# Patient Record
Sex: Female | Born: 1945 | Race: Black or African American | Hispanic: No | Smoking: Former smoker
Health system: Southern US, Community
[De-identification: ages and names within clinical notes are randomized; demographics above are authoritative.]

## PROBLEM LIST (undated history)

## (undated) DIAGNOSIS — IMO0001 Reserved for inherently not codable concepts without codable children: Secondary | ICD-10-CM

## (undated) DIAGNOSIS — D649 Anemia, unspecified: Secondary | ICD-10-CM

## (undated) DIAGNOSIS — E119 Type 2 diabetes mellitus without complications: Secondary | ICD-10-CM

## (undated) DIAGNOSIS — E559 Vitamin D deficiency, unspecified: Secondary | ICD-10-CM

## (undated) DIAGNOSIS — I10 Essential (primary) hypertension: Secondary | ICD-10-CM

## (undated) HISTORY — DX: Reserved for inherently not codable concepts without codable children: IMO0001

## (undated) HISTORY — PX: OTHER SURGICAL HISTORY: SHX169

## (undated) HISTORY — PX: BACK SURGERY: SHX140

## (undated) HISTORY — DX: Vitamin D deficiency, unspecified: E55.9

---

## 2010-10-23 ENCOUNTER — Encounter: Payer: Self-pay | Admitting: Family Medicine

## 2010-10-23 ENCOUNTER — Ambulatory Visit (INDEPENDENT_AMBULATORY_CARE_PROVIDER_SITE_OTHER): Payer: Medicare Other | Admitting: Family Medicine

## 2010-10-23 VITALS — BP 162/72 | HR 73 | Ht 65.0 in | Wt 165.0 lb

## 2010-10-23 DIAGNOSIS — M25552 Pain in left hip: Secondary | ICD-10-CM

## 2010-10-23 DIAGNOSIS — M25551 Pain in right hip: Secondary | ICD-10-CM

## 2010-10-23 DIAGNOSIS — M48062 Spinal stenosis, lumbar region with neurogenic claudication: Secondary | ICD-10-CM

## 2010-10-23 DIAGNOSIS — M25559 Pain in unspecified hip: Secondary | ICD-10-CM

## 2010-10-23 DIAGNOSIS — M549 Dorsalgia, unspecified: Secondary | ICD-10-CM

## 2010-10-23 MED ORDER — OXYCODONE HCL 15 MG PO TABS: ORAL_TABLET | ORAL | Status: DC

## 2010-10-23 NOTE — Patient Instructions (Signed)
Your appt is going to be: Monday, August 13th at [id number] Dr Ancil Linsey [id number] [place] [id number] [phone] MAKE SURE YOU BRINGS YOUR MRI DISC OR THEY WILL NOT SEE YOU Also bring insurance info along with your medicines

## 2010-10-24 ENCOUNTER — Encounter: Payer: Self-pay | Admitting: Family Medicine

## 2010-10-24 NOTE — Progress Notes (Signed)
  Subjective:    Patient ID: Barbara Yu, female    DOB: 1945-06-18, 65 y.o.   MRN: [id number]  HPI Here for evaluation of hip and  low back pain, worse on right--radiates into right hip. Has had hip and back mri and brings her radiology discs.  Pain in back longstanding and had back surgery  In 2005. (interbody fusion). That relieved her pain for a long time but in last 2 year increase in low back pain. In last 6 months is having significant pain if she walks more than 5-10 minutes--feels she has to sit down as legs do not "work" as well and pain is intense. Pain starts in low back and and is congruent with bilateral( but usually worse RIGHt) hip pain,   Aching in quality with sharp shooting pain into right thighNo incontinence. Is unable to do her ADLs secondary to inability to stand or walk > 10 minutes at a time. When she sits down, it takes 25 minutes or so for pain to improve enough for her to resume activity. She is also having a lot of difficulty sleeping.  Bilateral but Right> left  hip is also painful and she is not sure if this is coming from back or a separate issue. No hip injury or surgegry. Hip pain worse if she is lying down. No giving way of her legs.   PERTINENT  PMH / PSH: Lumbar fusion with interbody L4-5.  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 Presence of hardware L45 Review of Systems Pertinent review of systems: negative for fever or unusual weight change. Denies urinary or fecal incontinence.  See HPI for additional ROS.    Objective:   Physical Exam GENERAL : WDWN NAD Limited abillity to sit without pain  BACK:  Nontender to palpation or percussion. No obvious defect. Limited flexion at hips to about 25 degrees. SLR  positive on right HIP: limited IR on right c/w left. Painful IR right. TTP greater trochanteric bursa. Full strength B lower extremity at hip flexors / knee flexors / knee extensors  DTR s0- 1+ B= knee GAIT: antalgic. Turns with several steps needed rather than with single step.No trendelenburg  INJECTION: Patient was given informed consent, signed copy in the chart. Appropriate time out was taken. Area prepped and draped in usual sterile fashion. 1 cc of kenalog plus  4 cc of lidocaine was injected into the right greater trochanteric bursa using a(n) lateral approach. The patient tolerated the procedure well. There were no complications. Post procedure instructions were given.    Assessment & Plan:  1: low back pain, hx prior fusion. Concerning for worsening spinal stenosis. We discussed and she  Agrees to see [facility] for further eval and management 2. Bilateral but R>L hip pain. From positive result of lidocaine injection today I suspect thre  Is some component of bursitis. She also has some limits to IR so arthritic changes (possibly a labral issue) are a component as well. I wonder if most of this is coming from her back. For pain I will give her some oxycodone. Refer her to [facility] of her choice.

## 2010-11-06 ENCOUNTER — Other Ambulatory Visit: Payer: Self-pay | Admitting: *Deleted

## 2010-11-06 MED ORDER — MORPHINE SULFATE 30 MG PO TB12: 30.0000 mg | ORAL_TABLET | Freq: Four times a day (QID) | ORAL | Status: DC

## 2010-11-06 MED ORDER — MORPHINE SULFATE 30 MG PO TB12: ORAL_TABLET | ORAL | Status: DC

## 2011-02-21 LAB — HM COLONOSCOPY

## 2011-02-27 LAB — HM COLONOSCOPY

## 2012-01-02 ENCOUNTER — Ambulatory Visit (INDEPENDENT_AMBULATORY_CARE_PROVIDER_SITE_OTHER): Payer: Medicare Other | Admitting: Family Medicine

## 2012-01-02 ENCOUNTER — Encounter: Payer: Self-pay | Admitting: Family Medicine

## 2012-01-02 VITALS — BP 140/69 | HR 85 | Temp 98.0°F | Ht 65.0 in | Wt 163.0 lb

## 2012-01-02 DIAGNOSIS — M549 Dorsalgia, unspecified: Secondary | ICD-10-CM

## 2012-01-02 DIAGNOSIS — G8929 Other chronic pain: Secondary | ICD-10-CM

## 2012-01-02 DIAGNOSIS — M25552 Pain in left hip: Secondary | ICD-10-CM

## 2012-01-02 DIAGNOSIS — M25559 Pain in unspecified hip: Secondary | ICD-10-CM

## 2012-01-02 DIAGNOSIS — M25551 Pain in right hip: Secondary | ICD-10-CM

## 2012-01-03 DIAGNOSIS — M25551 Pain in right hip: Secondary | ICD-10-CM | POA: Insufficient documentation

## 2012-01-03 DIAGNOSIS — M160 Bilateral primary osteoarthritis of hip: Secondary | ICD-10-CM

## 2012-01-03 DIAGNOSIS — M961 Postlaminectomy syndrome, not elsewhere classified: Secondary | ICD-10-CM

## 2012-01-03 HISTORY — DX: Pain in right hip: M25.551

## 2012-01-03 HISTORY — DX: Postlaminectomy syndrome, not elsewhere classified: M96.1

## 2012-01-03 HISTORY — DX: Bilateral primary osteoarthritis of hip: M16.0

## 2012-01-03 NOTE — Progress Notes (Signed)
  Subjective:    Patient ID: Barbara Yu, female    DOB: 1945-09-08, 66 y.o.   MRN: [id number]  HPI Plan if worsening bilateral but right greater than left hip pain. Has been seen by orthopedics and they did MRI of both hips. Report shows that she has a partial posterior labral tear on the right. The injection I gave her over a year ago really helped for many months. She would like to consider bilateral injections today.  Chronic low back and hip pain. It has been difficult for her to determine what part of this is coming from her back in one part is coming from her hips. She is being followed by pain clinic right now. She would like to consider some names of people for second opinion on both her back and her hip.   Review of Systems Denies leg numbness, leg weakness, incontinence. Denies unusual weight change, fever.    Objective:   Physical Exam  GENERAL: Well-developed female no acute distress vital signs are reviewed HIPS: Internal rotation normal bilaterally external rotation mildly limited on the right. Axial loading of the posterior labrum increases her pain on the right. She is tender to palpation bilaterally right greater than left over the greater trochanteric bursa.  INJECTION: Patient was given informed consent, signed copy in the chart. Appropriate time out was taken. Area prepped and draped in usual sterile fashion. One cc of methylprednisolone 40 mg/ml plus  3 cc of 1% lidocaine without epinephrine was injected into the bilateral greater trochanteric bursa using a(n) perpendicular approach. The patient tolerated the procedure well. There were no complications. Post procedure instructions were given.       Assessment & Plan:  #1. Hip pain. I gave her bilateral corticosteroid injections in the greater trochanteric bursa today. I do think this is the source of part of her pain. Am unclear whether not the labral issue identified on MRI is contributing to the right hip pain or  not. I did give her the names of several people to see. #2. Low back pain that is chronic. I reviewed her MRI with her. She has seen one surgeon would like a second opinion so I gave her some names. She will followup when necessary

## 2012-02-25 ENCOUNTER — Other Ambulatory Visit: Payer: Self-pay | Admitting: Family Medicine

## 2012-02-25 DIAGNOSIS — M25552 Pain in left hip: Secondary | ICD-10-CM

## 2012-02-25 DIAGNOSIS — M25551 Pain in right hip: Secondary | ICD-10-CM

## 2012-02-25 DIAGNOSIS — M549 Dorsalgia, unspecified: Secondary | ICD-10-CM

## 2012-02-25 DIAGNOSIS — G8929 Other chronic pain: Secondary | ICD-10-CM

## 2012-02-26 ENCOUNTER — Other Ambulatory Visit: Payer: Self-pay | Admitting: Family Medicine

## 2012-02-28 ENCOUNTER — Other Ambulatory Visit: Payer: Self-pay | Admitting: Family Medicine

## 2012-02-28 DIAGNOSIS — M25552 Pain in left hip: Secondary | ICD-10-CM

## 2012-02-28 DIAGNOSIS — M25551 Pain in right hip: Secondary | ICD-10-CM

## 2012-02-28 DIAGNOSIS — M4800 Spinal stenosis, site unspecified: Secondary | ICD-10-CM

## 2012-06-03 DIAGNOSIS — E039 Hypothyroidism, unspecified: Secondary | ICD-10-CM

## 2012-06-03 DIAGNOSIS — K219 Gastro-esophageal reflux disease without esophagitis: Secondary | ICD-10-CM | POA: Insufficient documentation

## 2012-06-03 DIAGNOSIS — E118 Type 2 diabetes mellitus with unspecified complications: Secondary | ICD-10-CM | POA: Insufficient documentation

## 2012-06-03 DIAGNOSIS — E119 Type 2 diabetes mellitus without complications: Secondary | ICD-10-CM | POA: Insufficient documentation

## 2012-06-03 DIAGNOSIS — I10 Essential (primary) hypertension: Secondary | ICD-10-CM | POA: Insufficient documentation

## 2012-06-03 DIAGNOSIS — E1122 Type 2 diabetes mellitus with diabetic chronic kidney disease: Secondary | ICD-10-CM

## 2012-06-03 DIAGNOSIS — N184 Chronic kidney disease, stage 4 (severe): Secondary | ICD-10-CM

## 2012-06-03 HISTORY — DX: Type 2 diabetes mellitus with diabetic chronic kidney disease: E11.22

## 2012-06-03 HISTORY — DX: Hypothyroidism, unspecified: E03.9

## 2012-06-03 HISTORY — DX: Type 2 diabetes mellitus with diabetic chronic kidney disease: N18.4

## 2012-06-03 HISTORY — DX: Gastro-esophageal reflux disease without esophagitis: K21.9

## 2012-06-03 HISTORY — DX: Essential (primary) hypertension: I10

## 2012-06-09 DIAGNOSIS — IMO0001 Reserved for inherently not codable concepts without codable children: Secondary | ICD-10-CM

## 2012-06-09 HISTORY — DX: Reserved for inherently not codable concepts without codable children: IMO0001

## 2012-06-12 DIAGNOSIS — R202 Paresthesia of skin: Secondary | ICD-10-CM

## 2012-06-12 DIAGNOSIS — D62 Acute posthemorrhagic anemia: Secondary | ICD-10-CM | POA: Insufficient documentation

## 2012-06-12 DIAGNOSIS — M47816 Spondylosis without myelopathy or radiculopathy, lumbar region: Secondary | ICD-10-CM | POA: Insufficient documentation

## 2012-06-12 DIAGNOSIS — R531 Weakness: Secondary | ICD-10-CM | POA: Insufficient documentation

## 2012-06-12 HISTORY — DX: Weakness: R53.1

## 2012-06-12 HISTORY — DX: Paresthesia of skin: R20.2

## 2012-06-13 ENCOUNTER — Encounter: Payer: Self-pay | Admitting: Family Medicine

## 2012-06-13 NOTE — Progress Notes (Signed)
Patient ID: Barbara Yu, female   DOB: 1945/05/01, 67 y.o.   MRN: [id number] Recd records from [facility]--normal cardiac stress test, other labs normal with notables : Platelts 126 Creatinine1.6 and glucose 150

## 2012-06-24 ENCOUNTER — Encounter: Payer: Self-pay | Admitting: Family Medicine

## 2012-06-24 DIAGNOSIS — G8929 Other chronic pain: Secondary | ICD-10-CM

## 2012-06-24 NOTE — Progress Notes (Signed)
Patient ID: Barbara Yu, female   DOB: 01-30-1946, 67 y.o.   MRN: [id number] Back surgery by Dr Prince Rome at [facility] 06/11/2012.  L3-S1 PSF, L3-4 and L5-S1 bilateral decompression wit stryker, s/p PLIF stryker

## 2012-10-16 DIAGNOSIS — M5137 Other intervertebral disc degeneration, lumbosacral region: Secondary | ICD-10-CM | POA: Insufficient documentation

## 2012-10-16 DIAGNOSIS — M51379 Other intervertebral disc degeneration, lumbosacral region without mention of lumbar back pain or lower extremity pain: Secondary | ICD-10-CM | POA: Insufficient documentation

## 2012-10-16 DIAGNOSIS — Z981 Arthrodesis status: Secondary | ICD-10-CM | POA: Insufficient documentation

## 2012-10-16 HISTORY — DX: Arthrodesis status: Z98.1

## 2013-02-12 DIAGNOSIS — D696 Thrombocytopenia, unspecified: Secondary | ICD-10-CM

## 2013-02-12 HISTORY — DX: Thrombocytopenia, unspecified: D69.6

## 2014-06-24 DIAGNOSIS — E559 Vitamin D deficiency, unspecified: Secondary | ICD-10-CM | POA: Insufficient documentation

## 2015-09-30 LAB — HM DEXA SCAN

## 2016-04-13 DIAGNOSIS — M5417 Radiculopathy, lumbosacral region: Secondary | ICD-10-CM | POA: Insufficient documentation

## 2016-05-21 DIAGNOSIS — J209 Acute bronchitis, unspecified: Secondary | ICD-10-CM | POA: Insufficient documentation

## 2016-06-26 LAB — BASIC METABOLIC PANEL
BUN: 18 (ref 4–21)
CREATININE: 1.5 — AB (ref 0.5–1.1)
GLUCOSE: 179
SODIUM: 138 (ref 137–147)

## 2016-06-26 LAB — HEPATIC FUNCTION PANEL
ALK PHOS: 50 (ref 25–125)
ALT: 67 — AB (ref 7–35)
AST: 113 — AB (ref 13–35)
Bilirubin, Total: 0.3

## 2016-06-28 DIAGNOSIS — S32009K Unspecified fracture of unspecified lumbar vertebra, subsequent encounter for fracture with nonunion: Secondary | ICD-10-CM

## 2016-06-28 HISTORY — DX: Unspecified fracture of unspecified lumbar vertebra, subsequent encounter for fracture with nonunion: S32.009K

## 2016-09-27 ENCOUNTER — Ambulatory Visit: Payer: Medicare Other | Admitting: Family Medicine

## 2016-10-02 DIAGNOSIS — J309 Allergic rhinitis, unspecified: Secondary | ICD-10-CM | POA: Insufficient documentation

## 2016-10-02 DIAGNOSIS — G629 Polyneuropathy, unspecified: Secondary | ICD-10-CM | POA: Insufficient documentation

## 2016-10-02 DIAGNOSIS — D509 Iron deficiency anemia, unspecified: Secondary | ICD-10-CM | POA: Insufficient documentation

## 2016-10-02 DIAGNOSIS — M1990 Unspecified osteoarthritis, unspecified site: Secondary | ICD-10-CM | POA: Insufficient documentation

## 2016-10-02 HISTORY — DX: Allergic rhinitis, unspecified: J30.9

## 2016-10-02 HISTORY — DX: Polyneuropathy, unspecified: G62.9

## 2016-10-02 HISTORY — DX: Unspecified osteoarthritis, unspecified site: M19.90

## 2016-10-02 HISTORY — DX: Iron deficiency anemia, unspecified: D50.9

## 2016-10-03 ENCOUNTER — Encounter: Payer: Self-pay | Admitting: Family Medicine

## 2016-10-03 ENCOUNTER — Ambulatory Visit (INDEPENDENT_AMBULATORY_CARE_PROVIDER_SITE_OTHER): Payer: Medicare Other | Admitting: Family Medicine

## 2016-10-03 VITALS — BP 136/76 | HR 87 | Ht 64.17 in | Wt 162.0 lb

## 2016-10-03 DIAGNOSIS — M1A0790 Idiopathic chronic gout, unspecified ankle and foot, without tophus (tophi): Secondary | ICD-10-CM

## 2016-10-03 DIAGNOSIS — E119 Type 2 diabetes mellitus without complications: Secondary | ICD-10-CM

## 2016-10-03 DIAGNOSIS — Z8639 Personal history of other endocrine, nutritional and metabolic disease: Secondary | ICD-10-CM

## 2016-10-03 DIAGNOSIS — I10 Essential (primary) hypertension: Secondary | ICD-10-CM

## 2016-10-03 DIAGNOSIS — Z794 Long term (current) use of insulin: Secondary | ICD-10-CM

## 2016-10-03 DIAGNOSIS — M109 Gout, unspecified: Secondary | ICD-10-CM

## 2016-10-03 DIAGNOSIS — E038 Other specified hypothyroidism: Secondary | ICD-10-CM

## 2016-10-03 HISTORY — DX: Gout, unspecified: M10.9

## 2016-10-03 LAB — CBC WITH DIFFERENTIAL/PLATELET
BASOS PCT: 1 %
Basophils Absolute: 80 cells/uL (ref 0–200)
EOS PCT: 4 %
Eosinophils Absolute: 320 cells/uL (ref 15–500)
HEMATOCRIT: 35.5 % (ref 35.0–45.0)
HEMOGLOBIN: 11.2 g/dL — AB (ref 11.7–15.5)
LYMPHS ABS: 3200 {cells}/uL (ref 850–3900)
Lymphocytes Relative: 40 %
MCH: 23.5 pg — ABNORMAL LOW (ref 27.0–33.0)
MCHC: 31.5 g/dL — ABNORMAL LOW (ref 32.0–36.0)
MCV: 74.4 fL — ABNORMAL LOW (ref 80.0–100.0)
Monocytes Absolute: 720 cells/uL (ref 200–950)
Monocytes Relative: 9 %
NEUTROS ABS: 3680 {cells}/uL (ref 1500–7800)
Neutrophils Relative %: 46 %
Platelets: 175 10*3/uL (ref 140–400)
RBC: 4.77 MIL/uL (ref 3.80–5.10)
RDW: 14.5 % (ref 11.0–15.0)
WBC: 8 10*3/uL (ref 3.8–10.8)

## 2016-10-03 LAB — COMPLETE METABOLIC PANEL WITH GFR
ALBUMIN: 4.5 g/dL (ref 3.6–5.1)
ALK PHOS: 76 U/L (ref 33–130)
ALT: 42 U/L — AB (ref 6–29)
AST: 71 U/L — AB (ref 10–35)
BILIRUBIN TOTAL: 0.4 mg/dL (ref 0.2–1.2)
BUN: 23 mg/dL (ref 7–25)
CO2: 23 mmol/L (ref 20–31)
CREATININE: 1.7 mg/dL — AB (ref 0.60–0.93)
Calcium: 9.5 mg/dL (ref 8.6–10.4)
Chloride: 104 mmol/L (ref 98–110)
GFR, Est African American: 34 mL/min — ABNORMAL LOW (ref >=60)
GFR, Est Non African American: 30 mL/min — ABNORMAL LOW (ref >=60)
GLUCOSE: 189 mg/dL — AB (ref 65–99)
Potassium: 5 mmol/L (ref 3.5–5.3)
SODIUM: 137 mmol/L (ref 135–146)
TOTAL PROTEIN: 7.3 g/dL (ref 6.1–8.1)

## 2016-10-03 LAB — POCT GLYCOSYLATED HEMOGLOBIN (HGB A1C): HEMOGLOBIN A1C: 7

## 2016-10-03 NOTE — Progress Notes (Signed)
Subjective:    Patient ID: Barbara Yu, female    DOB: 1946-04-05, 71 y.o.   MRN: [id number]  HPI   71 year old female comes in today to establish care. Unfortunately her physician recently left the practice and so she is in search of a new provider. She has been seeing Dr. Kate Sable for endocrinology but is hoping to transition her thyroid and diabetes to primary care as well. She's hoping to get as many things taking care under one roof as possible.  DM- she reports her Last A1C was 7.3 in March. She has recently adjusted her Humulin insulin. It sounds like she is taking about 20 units in the morning and then sometimes 10 at night. She was initially on a much higher dose but says she was having hypoglycemic events and dropping into the 40s at night. She did wake up feeling bad and sweaty. She has been doing the Tanzeum once a week and has actually been doing well with that. She's not had any known side effects. No wounds or sores that aren't healing. She was diagnosed with diabetes about 18 years ago.  Hypothyroid - Last TSH was 1.07 done on 06/26/2016. T4 was 10.1 and T3 uptake was 24%. She was diagnosed with thyroid disease about 18 years ago.  Gout - she currently takes colchicine once a day sometimes twice. She was on allopurinol at one point in time it was causing frequent flares so she discontinued it.  She thinks she could be iron deficient again. She has had problems with this in the past and reports that lately she's bee been having some unusual cravings which is similar to what she experienced in the past when she was anemic.  She currently sees Dr. Holley Dexter for her OB/GYN for her Pap smear and mammogram. Pap smear is up-to-date as well as mammogram we will call and get those records.  Review of Systems  Constitutional: Negative for diaphoresis, fever and unexpected weight change.  HENT: Positive for tinnitus. Negative for hearing loss and rhinorrhea.   Eyes: Negative for  visual disturbance.  Respiratory: Negative for cough and wheezing.   Cardiovascular: Negative for chest pain and palpitations.  Gastrointestinal: Negative for blood in stool, diarrhea, nausea and vomiting.  Genitourinary: Negative for difficulty urinating, vaginal bleeding and vaginal discharge.  Musculoskeletal: Negative for arthralgias and myalgias.  Skin: Negative for rash.  Neurological: Negative for headaches.  Hematological: Negative for adenopathy. Does not bruise/bleed easily.  Psychiatric/Behavioral: Positive for sleep disturbance. Negative for dysphoric mood. The patient is nervous/anxious.      BP 136/76   Pulse 87   Ht 5' 4.17" (1.63 m)   Wt 162 lb (73.5 kg)   SpO2 100%   BMI 27.66 kg/m     Allergies  Allergen Reactions  . Amoxicillin Diarrhea and Itching    Other reaction(s): Confusion (intolerance)  . Atorvastatin Other (See Comments)    Burning skin sensation  . Hydrochlorothiazide     Other reaction(s): Other Chronic renal failure  . Nsaids Other (See Comments)    Peptic ulcer disease  . Tramadol Nausea And Vomiting    Other reaction(s): GI Upset (intolerance)  . Morphine Other (See Comments)    Hallucinations  . Ciprofloxacin     Other reaction(s): GI Upset (intolerance) Other reaction(s): Abdominal Pain Other reaction(s): Abdominal Pain  . Gabapentin Nausea Only    Feels "stupid" and wt gain   . Pantoprazole Diarrhea    Past Medical History:  Diagnosis Date  .  Normal cardiac stress test 06/09/2012   at [facility] normal cardiac stress test (surgery preop); Dr Norman Clay    No past surgical history on file.  Social History   Social History  . Marital status: Widowed    Spouse name: N/A  . Number of children: N/A  . Years of education: N/A   Occupational History  . retired    Social History Main Topics  . Smoking status: Former Research scientist (life sciences)  . Smokeless tobacco: Never Used  . Alcohol use No  . Drug use: No  . Sexual activity: No   Other Topics  Concern  . Not on file   Social History Narrative  . No narrative on file    No family history on file.  Outpatient Encounter Prescriptions as of 10/03/2016  Medication Sig  . acetaminophen (TYLENOL) 325 MG tablet Take 650 mg by mouth as needed.  . Albiglutide 30 MG PEN Inject 30 mg into the skin once a week.  Marland Kitchen albuterol (PROVENTIL HFA;VENTOLIN HFA) 108 (90 Base) MCG/ACT inhaler Inhale 2 puffs into the lungs every 6 (six) hours as needed.  Marland Kitchen amLODipine (NORVASC) 5 MG tablet Take 5 mg by mouth daily.  Marland Kitchen ammonium lactate (LAC-HYDRIN) 12 % lotion Apply to affected area daily  . BIOTIN 5000 PO Take 1 tablet by mouth daily.  . Coenzyme Q10 (CO Q 10 PO) Take 1 capsule by mouth daily.  . colchicine 0.6 MG tablet Take 1 tablet by mouth 2 (two) times daily.  . cyanocobalamin ([employer] VITAMIN B12) 1000 MCG tablet Take 1 tablet by mouth once a week.  . diazepam (VALIUM) 5 MG tablet Take 5 mg by mouth as needed.  Marland Kitchen glimepiride (AMARYL) 4 MG tablet Take 1 tablet by mouth 2 (two) times daily.  . Glucose Blood (BLOOD GLUCOSE TEST STRIPS) STRP Use to check blood sugars three (3) times daily  DX E11.9 and Z79.4  . Insulin Isophane & Regular Human (HUMULIN 70/30 KWIKPEN) (70-30) 100 UNIT/ML PEN Inject 34 units qam and 24 units qpm  DX E11.9 and Z79.4  . Insulin Pen Needle (PEN NEEDLES 31GX5/16") 31G X 8 MM MISC Use with insulin three (3) times daily  DX E11.9 and Z79.4  . levocetirizine (XYZAL) 5 MG tablet Take 1 tablet by mouth as needed.  Marland Kitchen levothyroxine (SYNTHROID, LEVOTHROID) 50 MCG tablet Take 50 mcg by mouth 2 (two) times a week.  Marland Kitchen lisinopril (PRINIVIL,ZESTRIL) 40 MG tablet 1 tablet.  . montelukast (SINGULAIR) 10 MG tablet Take 10 mg by mouth at bedtime.  . Naproxen Sodium 220 MG CAPS Take 1-2 capsules by mouth as needed.  Marland Kitchen omeprazole (PRILOSEC) 20 MG capsule Take 1 capsule by mouth.  . oxyCODONE (OXYCONTIN) 30 MG 12 hr tablet 30 mg.  . oxyCODONE (ROXICODONE) 15 MG immediate release tablet 15 mg 4  (four) times daily.  . polyethylene glycol (MIRALAX / GLYCOLAX) packet Take 17 g by mouth as needed.  . sitaGLIPtin (JANUVIA) 25 MG tablet Take 25 mg by mouth daily.  . Turmeric Curcumin 500 MG CAPS Take 1 capsule by mouth daily.  . Vitamin D, Ergocalciferol, (DRISDOL) 50000 units CAPS capsule 1 capsule once a week.  . [DISCONTINUED] estrogen, conjugated,-medroxyprogesterone (PREMPRO) 0.625-2.5 MG tablet Take 1 tablet by mouth daily.  . [DISCONTINUED] Insulin Isophane & Regular Human (HUMULIN 70/30 MIX) (70-30) 100 UNIT/ML PEN Inject 34 units qam and 24 units qpm  DX E11.9 and Z79.4  . [DISCONTINUED] levothyroxine (SYNTHROID, LEVOTHROID) 75 MCG tablet TAKE 1 TABLET 5 DAYS A WEEK  ALTERNATING WITH 50 MCG 2 DAYS A WEEK  . [DISCONTINUED] sitaGLIPtin (JANUVIA) 50 MG tablet Take 25 mg by mouth daily.  Marland Kitchen estrogen, conjugated,-medroxyprogesterone (PREMPRO) 0.625-5 MG per tablet Take 1 tablet by mouth daily.    . Flaxseed, Linseed, (FLAXSEED OIL PO) Take by mouth daily.    Marland Kitchen levothyroxine (SYNTHROID, LEVOTHROID) 75 MCG tablet Take 75 mcg by mouth daily.    . Omega-3 Fatty Acids (FISH OIL) 1000 MG CAPS Take 5,000 mg by mouth daily.    . [DISCONTINUED] Carbonyl Iron (FEOSOL PO) Take 65 mg by mouth daily.    . [DISCONTINUED] celecoxib (CELEBREX) 200 MG capsule Take 200 mg by mouth 2 (two) times daily.    . [DISCONTINUED] Cholecalciferol (VITAMIN D3) 2000 UNITS TABS Take 2,000 Units by mouth daily.    . [DISCONTINUED] darifenacin (ENABLEX) 7.5 MG 24 hr tablet Take 7.5 mg by mouth daily.    . [DISCONTINUED] Desloratadine-Pseudoephedrine (CLARINEX-D 12 HOUR PO) Take by mouth daily.    . [DISCONTINUED] glimepiride (AMARYL) 4 MG tablet Take by mouth 2 (two) times daily. Take 6 mg in the morning and 4 mg in the evening.   . [DISCONTINUED] lisinopril (ZESTRIL) 40 MG tablet Take 40 mg by mouth daily.    . [DISCONTINUED] morphine (MS CONTIN) 30 MG 12 hr tablet Take 1-2 tablets by mouth every 12 hours.  .  [DISCONTINUED] omeprazole (PRILOSEC) 40 MG capsule Take 40 mg by mouth daily.    . [DISCONTINUED] oxyCODONE (ROXICODONE) 15 MG immediate release tablet Take on tablet by mouth every 6 hours prn pain   No facility-administered encounter medications on file as of 10/03/2016.          Objective:   Physical Exam  Constitutional: She is oriented to person, place, and time. She appears well-developed and well-nourished.  HENT:  Head: Normocephalic and atraumatic.  Neck: Neck supple. No thyromegaly present.  Cardiovascular: Normal rate, regular rhythm and normal heart sounds.   Pulmonary/Chest: Effort normal and breath sounds normal.  Lymphadenopathy:    She has no cervical adenopathy.  Neurological: She is alert and oriented to person, place, and time.  Skin: Skin is warm and dry.  Psychiatric: She has a normal mood and affect. Her behavior is normal.          Assessment & Plan:  HTN - Well controlled today. Continue current regimen. Check CMP.  DM- Improved. Hemoglobin A1c down to 7.0 today which is fantastic. We did have a discussion about trying to use her Humulin consistently and using it twice a day. Certainly she is having hypoglycemic events let us know and we may need to adjust her dose. Encouraged her not to skip the injection completely but just adjust the dose. She is on an ACE inhibitor. She cannot take atorvastatin but I did not have the opportunity to ask her she had tried any other statins.   Gout - we discussed that the best treatment is to have her on a medication such as allopurinol that actually helps remove the uric acid from the joints. It sounds like she was not given the colchicine and uric acid together for the first minute 6 months to reduce flares. Explained how typically we treat this and what the guidelines and recommendations today. She currently takes colchicine daily and is not interested in changing her regimen because she feels like it's working well for  her. Refilled her colchicine today.  Fatigue/cravings-with a prior history of iron deficiency anemia she feels like her iron is  likely low again. She said she's had this before where she started to have odd cravings. She's not currently on any iron supplementation.  Hypothyroidism-recent TSH looks fantastic and is up-to-date. She currently takes 50 g 2 days a week and 75 g the other 5 days.

## 2016-10-04 LAB — FERRITIN: Ferritin: 12 ng/mL — ABNORMAL LOW (ref 20–288)

## 2016-10-05 ENCOUNTER — Other Ambulatory Visit: Payer: Self-pay | Admitting: *Deleted

## 2016-10-05 ENCOUNTER — Encounter: Payer: Self-pay | Admitting: Family Medicine

## 2016-10-05 DIAGNOSIS — N183 Chronic kidney disease, stage 3 (moderate): Secondary | ICD-10-CM

## 2016-10-05 DIAGNOSIS — N184 Chronic kidney disease, stage 4 (severe): Secondary | ICD-10-CM

## 2016-10-05 HISTORY — DX: Chronic kidney disease, stage 4 (severe): N18.4

## 2016-10-05 MED ORDER — COLCHICINE 0.6 MG PO TABS: 0.6000 mg | ORAL_TABLET | Freq: Two times a day (BID) | ORAL | 3 refills | Status: DC

## 2016-10-17 ENCOUNTER — Encounter: Payer: Self-pay | Admitting: Family Medicine

## 2016-11-07 LAB — HM DIABETES EYE EXAM

## 2016-11-15 ENCOUNTER — Other Ambulatory Visit: Payer: Self-pay | Admitting: *Deleted

## 2016-11-15 MED ORDER — ALBIGLUTIDE 30 MG ~~LOC~~ PEN: 30.0000 mg | PEN_INJECTOR | SUBCUTANEOUS | 3 refills | Status: DC

## 2016-11-15 MED ORDER — MONTELUKAST SODIUM 10 MG PO TABS: 10.0000 mg | ORAL_TABLET | Freq: Every day | ORAL | 3 refills | Status: DC

## 2016-11-15 MED ORDER — LISINOPRIL 40 MG PO TABS: 40.0000 mg | ORAL_TABLET | Freq: Every day | ORAL | 3 refills | Status: DC

## 2016-11-15 MED ORDER — VITAMIN D (ERGOCALCIFEROL) 1.25 MG (50000 UNIT) PO CAPS: 50000.0000 [IU] | ORAL_CAPSULE | ORAL | 1 refills | Status: DC

## 2016-11-15 MED ORDER — AMLODIPINE BESYLATE 5 MG PO TABS: 5.0000 mg | ORAL_TABLET | Freq: Every day | ORAL | 3 refills | Status: DC

## 2016-11-15 MED ORDER — SITAGLIPTIN PHOSPHATE 25 MG PO TABS: 25.0000 mg | ORAL_TABLET | Freq: Every day | ORAL | 3 refills | Status: DC

## 2016-11-15 MED ORDER — LEVOTHYROXINE SODIUM 75 MCG PO TABS: 75.0000 ug | ORAL_TABLET | Freq: Every day | ORAL | 3 refills | Status: DC

## 2016-11-15 NOTE — Telephone Encounter (Signed)
Refills sent.Howat, Kista Robb Lynetta  

## 2016-11-19 ENCOUNTER — Telehealth: Payer: Self-pay

## 2016-11-19 NOTE — Telephone Encounter (Signed)
Tanzeum requires a PA. Sent through CoverMyMeds.

## 2016-11-21 NOTE — Telephone Encounter (Signed)
PA approved. Called pharmacy and patient.

## 2016-11-27 ENCOUNTER — Other Ambulatory Visit: Payer: Self-pay | Admitting: *Deleted

## 2016-11-28 ENCOUNTER — Other Ambulatory Visit: Payer: Self-pay | Admitting: *Deleted

## 2016-11-28 DIAGNOSIS — N183 Chronic kidney disease, stage 3 unspecified: Secondary | ICD-10-CM

## 2016-11-28 DIAGNOSIS — R790 Abnormal level of blood mineral: Secondary | ICD-10-CM

## 2016-11-28 DIAGNOSIS — R748 Abnormal levels of other serum enzymes: Secondary | ICD-10-CM

## 2016-11-28 LAB — HEMOGLOBIN: HEMOGLOBIN: 10.2 g/dL — AB (ref 11.7–15.5)

## 2016-11-29 ENCOUNTER — Encounter (INDEPENDENT_AMBULATORY_CARE_PROVIDER_SITE_OTHER): Payer: Self-pay

## 2016-11-29 ENCOUNTER — Telehealth: Payer: Self-pay | Admitting: Family Medicine

## 2016-11-29 ENCOUNTER — Ambulatory Visit (INDEPENDENT_AMBULATORY_CARE_PROVIDER_SITE_OTHER): Payer: Medicare Other | Admitting: Family Medicine

## 2016-11-29 VITALS — BP 139/88 | HR 76 | Ht 64.5 in | Wt 162.0 lb

## 2016-11-29 DIAGNOSIS — R790 Abnormal level of blood mineral: Secondary | ICD-10-CM | POA: Diagnosis not present

## 2016-11-29 DIAGNOSIS — E875 Hyperkalemia: Secondary | ICD-10-CM | POA: Diagnosis not present

## 2016-11-29 LAB — COMPLETE METABOLIC PANEL WITH GFR
ALT: 26 U/L (ref 6–29)
AST: 40 U/L — AB (ref 10–35)
Albumin: 4.3 g/dL (ref 3.6–5.1)
Alkaline Phosphatase: 57 U/L (ref 33–130)
BUN: 24 mg/dL (ref 7–25)
CHLORIDE: 108 mmol/L (ref 98–110)
CO2: 20 mmol/L (ref 20–32)
Calcium: 9.5 mg/dL (ref 8.6–10.4)
Creat: 1.78 mg/dL — ABNORMAL HIGH (ref 0.60–0.93)
GFR, Est African American: 32 mL/min — ABNORMAL LOW (ref >=60)
GFR, Est Non African American: 28 mL/min — ABNORMAL LOW (ref >=60)
GLUCOSE: 103 mg/dL — AB (ref 65–99)
POTASSIUM: 6.7 mmol/L — AB (ref 3.5–5.3)
SODIUM: 137 mmol/L (ref 135–146)
Total Bilirubin: 0.3 mg/dL (ref 0.2–1.2)
Total Protein: 6.7 g/dL (ref 6.1–8.1)

## 2016-11-29 LAB — VITAMIN B12: Vitamin B-12: 613 pg/mL (ref 200–1100)

## 2016-11-29 LAB — BASIC METABOLIC PANEL
BUN: 20 mg/dL (ref 7–25)
CHLORIDE: 103 mmol/L (ref 98–110)
CO2: 20 mmol/L (ref 20–32)
CREATININE: 1.79 mg/dL — AB (ref 0.60–0.93)
Calcium: 9.7 mg/dL (ref 8.6–10.4)
Glucose, Bld: 126 mg/dL — ABNORMAL HIGH (ref 65–99)
POTASSIUM: 4.6 mmol/L (ref 3.5–5.3)
Sodium: 136 mmol/L (ref 135–146)

## 2016-11-29 LAB — FERRITIN: FERRITIN: 21 ng/mL (ref 20–288)

## 2016-11-29 NOTE — Telephone Encounter (Signed)
Spoke with Barbara Yu.  She will call pt for scheduling -EH/RMA

## 2016-11-29 NOTE — Telephone Encounter (Signed)
The after hours call service called with a critical value potassium of 6.7. They had already called [facility] phone and spoke with her daughter who said that she would not take her mother to the hospital tonight. She will discuss the situation with Dr Madilyn Fireman tomorrow.   We will contact the patient in the morning.

## 2016-11-29 NOTE — Progress Notes (Addendum)
   Subjective:    Patient ID: Barbara Yu, female    DOB: 1945/09/12, 71 y.o.   MRN: [id number]  HPI 71 yo female comes in today for elevated potassium level. Critical value of 6.7 came back last night. She came in today to a check.  She denies any CP, SOB, or palpitations.  She feels well today . Though she has been more tired than usual for the last coupule of weeks but also started PT about 2 weeks ago.     Review of Systems     Objective:   Physical Exam  Constitutional: She is oriented to person, place, and time. She appears well-developed and well-nourished.  HENT:  Head: Normocephalic and atraumatic.  Cardiovascular: Normal rate, regular rhythm and normal heart sounds.   Pulmonary/Chest: Effort normal and breath sounds normal.  Neurological: She is alert and oriented to person, place, and time.  Skin: Skin is warm and dry.  Psychiatric: She has a normal mood and affect. Her behavior is normal.          Assessment & Plan:  Hyperkalemia - Asymptomatic.  EKG is normal. Recheck potassium with stat lab.  Will call with results once back.  EKG shows rate of 77 bpm, normal sinus rhythm with no acute ST-T wave changes.  Low ferritin. Coming up nicely form a month ago.

## 2016-11-29 NOTE — Progress Notes (Signed)
Patient was in the office for elevated Potassium level. EKG was done. Patient denied any chest pain, dizziness or shortness of breath. Rhonda Cunningham,CMA

## 2016-11-30 ENCOUNTER — Ambulatory Visit: Payer: Medicare Other | Admitting: Family Medicine

## 2016-11-30 ENCOUNTER — Other Ambulatory Visit: Payer: Self-pay | Admitting: *Deleted

## 2016-11-30 DIAGNOSIS — D649 Anemia, unspecified: Secondary | ICD-10-CM

## 2016-11-30 LAB — HEMOGLOBIN: HEMOGLOBIN: 11.3 g/dL — AB (ref 11.7–15.5)

## 2016-12-05 ENCOUNTER — Other Ambulatory Visit: Payer: Self-pay | Admitting: Family Medicine

## 2016-12-05 MED ORDER — OMEPRAZOLE 20 MG PO CPDR: 20.0000 mg | DELAYED_RELEASE_CAPSULE | Freq: Every day | ORAL | 3 refills | Status: DC

## 2016-12-05 NOTE — Progress Notes (Signed)
Refill send for omeprazole.

## 2016-12-12 ENCOUNTER — Other Ambulatory Visit: Payer: Self-pay

## 2016-12-12 MED ORDER — AMLODIPINE BESYLATE 5 MG PO TABS: 5.0000 mg | ORAL_TABLET | Freq: Every day | ORAL | 1 refills | Status: DC

## 2016-12-12 MED ORDER — GLIMEPIRIDE 4 MG PO TABS: 4.0000 mg | ORAL_TABLET | Freq: Two times a day (BID) | ORAL | 1 refills | Status: DC

## 2016-12-14 ENCOUNTER — Other Ambulatory Visit: Payer: Self-pay | Admitting: Family Medicine

## 2016-12-14 MED ORDER — INSULIN ISOPHANE & REGULAR (HUMAN 70-30)100 UNIT/ML KWIKPEN: PEN_INJECTOR | SUBCUTANEOUS | 3 refills | Status: DC

## 2016-12-21 ENCOUNTER — Other Ambulatory Visit: Payer: Self-pay | Admitting: *Deleted

## 2016-12-21 MED ORDER — ALBIGLUTIDE 30 MG ~~LOC~~ PEN: 30.0000 mg | PEN_INJECTOR | SUBCUTANEOUS | 3 refills | Status: DC

## 2017-01-09 ENCOUNTER — Ambulatory Visit: Payer: Medicare Other | Admitting: Family Medicine

## 2017-01-16 ENCOUNTER — Encounter: Payer: Self-pay | Admitting: Family Medicine

## 2017-01-16 ENCOUNTER — Ambulatory Visit (INDEPENDENT_AMBULATORY_CARE_PROVIDER_SITE_OTHER): Payer: Medicare Other | Admitting: Family Medicine

## 2017-01-16 VITALS — BP 126/60 | HR 80 | Ht 65.0 in | Wt 160.0 lb

## 2017-01-16 DIAGNOSIS — Z794 Long term (current) use of insulin: Secondary | ICD-10-CM

## 2017-01-16 DIAGNOSIS — E875 Hyperkalemia: Secondary | ICD-10-CM

## 2017-01-16 DIAGNOSIS — M7061 Trochanteric bursitis, right hip: Secondary | ICD-10-CM | POA: Diagnosis not present

## 2017-01-16 DIAGNOSIS — E1142 Type 2 diabetes mellitus with diabetic polyneuropathy: Secondary | ICD-10-CM

## 2017-01-16 DIAGNOSIS — E119 Type 2 diabetes mellitus without complications: Secondary | ICD-10-CM | POA: Diagnosis not present

## 2017-01-16 DIAGNOSIS — M7062 Trochanteric bursitis, left hip: Secondary | ICD-10-CM

## 2017-01-16 LAB — BASIC METABOLIC PANEL WITH GFR
BUN/Creatinine Ratio: 12 (calc) (ref 6–22)
BUN: 22 mg/dL (ref 7–25)
CALCIUM: 9.2 mg/dL (ref 8.6–10.4)
CO2: 21 mmol/L (ref 20–32)
CREATININE: 1.8 mg/dL — AB (ref 0.60–0.93)
Chloride: 106 mmol/L (ref 98–110)
GFR, EST AFRICAN AMERICAN: 32 mL/min/{1.73_m2} — AB (ref >=60)
GFR, EST NON AFRICAN AMERICAN: 28 mL/min/{1.73_m2} — AB (ref >=60)
Glucose, Bld: 115 mg/dL — ABNORMAL HIGH (ref 65–99)
POTASSIUM: 3.9 mmol/L (ref 3.5–5.3)
Sodium: 137 mmol/L (ref 135–146)

## 2017-01-16 LAB — POCT GLYCOSYLATED HEMOGLOBIN (HGB A1C): Hemoglobin A1C: 7.8

## 2017-01-16 NOTE — Progress Notes (Signed)
Subjective:    CC: DM, bilat hip pain  HPI:   Diabetes - no hypoglycemic events. No wounds or sores that are not healing well. No increased thirst or urination. Checking glucose at home. Taking medications as prescribed without any side effects.She admits she hasn't been eating the best. She hasn't had much appetite says sometimes will just grab something which isn't always healthy to eat. She reports that she has actually not been using her insulin for almost 2 months. Because of the decreased appetite she was worried that taking the insulin would be too much but she has been taking her oral diabetic pills.   bilat hip trochanteric bursitis - She had prior back surgery earlier this year and is been going to physical therapy for several months. She's actually been doing the pole therapy. After mentioning that she was having some pain on the outer parts of her head she was examined and they told her that she likely had trochanteric bursitis bilaterally. Her physical therapist recommended that she come to her PCP to discuss possible injections for pain relief. It's more painful the more active she is. It is tender to sleep on her sides. She says some days it's even hard to lift up her need to put a sock on that she was able to do it today.  Peripheral neuropathy-she is intolerant to gabapentin but had been on Lyrica previously and did well with it. Unfortunately he is going to be expensive with her current insurance plan she would like [place] to request a tear extension. Mostly the neuropathy affects her toes and causes significant pain in her toes. Some weeks it's more severe than others.  Past medical history, Surgical history, Family history not pertinant except as noted below, Social history, Allergies, and medications have been entered into the medical record, reviewed, and corrections made.   Review of Systems: No fevers, chills, night sweats, weight loss, chest pain, or shortness of breath.    Objective:    General: Well Developed, well nourished, and in no acute distress.  Neuro: Alert and oriented x3, extra-ocular muscles intact, sensation grossly intact.  HEENT: Normocephalic, atraumatic  Skin: Warm and dry, no rashes. Cardiac: Regular rate and rhythm, no murmurs rubs or gallops, no lower extremity edema.  Respiratory: Clear to auscultation bilaterally. Not using accessory muscles, speaking in full sentences.   Impression and Recommendations:    DM- uncontrolled. A1C of 7.8 today.  I did encourage her to get back on her insulin. Even if she's not eating as much we can always adjust her dose if needed. In fact she can start with 10 units less than what she is currently giving for the next couple days, monitor her sugar and then adjust slightly upwards. Since we did give her bilateral steroid injections in her trochanteric bursa today she will definitely need to use the insulin. I warned her that the injections will definitely cause a bump in her glucose levels over the next week but then it should get back down to normal. Otherwise follow-up in 3 months.  Trochanteric bursitis, bilateral-discussed treatment options. Recommendation for bilateral injection so that she can continue to do physical therapy for her back. I don't want the pain in her hips to limit her ability to exercise. Additional information and handout provided. Patient agreed to injections.  Neuropathy-we'll see if we can apply for tear extension for her Lyrica.  Hyperkalemia-recently had an abnormal bump and potassium. Repeat was normal. I like to recheck it again just  to make sure that it's stable.  Aspiration/Injection Procedure Note Barbara Yu [id number] 1945/05/01  Procedure: Injection Indications: Inflammation and pain and bilateral trochanteric bursa  Procedure Details Consent: Risks of procedure as well as the alternatives and risks of each were explained to the (patient/caregiver).  Consent  for procedure obtained. Time Out: Verified patient identification, verified procedure, site/side was marked, verified correct patient position, special equipment/implants available, medications/allergies/relevent history reviewed, required imaging and test results available.  Performed   Local Anesthesia Used: ethyl chloride spray.  Amount of Fluid Aspirated: Lidocaine 1% plain; 22mL Character of Fluid: clear Injection: 9 ml of lidocain 1% and 1 mL of 40 mg Kenalog. A sterile dressing was applied.  Patient did tolerate procedure well. Estimated blood loss: None  Barbara Yu 01/16/2017, 10:38 AM

## 2017-01-16 NOTE — Patient Instructions (Addendum)
Hip Bursitis Hip bursitis is inflammation of a fluid-filled sac (bursa) in the hip joint. The bursa protects the bones in the hip joint from rubbing against each other. Hip bursitis can cause mild to moderate pain, and symptoms often come and go over time. What are the causes? This condition may be caused by:  Injury to the hip.  Overuse of the muscles that surround the hip joint.  Arthritis or gout.  Diabetes.  Thyroid disease.  Cold weather.  Infection.  In some cases, the cause may not be known. What are the signs or symptoms? Symptoms of this condition may include:  Mild or moderate pain in the hip area. Pain may get worse with movement.  Tenderness and swelling of the hip, especially on the outer side of the hip.  Symptoms may come and go. If the bursa becomes infected, you may have the following symptoms:  Fever.  Red skin and a feeling of warmth in the hip area.  How is this diagnosed? This condition may be diagnosed based on:  A physical exam.  Your medical history.  X-rays.  Removal of fluid from your inflamed bursa for testing (biopsy).  You may be sent to a health care provider who specializes in bone diseases (orthopedist) or a provider who specializes in joint inflammation (rheumatologist). How is this treated? This condition is treated by resting, raising (elevating), and applying pressure(compression) to the injured area. In some cases, this may be enough to make your symptoms go away. Treatment may also include:  Crutches.  Antibiotic medicine.  Draining fluid out of the bursa to help relieve swelling.  Injecting medicine that helps to reduce inflammation (cortisone).  Follow these instructions at home: Medicines  Take over-the-counter and prescription medicines only as told by your health care provider.  Do not drive or operate heavy machinery while taking prescription pain medicine, or as told by your health care provider.  If you were  prescribed an antibiotic, take it as told by your health care provider. Do not stop taking the antibiotic even if you start to feel better. Activity  Return to your normal activities as told by your health care provider. Ask your health care provider what activities are safe for you.  Rest and protect your hip as much as possible until your pain and swelling get better. General instructions  Wear compression wraps only as told by your health care provider.  Elevate your hip above the level of your heart as much as you can without pain. To do this, try putting a pillow under your hips while you lie down.  Do not use your hip to support your body weight until your health care provider says that you can. Use crutches as told by your health care provider.  Gently massage and stretch your injured area as often as is comfortable.  Keep all follow-up visits as told by your health care provider. This is important. How is this prevented?  Exercise regularly, as told by your health care provider.  Warm up and stretch before being active.  Cool down and stretch after being active.  If an activity irritates your hip or causes pain, avoid the activity as much as possible.  Avoid sitting down for long periods at a time. Contact a health care provider if:  You have a fever.  You develop new symptoms.  You have difficulty walking or doing everyday activities.  You have pain that gets worse or does not get better with medicine.  You   develop red skin or a feeling of warmth in your hip area. Get help right away if:  You cannot move your hip.  You have severe pain. This information is not intended to replace advice given to you by your health care provider. Make sure you discuss any questions you have with your health care provider. Document Released: 09/29/2001 Document Revised: 09/15/2015 Document Reviewed: 11/09/2014 Elsevier Interactive Patient Education  2018 Elsevier Inc.  

## 2017-02-21 ENCOUNTER — Other Ambulatory Visit: Payer: Self-pay | Admitting: *Deleted

## 2017-02-21 MED ORDER — "PEN NEEDLES 5/16"" 31G X 8 MM MISC": 4 refills | Status: DC

## 2017-02-28 ENCOUNTER — Telehealth: Payer: Self-pay

## 2017-02-28 NOTE — Telephone Encounter (Signed)
Patient called and left a message on nurse line asking for a return call.   Returned Call: Left message asking patient to call back.  

## 2017-03-01 ENCOUNTER — Other Ambulatory Visit: Payer: Self-pay | Admitting: *Deleted

## 2017-03-01 MED ORDER — LEVOTHYROXINE SODIUM 50 MCG PO TABS: 50.0000 ug | ORAL_TABLET | ORAL | 1 refills | Status: DC

## 2017-03-01 MED ORDER — GLIMEPIRIDE 4 MG PO TABS: 4.0000 mg | ORAL_TABLET | Freq: Two times a day (BID) | ORAL | 2 refills | Status: DC

## 2017-04-12 ENCOUNTER — Other Ambulatory Visit: Payer: Self-pay | Admitting: *Deleted

## 2017-04-12 MED ORDER — BLOOD GLUCOSE TEST VI STRP: ORAL_STRIP | 4 refills | Status: DC

## 2017-04-15 LAB — HM MAMMOGRAPHY

## 2017-04-29 ENCOUNTER — Ambulatory Visit (INDEPENDENT_AMBULATORY_CARE_PROVIDER_SITE_OTHER): Payer: Medicare Other | Admitting: Family Medicine

## 2017-04-29 ENCOUNTER — Encounter: Payer: Self-pay | Admitting: Family Medicine

## 2017-04-29 ENCOUNTER — Ambulatory Visit (INDEPENDENT_AMBULATORY_CARE_PROVIDER_SITE_OTHER): Payer: Medicare Other

## 2017-04-29 VITALS — BP 133/61 | HR 92 | Ht 65.0 in | Wt 166.0 lb

## 2017-04-29 DIAGNOSIS — R238 Other skin changes: Secondary | ICD-10-CM | POA: Diagnosis not present

## 2017-04-29 DIAGNOSIS — I10 Essential (primary) hypertension: Secondary | ICD-10-CM

## 2017-04-29 DIAGNOSIS — M25551 Pain in right hip: Secondary | ICD-10-CM | POA: Diagnosis not present

## 2017-04-29 DIAGNOSIS — G8929 Other chronic pain: Secondary | ICD-10-CM

## 2017-04-29 DIAGNOSIS — E038 Other specified hypothyroidism: Secondary | ICD-10-CM | POA: Diagnosis not present

## 2017-04-29 DIAGNOSIS — Z23 Encounter for immunization: Secondary | ICD-10-CM | POA: Diagnosis not present

## 2017-04-29 DIAGNOSIS — Z794 Long term (current) use of insulin: Secondary | ICD-10-CM

## 2017-04-29 DIAGNOSIS — M25552 Pain in left hip: Secondary | ICD-10-CM

## 2017-04-29 DIAGNOSIS — E119 Type 2 diabetes mellitus without complications: Secondary | ICD-10-CM

## 2017-04-29 LAB — POCT GLYCOSYLATED HEMOGLOBIN (HGB A1C): HEMOGLOBIN A1C: 7.2

## 2017-04-29 NOTE — Patient Instructions (Addendum)

## 2017-04-29 NOTE — Progress Notes (Signed)
Subjective:    CC:   HPI: Diabetes -she does have an occasional hypoglycemic event but says they are rare and is only when she really does not eat much or when she skips eating.. No wounds or sores that are not healing well. No increased thirst or urination. Checking glucose at home. Taking medications as prescribed without any side effects. Insulin with 35 in AM, and 32 in PM.   Hypertension- Pt denies chest pain, SOB, dizziness, or heart palpitations.  Taking meds as directed w/o problems.  Denies medication side effects.    Hypothyroidism -aching medication regularly without any problems.  No significant changes in skin or hair or energy levels.  She still having bilateral hip pain.  She came and was seen for trochanteric bursitis and we did an injection in her right bursa.  She said she got significant relief for about 3 days but then it started to come back.  Her pain is bilateral and actually radiates towards the groin bilaterally.  She was told by her pain management doctor that it was likely arthritis.  She has not had any x-rays done.  She has also notice vertical ridges on her thumb nails for the last 6 months or so.   Has had her eye exam done.  Mammogram was done.   Past medical history, Surgical history, Family history not pertinant except as noted below, Social history, Allergies, and medications have been entered into the medical record, reviewed, and corrections made.   Review of Systems: No fevers, chills, night sweats, weight loss, chest pain, or shortness of breath.   Objective:    General: Well Developed, well nourished, and in no acute distress.  Neuro: Alert and oriented x3, extra-ocular muscles intact, sensation grossly intact.  HEENT: Normocephalic, atraumatic, radial pulse 2+ bilaterally, no thyromegaly Skin: Warm and dry, no rashes. Cardiac: Regular rate and rhythm, no murmurs rubs or gallops, no lower extremity edema.  Respiratory: Clear to auscultation  bilaterally. Not using accessory muscles, speaking in full sentences.   Impression and Recommendations:    DM-   much improved.  Hemoglobin A1c down to 7.2.  Just continue work on [employer] and regular exercise.  Follow-up in 3 months.  Due for CMP and lipid panel.  HTN -at goal.  Continue current management.  Hypothyroid  -due to recheck TSH.continue current regimen.   Nail ridges. Unclear etiology.   Bilateral hip pain-we will get x-rays done today. Refer to sports med for further eval of hips.

## 2017-04-30 LAB — COMPLETE METABOLIC PANEL WITH GFR
AG Ratio: 1.6 (calc) (ref 1.0–2.5)
ALKALINE PHOSPHATASE (APISO): 51 U/L (ref 33–130)
ALT: 21 U/L (ref 6–29)
AST: 33 U/L (ref 10–35)
Albumin: 4.2 g/dL (ref 3.6–5.1)
BUN / CREAT RATIO: 9 (calc) (ref 6–22)
BUN: 15 mg/dL (ref 7–25)
CO2: 23 mmol/L (ref 20–32)
CREATININE: 1.76 mg/dL — AB (ref 0.60–0.93)
Calcium: 9.4 mg/dL (ref 8.6–10.4)
Chloride: 105 mmol/L (ref 98–110)
GFR, EST NON AFRICAN AMERICAN: 28 mL/min/{1.73_m2} — AB (ref >=60)
GFR, Est African American: 33 mL/min/{1.73_m2} — ABNORMAL LOW (ref >=60)
GLOBULIN: 2.7 g/dL (ref 1.9–3.7)
Glucose, Bld: 165 mg/dL — ABNORMAL HIGH (ref 65–139)
Potassium: 4 mmol/L (ref 3.5–5.3)
SODIUM: 137 mmol/L (ref 135–146)
Total Bilirubin: 0.3 mg/dL (ref 0.2–1.2)
Total Protein: 6.9 g/dL (ref 6.1–8.1)

## 2017-04-30 LAB — LIPID PANEL
CHOLESTEROL: 190 mg/dL (ref <200)
HDL: 40 mg/dL — AB (ref >50)
Non-HDL Cholesterol (Calc): 150 mg/dL (calc) — ABNORMAL HIGH (ref <130)
Total CHOL/HDL Ratio: 4.8 (calc) (ref <5.0)
Triglycerides: 518 mg/dL — ABNORMAL HIGH (ref <150)

## 2017-04-30 LAB — TSH: TSH: 2.81 m[IU]/L (ref 0.40–4.50)

## 2017-05-03 ENCOUNTER — Encounter: Payer: Self-pay | Admitting: Family Medicine

## 2017-05-06 ENCOUNTER — Encounter: Payer: Self-pay | Admitting: Family Medicine

## 2017-05-06 ENCOUNTER — Ambulatory Visit (INDEPENDENT_AMBULATORY_CARE_PROVIDER_SITE_OTHER): Payer: Medicare Other | Admitting: Sports Medicine

## 2017-05-06 ENCOUNTER — Encounter: Payer: Self-pay | Admitting: Sports Medicine

## 2017-05-06 DIAGNOSIS — M961 Postlaminectomy syndrome, not elsewhere classified: Secondary | ICD-10-CM

## 2017-05-06 DIAGNOSIS — M160 Bilateral primary osteoarthritis of hip: Secondary | ICD-10-CM

## 2017-05-06 MED ORDER — DULOXETINE HCL 30 MG PO CPEP
30.0000 mg | ORAL_CAPSULE | Freq: Every day | ORAL | 3 refills | Status: DC
Start: 2017-05-06 — End: 2017-09-25

## 2017-05-06 NOTE — Assessment & Plan Note (Signed)
These are not the pain generators.

## 2017-05-06 NOTE — Assessment & Plan Note (Signed)
Multiple fusions with multiple revisions due to loose screws. Currently with [facility] pain management. I did explain to her that we would be unlikely to get her pain completely gone, but that we would continue to try different modalities to help. We set expectations for improvement in pain but not complete relief. She has had months and months of physical therapy, multiple surgeries, epidurals and facet joint injections that have really not been effective. She will continue her narcotic pain management with [facility]. Has not had good responses to gabapentin, holding off on Lyrica for now. Unable to use NSAIDs due to chronic kidney disease. Starting Cymbalta 30 mg. I'd also like her to consider acupuncture, chiropractic manipulation. Return in 1 month, I did ask her to also bring her imaging tests on disc for me to review.

## 2017-05-06 NOTE — Progress Notes (Signed)
Subjective:    I'm seeing this patient as a consultation for: Dr. Beatrice Lecher  CC: Low back pain  HPI: This is a pleasant 72 year old female, she has a very extensive history of lumbar surgery, fusions, revisions.  She has had multiple injections, epidurals, facets, months of physical therapy, she is on chronic narcotics, continues to have back pain.  She was recently diagnosed with hip osteoarthritis but her hips are really not a pain generator, pain is predominantly in the back with radiation to the buttocks, nothing overtly radicular.  No bowel or bladder dysfunction, saddle numbness, constitutional symptoms, she does have a spine provider at [facility] as well.  She is really unable to take NSAIDs due to chronic kidney disease, she is tried gabapentin which made her sick, has never been on an SNRI.  I reviewed the past medical history, family history, social history, surgical history, and allergies today and no changes were needed.  Please see the problem list section below in epic for further details.  Past Medical History: Past Medical History:  Diagnosis Date  . Normal cardiac stress test 06/09/2012   at [facility] normal cardiac stress test (surgery preop); Dr Norman Clay   Past Surgical History: No past surgical history on file. Social History: Social History   Socioeconomic History  . Marital status: Widowed    Spouse name: None  . Number of children: None  . Years of education: None  . Highest education level: None  Social Needs  . Financial resource strain: None  . Food insecurity - worry: None  . Food insecurity - inability: None  . Transportation needs - medical: None  . Transportation needs - non-medical: None  Occupational History  . Occupation: retired  Tobacco Use  . Smoking status: Former Research scientist (life sciences)  . Smokeless tobacco: Never Used  Substance and Sexual Activity  . Alcohol use: No  . Drug use: No  . Sexual activity: No  Other Topics Concern  .  None  Social History Narrative  . None   Family History: No family history on file. Allergies: Allergies  Allergen Reactions  . Amoxicillin Diarrhea and Itching    Other reaction(s): Confusion (intolerance)  . Atorvastatin Other (See Comments)    Burning skin sensation  . Hydrochlorothiazide     Other reaction(s): Other Chronic renal failure  . Nsaids Other (See Comments)    Peptic ulcer disease  . Tramadol Nausea And Vomiting    Other reaction(s): GI Upset (intolerance)  . Morphine Other (See Comments)    Hallucinations  . Ciprofloxacin     Other reaction(s): GI Upset (intolerance) Other reaction(s): Abdominal Pain Other reaction(s): Abdominal Pain  . Gabapentin Nausea Only    Feels "stupid" and wt gain   . Pantoprazole Diarrhea   Medications: See med rec.  Review of Systems: No headache, visual changes, nausea, vomiting, diarrhea, constipation, dizziness, abdominal pain, skin rash, fevers, chills, night sweats, weight loss, swollen lymph nodes, body aches, joint swelling, muscle aches, chest pain, shortness of breath, mood changes, visual or auditory hallucinations.   Objective:   General: Well Developed, well nourished, and in no acute distress.  Neuro:  Extra-ocular muscles intact, able to move all 4 extremities, sensation grossly intact.  Deep tendon reflexes tested were normal. Psych: Alert and oriented, mood congruent with affect. ENT:  Ears and nose appear unremarkable.  Hearing grossly normal. Neck: Unremarkable overall appearance, trachea midline.  No visible thyroid enlargement. Eyes: Conjunctivae and lids appear unremarkable.  Pupils equal and round.  Skin: Warm and dry, no rashes noted.  Cardiovascular: Pulses palpable, no extremity edema.  Impression and Recommendations:   This case required medical decision making of moderate complexity.  Primary osteoarthritis of both hips These are not the pain generators.  Failed back surgical syndrome Multiple  fusions with multiple revisions due to loose screws. Currently with [facility] pain management. I did explain to her that we would be unlikely to get her pain completely gone, but that we would continue to try different modalities to help. We set expectations for improvement in pain but not complete relief. She has had months and months of physical therapy, multiple surgeries, epidurals and facet joint injections that have really not been effective. She will continue her narcotic pain management with [facility]. Has not had good responses to gabapentin, holding off on Lyrica for now. Unable to use NSAIDs due to chronic kidney disease. Starting Cymbalta 30 mg. I'd also like her to consider acupuncture, chiropractic manipulation. Return in 1 month, I did ask her to also bring her imaging tests on disc for me to review.  ___________________________________________ Gwen Her. Dianah Field, M.D., ABFM., CAQSM. Primary Care and [place] Instructor of [place] [facility]

## 2017-05-15 ENCOUNTER — Other Ambulatory Visit: Payer: Self-pay | Admitting: *Deleted

## 2017-05-15 MED ORDER — ALBIGLUTIDE 30 MG ~~LOC~~ PEN: 30.0000 mg | PEN_INJECTOR | SUBCUTANEOUS | 3 refills | Status: DC

## 2017-05-15 MED ORDER — LEVOTHYROXINE SODIUM 50 MCG PO TABS: 50.0000 ug | ORAL_TABLET | ORAL | 1 refills | Status: DC

## 2017-05-21 ENCOUNTER — Telehealth: Payer: Self-pay | Admitting: Family Medicine

## 2017-05-21 MED ORDER — DULAGLUTIDE 0.75 MG/0.5ML ~~LOC~~ SOAJ: 0.7500 mg | SUBCUTANEOUS | 1 refills | Status: DC

## 2017-05-21 NOTE — Telephone Encounter (Signed)
Pt advised of Rx change. She wants [place] to call to see how expensive it will be.

## 2017-05-21 NOTE — Telephone Encounter (Signed)
Called [facility], the OOP is $20. They will ship it out to the Pt. Pt advised.

## 2017-05-21 NOTE — Telephone Encounter (Signed)
Please cal pt: the tanzuem she uses is no longer being manufactured. I sent a new script for Trulicity which is once a week. It comes in 2 strengths, so will start with the lower first. If she does well then we may try to increase the dose.

## 2017-06-03 ENCOUNTER — Ambulatory Visit: Payer: Medicare Other | Admitting: Sports Medicine

## 2017-06-25 ENCOUNTER — Other Ambulatory Visit: Payer: Self-pay | Admitting: Family Medicine

## 2017-06-25 ENCOUNTER — Ambulatory Visit (INDEPENDENT_AMBULATORY_CARE_PROVIDER_SITE_OTHER): Payer: Medicare Other

## 2017-06-25 ENCOUNTER — Encounter: Payer: Self-pay | Admitting: Family Medicine

## 2017-06-25 ENCOUNTER — Ambulatory Visit (INDEPENDENT_AMBULATORY_CARE_PROVIDER_SITE_OTHER): Payer: Medicare Other | Admitting: Family Medicine

## 2017-06-25 VITALS — BP 133/73 | HR 85 | Ht 65.0 in | Wt 160.0 lb

## 2017-06-25 DIAGNOSIS — M79671 Pain in right foot: Secondary | ICD-10-CM

## 2017-06-25 DIAGNOSIS — M25552 Pain in left hip: Principal | ICD-10-CM

## 2017-06-25 DIAGNOSIS — M25551 Pain in right hip: Secondary | ICD-10-CM

## 2017-06-25 DIAGNOSIS — M25561 Pain in right knee: Secondary | ICD-10-CM | POA: Diagnosis not present

## 2017-06-25 DIAGNOSIS — M7918 Myalgia, other site: Secondary | ICD-10-CM

## 2017-06-25 NOTE — Progress Notes (Signed)
Subjective:    Patient ID: Barbara Yu, female    DOB: 29-Oct-1945, 72 y.o.   MRN: [id number]  HPI 72 year old female comes in today after she accidentally slipped and fell in the bathtub about a week ago.  She thinks it was last Tuesday.  She does not remember feeling lightheaded or dizzy or weak.  She said she was just washing and then reached out to grab the grab bar in the shower and fell sideways.  She honestly cannot remember if she fell on her right side or her left side but ever since then her right hip in particular down into her right knee and even her right foot have been painful and bothering her.  She is on chronic pain medicine for her back.  She has not seen any bruising or significant swelling but did want me to look at the area on her buttock where it hurts.  She is having pain over both buttock cheek areas below the SI joints bilaterally.  She did take Aleve a couple of times and says it really did help her pain she is just not supposed to use NSAIDs frequently.  Her daughter to pick up some Tylenol arthritis for her to take.  She is coming in today because last night her pain was just as bad as it was the night she actually fell and that really concerned her.  She is been using a menthol type rub and tried getting up and moving around.  Heating pad does provide some mild relief.  Last night, she says even the oxycodone did not help last night.   In regards to her right foot pain.  She has not noticed any bruising or swelling and she is been able to walk on it without difficulty.  It bothered her more last night while her leg was actually hurting.  Review of Systems      BP 133/73   Pulse 85   Ht 5\' 5"  (1.651 m)   Wt 160 lb (72.6 kg)   SpO2 100%   BMI 26.63 kg/m     Allergies  Allergen Reactions  . Amoxicillin Diarrhea and Itching    Other reaction(s): Confusion (intolerance)  . Atorvastatin Other (See Comments)    Burning skin sensation  . Hydrochlorothiazide     Other reaction(s): Other Chronic renal failure  . Nsaids Other (See Comments)    Peptic ulcer disease  . Tramadol Nausea And Vomiting    Other reaction(s): GI Upset (intolerance)  . Morphine Other (See Comments)    Hallucinations  . Ciprofloxacin     Other reaction(s): GI Upset (intolerance) Other reaction(s): Abdominal Pain Other reaction(s): Abdominal Pain  . Gabapentin Nausea Only    Feels "stupid" and wt gain   . Pantoprazole Diarrhea    Past Medical History:  Diagnosis Date  . Normal cardiac stress test 06/09/2012   at [facility] normal cardiac stress test (surgery preop); Dr Norman Clay    No past surgical history on file.  Social History   Socioeconomic History  . Marital status: Widowed    Spouse name: Not on file  . Number of children: Not on file  . Years of education: Not on file  . Highest education level: Not on file  Social Needs  . Financial resource strain: Not on file  . Food insecurity - worry: Not on file  . Food insecurity - inability: Not on file  . Transportation needs - medical: Not on file  . Transportation needs -  non-medical: Not on file  Occupational History  . Occupation: retired  Tobacco Use  . Smoking status: Former Research scientist (life sciences)  . Smokeless tobacco: Never Used  Substance and Sexual Activity  . Alcohol use: No  . Drug use: No  . Sexual activity: No  Other Topics Concern  . Not on file  Social History Narrative  . Not on file    No family history on file.  Outpatient Encounter Medications as of 06/25/2017  Medication Sig  . acetaminophen (TYLENOL) 325 MG tablet Take 650 mg by mouth as needed.  . Albiglutide 30 MG PEN Inject 30 mg into the skin once a week.  Marland Kitchen albuterol (PROVENTIL HFA;VENTOLIN HFA) 108 (90 Base) MCG/ACT inhaler Inhale 2 puffs into the lungs every 6 (six) hours as needed.  Marland Kitchen amLODipine (NORVASC) 5 MG tablet Take 1 tablet (5 mg total) by mouth daily.  Marland Kitchen ammonium lactate (LAC-HYDRIN) 12 % lotion Apply to affected area daily  .  colchicine 0.6 MG tablet Take 1 tablet (0.6 mg total) by mouth 2 (two) times daily.  . diazepam (VALIUM) 5 MG tablet Take 5 mg by mouth as needed.  . DULoxetine (CYMBALTA) 30 MG capsule Take 1 capsule (30 mg total) by mouth daily.  Marland Kitchen estrogen, conjugated,-medroxyprogesterone (PREMPRO) 0.625-5 MG per tablet Take 1 tablet by mouth daily.    Marland Kitchen glimepiride (AMARYL) 4 MG tablet Take 1 tablet (4 mg total) 2 (two) times daily by mouth.  . Glucose Blood (BLOOD GLUCOSE TEST STRIPS) STRP Use to check blood sugars three (3) times daily  DX E11.9 and Z79.4  . Insulin Isophane & Regular Human (HUMULIN 70/30 KWIKPEN) (70-30) 100 UNIT/ML PEN Inject 34 units qam and 24 units qpm  DX E11.9 and Z79.4  . Insulin Pen Needle (PEN NEEDLES 31GX5/16") 31G X 8 MM MISC Use with insulin three (3) times daily  DX E11.9 and Z79.4  . levothyroxine (SYNTHROID, LEVOTHROID) 50 MCG tablet Take 1 tablet (50 mcg total) by mouth 2 (two) times a week. On Sunday and Wednesday. Alternating with 75 mcg the other 5 days  . levothyroxine (SYNTHROID, LEVOTHROID) 75 MCG tablet Take 1 tablet (75 mcg total) by mouth daily.  Marland Kitchen lisinopril (PRINIVIL,ZESTRIL) 40 MG tablet Take 1 tablet (40 mg total) by mouth daily.  . montelukast (SINGULAIR) 10 MG tablet Take 1 tablet (10 mg total) by mouth at bedtime.  Marland Kitchen omeprazole (PRILOSEC) 20 MG capsule Take 1 capsule (20 mg total) by mouth daily.  . Oxycodone HCl 10 MG TABS   . OXYCONTIN 20 MG 12 hr tablet   . polyethylene glycol (MIRALAX / GLYCOLAX) packet Take 17 g by mouth as needed.  . sitaGLIPtin (JANUVIA) 25 MG tablet Take 1 tablet (25 mg total) by mouth daily.  Marland Kitchen tiZANidine (ZANAFLEX) 4 MG capsule Take 4 mg by mouth.  . Turmeric Curcumin 500 MG CAPS Take 1 capsule by mouth daily.  . Vitamin D, Ergocalciferol, (DRISDOL) 50000 units CAPS capsule   . Dulaglutide (TRULICITY) 8.14 GY/1.8HU SOPN Inject 0.75 mg into the skin once a week. (Patient not taking: Reported on 06/25/2017)   No facility-administered  encounter medications on file as of 06/25/2017.       Objective:   Physical Exam  Constitutional: She is oriented to person, place, and time. She appears well-developed and well-nourished.  HENT:  Head: Normocephalic and atraumatic.  Eyes: Conjunctivae and EOM are normal.  Cardiovascular: Normal rate.  Pulmonary/Chest: Effort normal.  Musculoskeletal:  She was able to get up on the examining  table without assistance.  Nontender over the lumbar spine or over her old surgical incision over the lumbar spine.  She does have hardware in place.  Nontender over the SI joints.  But she is tender just over the mid buttock area more so on the right compared to the left.  She is also tender over the right greater trochanter.  She has some weakness with hip flexion compared to her left side.  She also has some stiffness of the right hip with passive extension.  It caused pain over the right lateral hip with extension.  Some discomfort with internal rotation of the hip but mostly over the lateral hip not over the groin crease.  Symmetric range of motion with internal and external rotation of both hips.  Knee and ankle strength is 5 out of 5 bilaterally.  She was tender over the mid proximal second and third metatarsals.  But no swelling or bruising.  Neurological: She is alert and oriented to person, place, and time.  Skin: Skin is dry. No pallor.  Psychiatric: She has a normal mood and affect. Her behavior is normal.  Vitals reviewed.         Assessment & Plan:  Bilateral low back pain over the buttock cheek as well as right hip pain-did do an x-ray today since there was a fall injury just to make sure there was no sign of fracture.  I did not see anything on the imaging.  Will await radiology results for definitive read on the imaging studies.  I would like to give her some exercises to really help the IT band in the hip flexors and have her work on those over the next few days.  She is very stiff with  extension of that right hip.  She does have a muscle relaxer at home but did not really like the way it made her feel.  Okay to use Aleve at bedtime for the next 3 nights only.  I do not increased risk for peptic ulcer disease or kidney problems.  I want her to use her Tylenol arthritis 3 times a day and continue to use her muscle rub and heating pad since it does provide some relief.  If she is not improving over the next couple weeks please let me know.  Recent fall-still unclear etiology.  She may have just slipped in the shower but she did not expense any lightheadedness or dizziness or other systemic symptoms that are concerning for syncope or near syncope.

## 2017-06-25 NOTE — Patient Instructions (Signed)
Ok to take 1 Aleve at bedtime for the next 3 night.  Use your Tylenol Arthritis every 8 hours.   Work on stretches.  See Hand out.   If not better in about 2 weeks then please let me know.

## 2017-06-25 NOTE — Progress Notes (Signed)
Order bilat hip film

## 2017-07-29 ENCOUNTER — Ambulatory Visit: Payer: Medicare Other | Admitting: Family Medicine

## 2017-08-19 ENCOUNTER — Ambulatory Visit (INDEPENDENT_AMBULATORY_CARE_PROVIDER_SITE_OTHER): Payer: Medicare Other | Admitting: Family Medicine

## 2017-08-19 ENCOUNTER — Encounter: Payer: Self-pay | Admitting: Family Medicine

## 2017-08-19 VITALS — BP 134/58 | HR 83 | Ht 65.0 in | Wt 159.0 lb

## 2017-08-19 DIAGNOSIS — R197 Diarrhea, unspecified: Secondary | ICD-10-CM | POA: Diagnosis not present

## 2017-08-19 DIAGNOSIS — Z794 Long term (current) use of insulin: Secondary | ICD-10-CM | POA: Diagnosis not present

## 2017-08-19 DIAGNOSIS — I10 Essential (primary) hypertension: Secondary | ICD-10-CM

## 2017-08-19 DIAGNOSIS — E119 Type 2 diabetes mellitus without complications: Secondary | ICD-10-CM | POA: Diagnosis not present

## 2017-08-19 LAB — CBC WITH DIFFERENTIAL/PLATELET
BASOS PCT: 0.4 %
Basophils Absolute: 32 cells/uL (ref 0–200)
EOS ABS: 864 {cells}/uL — AB (ref 15–500)
Eosinophils Relative: 10.8 %
HCT: 33.9 % — ABNORMAL LOW (ref 35.0–45.0)
HEMOGLOBIN: 10.5 g/dL — AB (ref 11.7–15.5)
Lymphs Abs: 3040 cells/uL (ref 850–3900)
MCH: 23 pg — ABNORMAL LOW (ref 27.0–33.0)
MCHC: 31 g/dL — ABNORMAL LOW (ref 32.0–36.0)
MCV: 74.2 fL — AB (ref 80.0–100.0)
Monocytes Relative: 8.8 %
NEUTROS ABS: 3360 {cells}/uL (ref 1500–7800)
Neutrophils Relative %: 42 %
Platelets: 137 10*3/uL — ABNORMAL LOW (ref 140–400)
RBC: 4.57 10*6/uL (ref 3.80–5.10)
RDW: 15.5 % — ABNORMAL HIGH (ref 11.0–15.0)
Total Lymphocyte: 38 %
WBC: 8 10*3/uL (ref 3.8–10.8)
WBCMIX: 704 {cells}/uL (ref 200–950)

## 2017-08-19 LAB — POCT GLYCOSYLATED HEMOGLOBIN (HGB A1C): Hemoglobin A1C: 6.7

## 2017-08-19 MED ORDER — DULAGLUTIDE 0.75 MG/0.5ML ~~LOC~~ SOAJ: 0.7500 mg | SUBCUTANEOUS | 1 refills | Status: DC

## 2017-08-19 MED ORDER — RANITIDINE HCL 300 MG PO TABS: 300.0000 mg | ORAL_TABLET | Freq: Two times a day (BID) | ORAL | 1 refills | Status: DC

## 2017-08-19 NOTE — Progress Notes (Signed)
Subjective:    CC: DM  HPI:  Diabetes - no hypoglycemic events. No wounds or sores that are not healing well. No increased thirst or urination. Checking glucose at home. Taking medications as prescribed without any side effects.  He has not been eating much overall but when she does eat she does not always make the best choices.  Hypertension- Pt denies chest pain, SOB, dizziness, or heart palpitations.  Taking meds as directed w/o problems.  Denies medication side effects.    Is also concerned because she is been having a change in stools over the last week.  Says initially her stools were very dark and then turned more brown and then yellow and became more snake like.  And then on Saturday started having diarrhea.  She had diarrhea on Saturday, Sunday and then today.  She is taking up to 6 tabs of Imodium and it has not really seem to help.  She has not noticed any blood in the stools or fevers or chills.  She did get a lot of stomach right stomach cramping initially but not so much today.  She is also been having very foul-smelling burps.  She is actually been off of her omeprazole for about 2 weeks.  Because it too was causing some loose stools.  She denies any nausea or vomiting but has had a decreased appetite.  She is been trying to drink an Ensure every day.  Past medical history, Surgical history, Family history not pertinant except as noted below, Social history, Allergies, and medications have been entered into the medical record, reviewed, and corrections made.   Review of Systems: No fevers, chills, night sweats, weight loss, chest pain, or shortness of breath.   Objective:    General: Well Developed, well nourished, and in no acute distress.  Neuro: Alert and oriented x3, extra-ocular muscles intact, sensation grossly intact.  HEENT: Normocephalic, atraumatic  Skin: Warm and dry, no rashes. Cardiac: Regular rate and rhythm, no murmurs rubs or gallops, no lower extremity edema.   Respiratory: Clear to auscultation bilaterally. Not using accessory muscles, speaking in full sentences. Abd: Soft, decreased bowel sounds.  Tender in the left lower quadrant and in the right upper quadrant.  No significant bloating.   Impression and Recommendations:    DM -much improved.  Hemoglobin A1c down to 6.7 which is great.  We will continue with current regimen for now.  Follow-up in 3 months.  HTN  - Well controlled. Continue current regimen. Follow up in  3 months.    Diarrhea-unclear etiology.  May be viral versus bacterial.  For the short-term we will put her on ranitidine as well as a probiotic.  If she is not improving by the end of the week we will get stool cultures as well as check for C. difficile.  If she develops a fever at any point and please let us know immediately.   GERD-we will also try an H2 blocker to control her reflux since she has been able to take to PPIs without causing increase in loose stools.

## 2017-08-19 NOTE — Patient Instructions (Addendum)
Start an over-the-counter probiotic.  You can talk to the pharmacist and see if they have a recommendation.  Couple of good ones include align, or Culturelle.  If you are not feeling some better by the end of the week then please call back and we will do a stool culture and check for C. difficile.

## 2017-08-20 LAB — BASIC METABOLIC PANEL WITH GFR
BUN/Creatinine Ratio: 17 (calc) (ref 6–22)
BUN: 34 mg/dL — AB (ref 7–25)
CALCIUM: 10 mg/dL (ref 8.6–10.4)
CHLORIDE: 111 mmol/L — AB (ref 98–110)
CO2: 20 mmol/L (ref 20–32)
Creat: 2.02 mg/dL — ABNORMAL HIGH (ref 0.60–0.93)
GFR, EST AFRICAN AMERICAN: 28 mL/min/{1.73_m2} — AB (ref >=60)
GFR, Est Non African American: 24 mL/min/{1.73_m2} — ABNORMAL LOW (ref >=60)
Glucose, Bld: 126 mg/dL — ABNORMAL HIGH (ref 65–99)
POTASSIUM: 5.1 mmol/L (ref 3.5–5.3)
Sodium: 139 mmol/L (ref 135–146)

## 2017-08-20 LAB — FERRITIN: Ferritin: 60 ng/mL (ref 20–288)

## 2017-08-21 ENCOUNTER — Other Ambulatory Visit: Payer: Self-pay | Admitting: *Deleted

## 2017-08-21 DIAGNOSIS — D649 Anemia, unspecified: Secondary | ICD-10-CM

## 2017-08-21 DIAGNOSIS — D500 Iron deficiency anemia secondary to blood loss (chronic): Secondary | ICD-10-CM

## 2017-09-23 ENCOUNTER — Telehealth: Payer: Self-pay | Admitting: *Deleted

## 2017-09-23 MED ORDER — LIRAGLUTIDE 18 MG/3ML ~~LOC~~ SOPN: 0.6000 mg | PEN_INJECTOR | Freq: Every day | SUBCUTANEOUS | 1 refills | Status: DC

## 2017-09-23 NOTE — Telephone Encounter (Signed)
Pt wanted to know if Dr. Ronney Asters is going to restart her on another once weekly medication like the Trulicity? She was taking 0.75 mg weekly.  Since she was unable to tolerate this medication and stopped this since her last OV back at the end of April.  Will fwd to pcp for advice.Barbara Yu, [place]

## 2017-09-23 NOTE — Telephone Encounter (Signed)
Will try Victoza which is daily but has a smaller needle.  Sent to mail order.

## 2017-09-25 ENCOUNTER — Encounter: Payer: Self-pay | Admitting: *Deleted

## 2017-09-25 ENCOUNTER — Other Ambulatory Visit: Payer: Self-pay | Admitting: *Deleted

## 2017-09-25 DIAGNOSIS — E119 Type 2 diabetes mellitus without complications: Secondary | ICD-10-CM

## 2017-09-25 DIAGNOSIS — M1A0790 Idiopathic chronic gout, unspecified ankle and foot, without tophus (tophi): Secondary | ICD-10-CM

## 2017-09-25 DIAGNOSIS — E559 Vitamin D deficiency, unspecified: Secondary | ICD-10-CM

## 2017-09-25 DIAGNOSIS — Z794 Long term (current) use of insulin: Principal | ICD-10-CM

## 2017-09-25 DIAGNOSIS — J209 Acute bronchitis, unspecified: Secondary | ICD-10-CM

## 2017-09-25 MED ORDER — GLIMEPIRIDE 4 MG PO TABS: 4.0000 mg | ORAL_TABLET | Freq: Two times a day (BID) | ORAL | 3 refills | Status: DC

## 2017-09-25 MED ORDER — COLCHICINE 0.6 MG PO TABS: 0.6000 mg | ORAL_TABLET | Freq: Two times a day (BID) | ORAL | 3 refills | Status: DC

## 2017-09-25 MED ORDER — AMMONIUM LACTATE 12 % EX LOTN: TOPICAL_LOTION | CUTANEOUS | 3 refills | Status: DC

## 2017-09-25 MED ORDER — VITAMIN D (ERGOCALCIFEROL) 1.25 MG (50000 UNIT) PO CAPS: 50000.0000 [IU] | ORAL_CAPSULE | ORAL | 1 refills | Status: DC

## 2017-09-25 MED ORDER — ALBUTEROL SULFATE HFA 108 (90 BASE) MCG/ACT IN AERS: 2.0000 | INHALATION_SPRAY | Freq: Four times a day (QID) | RESPIRATORY_TRACT | 4 refills | Status: DC | PRN

## 2017-10-08 ENCOUNTER — Other Ambulatory Visit: Payer: Self-pay | Admitting: *Deleted

## 2017-10-08 MED ORDER — RANITIDINE HCL 300 MG PO TABS: 300.0000 mg | ORAL_TABLET | Freq: Two times a day (BID) | ORAL | 3 refills | Status: DC

## 2017-10-28 ENCOUNTER — Other Ambulatory Visit: Payer: Self-pay

## 2017-10-28 ENCOUNTER — Emergency Department (INDEPENDENT_AMBULATORY_CARE_PROVIDER_SITE_OTHER): Payer: Medicare Other

## 2017-10-28 ENCOUNTER — Emergency Department (INDEPENDENT_AMBULATORY_CARE_PROVIDER_SITE_OTHER)
Admission: EM | Admit: 2017-10-28 | Discharge: 2017-10-28 | Disposition: A | Discharge status: Home / Self Care | Payer: Medicare Other | Source: Home / Self Care | Attending: Family Medicine | Admitting: Family Medicine

## 2017-10-28 DIAGNOSIS — R05 Cough: Secondary | ICD-10-CM

## 2017-10-28 DIAGNOSIS — I700 Atherosclerosis of aorta: Secondary | ICD-10-CM | POA: Diagnosis not present

## 2017-10-28 DIAGNOSIS — R918 Other nonspecific abnormal finding of lung field: Secondary | ICD-10-CM

## 2017-10-28 DIAGNOSIS — J209 Acute bronchitis, unspecified: Secondary | ICD-10-CM | POA: Diagnosis not present

## 2017-10-28 HISTORY — DX: Type 2 diabetes mellitus without complications: E11.9

## 2017-10-28 HISTORY — DX: Anemia, unspecified: D64.9

## 2017-10-28 HISTORY — DX: Essential (primary) hypertension: I10

## 2017-10-28 LAB — POCT CBC W AUTO DIFF (K'VILLE URGENT CARE)

## 2017-10-28 MED ORDER — FLUCONAZOLE 200 MG PO TABS: 200.0000 mg | ORAL_TABLET | Freq: Once | ORAL | 1 refills | Status: AC

## 2017-10-28 MED ORDER — AZITHROMYCIN 250 MG PO TABS: 250.0000 mg | ORAL_TABLET | Freq: Every day | ORAL | 0 refills | Status: DC

## 2017-10-28 MED ORDER — PREDNISONE 20 MG PO TABS: ORAL_TABLET | ORAL | 0 refills | Status: DC

## 2017-10-28 NOTE — Discharge Instructions (Signed)
°  Your chest x-ray did not show any pneumonia but your lung exam did reveal some wheezing.  You may take the prednisone and continue your breathing treatments to help with cough and wheeze.  If symptoms worsening- increased shortness of breath, chest tightness, fever >100.4*F, vomiting or other new symptoms develop, please start taking the antibiotic- azithromycin.  Please follow up with Dr. Madilyn Fireman next week if not improving.

## 2017-10-28 NOTE — ED Provider Notes (Signed)
[facility]    CSN: [id number] Arrival date & time: 10/28/17  7782     History   Chief Complaint Chief Complaint  Patient presents with  . Cough  . Fever    HPI Barbara Yu is a 72 y.o. female.   HPI Barbara Yu is a 72 y.o. female presenting to UC with c/o feeling back for 8 days with associated productive cough that has progressively worsened with yellowish green sputum.  Associated chest pain with cough.  Mild wheeze. She has used her home inhaler and duoneb but no relief.  Subjective fever. Hx of bronchitis earlier this year but no pneumonia. Denies n/v/d.   Past Medical History:  Diagnosis Date  . Anemia   . Diabetes mellitus without complication ([place])   . Hypertension   . Normal cardiac stress test 06/09/2012   at [facility] normal cardiac stress test (surgery preop); Dr Norman Clay  . Vitamin D deficiency     Patient Active Problem List   Diagnosis Date Noted  . CKD (chronic kidney disease) stage 3, GFR 30-59 ml/min (HCC) 10/05/2016  . Gout 10/03/2016  . Osteoarthritis 10/02/2016  . Peripheral neuropathy 10/02/2016  . Iron deficiency anemia 10/02/2016  . AR (allergic rhinitis) 10/02/2016  . Bronchitis, acute 05/21/2016  . Vitamin D deficiency 06/24/2014  . Gastro-esophageal reflux disease without esophagitis 06/03/2012  . HTN (hypertension) 06/03/2012  . Hypothyroidism 06/03/2012  . Type 2 diabetes mellitus without complications ([place]) 42/35/3614  . 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 Primary osteoarthritis of both hips 01/03/2012    Past Surgical History:  Procedure Laterality Date  . BACK SURGERY      OB History   None      Home Medications    Prior to Admission medications   Medication Sig Start Date End Date Taking? Authorizing Provider  acetaminophen (TYLENOL) 325 MG tablet Take 650 mg by mouth as needed. 08/02/16   [provider]  albuterol (PROVENTIL HFA;VENTOLIN HFA) 108 (90 Base) MCG/ACT inhaler Inhale 2  puffs into the lungs every 6 (six) hours as needed. 09/25/17   [name], MD  amLODipine (NORVASC) 5 MG tablet Take 1 tablet (5 mg total) by mouth daily. 12/12/16   [name], MD  ammonium lactate (LAC-HYDRIN) 12 % lotion Apply to affected area daily 09/25/17   [name], MD  azithromycin (ZITHROMAX) 250 MG tablet Take 1 tablet (250 mg total) by mouth daily. Take first 2 tablets together, then 1 every day until finished. 10/28/17   Noe Gens, PA-C  colchicine 0.6 MG tablet Take 1 tablet (0.6 mg total) by mouth 2 (two) times daily. 09/25/17   [name], MD  diazepam (VALIUM) 5 MG tablet Take 5 mg by mouth as needed. 07/02/14   [provider]  estrogen, conjugated,-medroxyprogesterone (PREMPRO) 0.625-5 MG per tablet Take 1 tablet by mouth daily.      [provider]  fluconazole (DIFLUCAN) 200 MG tablet Take 1 tablet (200 mg total) by mouth once for 1 dose. 10/28/17 10/28/17  Noe Gens, PA-C  glimepiride (AMARYL) 4 MG tablet Take 1 tablet (4 mg total) by mouth 2 (two) times daily. 09/25/17   [name], MD  Glucose Blood (BLOOD GLUCOSE TEST STRIPS) STRP Use to check blood sugars three (3) times daily  DX E11.9 and Z79.4 04/12/17   [name], MD  Insulin Isophane & Regular Human (HUMULIN 70/30 KWIKPEN) (70-30) 100 UNIT/ML PEN Inject 34 units qam and 24  units qpm  DX E11.9 and Z79.4 12/14/16   [name], MD  Insulin Pen Needle (PEN NEEDLES 31GX5/16") 31G X 8 MM MISC Use with insulin three (3) times daily  DX E11.9 and Z79.4 02/21/17   [name], MD  levothyroxine (SYNTHROID, LEVOTHROID) 50 MCG tablet Take 1 tablet (50 mcg total) by mouth 2 (two) times a week. On Sunday and Wednesday. Alternating with 75 mcg the other 5 days 05/16/17   [name], MD  levothyroxine (SYNTHROID, LEVOTHROID) 75 MCG tablet Take 1 tablet (75 mcg total) by mouth daily. 11/15/16   [name], MD    liraglutide (VICTOZA) 18 MG/3ML SOPN Inject 0.1 mLs (0.6 mg total) into the skin daily. 09/23/17   [name], MD  lisinopril (PRINIVIL,ZESTRIL) 40 MG tablet Take 1 tablet (40 mg total) by mouth daily. 11/15/16   [name], MD  montelukast (SINGULAIR) 10 MG tablet Take 1 tablet (10 mg total) by mouth at bedtime. 11/15/16 11/15/17  [name], MD  Oxycodone HCl 10 MG TABS  04/04/17   [provider]  OXYCONTIN 20 MG 12 hr tablet  04/04/17   [provider]  polyethylene glycol (MIRALAX / GLYCOLAX) packet Take 17 g by mouth as needed. 06/17/12   [provider]  predniSONE (DELTASONE) 20 MG tablet 3 tabs po day one, then 2 po daily x 4 days 10/28/17   Noe Gens, PA-C  ranitidine (ZANTAC) 300 MG tablet Take 1 tablet (300 mg total) by mouth 2 (two) times daily. 10/08/17   [name], MD  sitaGLIPtin (JANUVIA) 25 MG tablet Take 1 tablet (25 mg total) by mouth daily. 11/15/16   [name], MD  tiZANidine (ZANAFLEX) 4 MG capsule Take 4 mg by mouth. 06/04/17   [provider]  Turmeric Curcumin 500 MG CAPS Take 1 capsule by mouth daily.    [provider]  Vitamin D, Ergocalciferol, (DRISDOL) 50000 units CAPS capsule Take 1 capsule (50,000 Units total) by mouth every 7 (seven) days. 09/25/17   [name], MD    Family History History reviewed. No pertinent family history.  Social History Social History   Tobacco Use  . Smoking status: Former Research scientist (life sciences)  . Smokeless tobacco: Never Used  Substance Use Topics  . Alcohol use: No  . Drug use: No     Allergies   Amoxicillin; Atorvastatin; Hydrochlorothiazide; Nsaids; Tramadol; Morphine; Omeprazole; Ciprofloxacin; Gabapentin; and Pantoprazole   Review of Systems Review of Systems  Constitutional: Positive for fever. Negative for chills.  HENT: Positive for congestion. Negative for ear pain, sore throat, trouble swallowing and voice change.    Respiratory: Positive for cough and wheezing. Negative for shortness of breath.   Cardiovascular: Negative for chest pain and palpitations.  Gastrointestinal: Negative for abdominal pain, diarrhea, nausea and vomiting.  Musculoskeletal: Negative for arthralgias, back pain and myalgias.  Skin: Negative for rash.     Physical Exam Triage Vital Signs ED Triage Vitals [10/28/17 0959]  Enc Vitals Group     BP (!) 147/78     Pulse Rate 87     Resp 18     Temp 98.7 F (37.1 C)     Temp Source Oral     SpO2 98 %     Weight 153 lb (69.4 kg)     Height 5\' 4"  (1.626 m)     Head Circumference      Peak Flow      Pain  Score 0     Pain Loc      Pain Edu?      Excl. in [place]?    No data found.  Updated Vital Signs BP (!) 147/78 (BP Location: Right Arm)   Pulse 87   Temp 98.7 F (37.1 C) (Oral)   Resp 18   Ht 5\' 4"  (1.626 m)   Wt 153 lb (69.4 kg)   SpO2 98%   BMI 26.26 kg/m   Visual Acuity Right Eye Distance:   Left Eye Distance:   Bilateral Distance:    Right Eye Near:   Left Eye Near:    Bilateral Near:     Physical Exam  Constitutional: She is oriented to person, place, and time. She appears well-developed and well-nourished. No distress.  HENT:  Head: Normocephalic and atraumatic.  Right Ear: Tympanic membrane normal.  Left Ear: Tympanic membrane normal.  Nose: Nose normal.  Mouth/Throat: Uvula is midline, oropharynx is clear and moist and mucous membranes are normal.  Eyes: EOM are normal.  Neck: Normal range of motion. Neck supple.  Cardiovascular: Normal rate and regular rhythm.  Pulmonary/Chest: Effort normal. No stridor. No respiratory distress. She has wheezes ( diffuse, faint). She has no rales.  Musculoskeletal: Normal range of motion.  Neurological: She is alert and oriented to person, place, and time.  Skin: Skin is warm and dry. She is not diaphoretic.  Psychiatric: She has a normal mood and affect. Her behavior is normal.  Nursing note and vitals  reviewed.    UC Treatments / Results  Labs (all labs ordered are listed, but only abnormal results are displayed) Labs Reviewed  POCT CBC W AUTO DIFF ([place])    EKG None  Radiology Dg Chest 2 View  Result Date: 10/28/2017 CLINICAL DATA:  Cough, chest congestion, and wheezing. Subjective fever. EXAM: CHEST - 2 VIEW COMPARISON:  None. FINDINGS: There is a 15 mm density overlying the posterior inferior aspect of the T4 vertebral body. This is only seen on the lateral view. Heart size and vascularity are normal. No infiltrates or effusions. No acute bone abnormality. Lumbar fusion. Previous left rotator cuff repair. IMPRESSION: Indeterminate 15 mm density overlying the T4 vertebra on the lateral view. No prior studies available for comparison at this time. This could represent a sclerotic bone lesion or less likely a pulmonary nodule or confluence of normal structures. Comparison with outside prior chest x-rays may be helpful. If none are available, CT scan of the chest without contrast could further evaluate this area. Otherwise, essentially normal chest. Electronically Signed   By: Lorriane Shire M.D.   On: 10/28/2017 10:25   Ct Chest Wo Contrast  Result Date: 10/28/2017 CLINICAL DATA:  Abnormal chest radiograph.  Persistent cough. EXAM: CT CHEST WITHOUT CONTRAST TECHNIQUE: Multidetector CT imaging of the chest was performed following the standard protocol without IV contrast. COMPARISON:  Chest radiograph 10/28/2017. FINDINGS: Cardiovascular: Atherosclerotic calcification of the arterial vasculature, including coronary arteries. Heart size normal. No pericardial effusion. Mediastinum/Nodes: No pathologically enlarged mediastinal or axillary lymph nodes. Hilar regions are difficult to evaluate without IV contrast. Esophagus is grossly unremarkable. Lungs/Pleura: Scattered mucoid impaction. 4 mm right lower lobe nodule (series 3, image 86). 5 mm subpleural left lower lobe nodule. No  pleural fluid. Airway is unremarkable. Upper Abdomen: Visualized portion of the liver is unremarkable. Common bile duct dilatation is likely related to cholecystectomy. Visualized portions of the adrenal glands are unremarkable. There may be a tiny stone in the right  kidney. Visualized portions of the kidneys, spleen, pancreas, stomach and bowel are otherwise grossly unremarkable. No upper abdominal adenopathy. Musculoskeletal: Degenerative and postoperative changes in the spine. IMPRESSION: 1. No findings to explain the questioned abnormality on today's chest radiograph. 2. Pulmonary nodules measure 5 mm or less in size. No follow-up needed if patient is low-risk (and has no known or suspected primary neoplasm). Non-contrast chest CT can be considered in 12 months if patient is high-risk. This recommendation follows the consensus statement: Guidelines for Management of Incidental Pulmonary Nodules Detected on CT Images: From the Fleischner Society 2017; Radiology 2017; 284:228-243. 3. Aortic atherosclerosis (ICD10-170.0). Coronary artery calcification. 4. Possible right renal stone. Electronically Signed   By: Lorin Picket M.D.   On: 10/28/2017 11:29    Procedures Procedures (including critical care time)  Medications Ordered in UC Medications - No data to display  Initial Impression / Assessment and Plan / UC Course  I have reviewed the triage vital signs and the nursing notes.  Pertinent labs & imaging results that were available during my care of the patient were reviewed by me and considered in my medical decision making (see chart for details).     Hx and exam c/w acute bronchitis. Encouraged symptomatic treatment first. Home care instructions provided below.   Final Clinical Impressions(s) / UC Diagnoses   Final diagnoses:  Acute bronchitis, unspecified organism     Discharge Instructions      Your chest x-ray did not show any pneumonia but your lung exam did reveal some  wheezing.  You may take the prednisone and continue your breathing treatments to help with cough and wheeze.  If symptoms worsening- increased shortness of breath, chest tightness, fever >100.4*F, vomiting or other new symptoms develop, please start taking the antibiotic- azithromycin.  Please follow up with Dr. Madilyn Fireman next week if not improving.     ED Prescriptions    Medication Sig Dispense Auth. Provider   predniSONE (DELTASONE) 20 MG tablet 3 tabs po day one, then 2 po daily x 4 days 11 tablet Nina Mondor O, PA-C   azithromycin (ZITHROMAX) 250 MG tablet Take 1 tablet (250 mg total) by mouth daily. Take first 2 tablets together, then 1 every day until finished. 6 tablet Gerarda Fraction, Goldie [place] O, PA-C   fluconazole (DIFLUCAN) 200 MG tablet Take 1 tablet (200 mg total) by mouth once for 1 dose. 1 tablet Noe Gens, PA-C     Controlled Substance Prescriptions [place] Controlled Substance Registry consulted? Not Applicable   Tyrell Antonio 10/28/17 7121

## 2017-10-28 NOTE — ED Triage Notes (Signed)
Has been feeling bad since last Sunday, cough progressively worse.  Chest pain when coughing.  Coughing up yellowish green phlegm.

## 2017-10-29 ENCOUNTER — Other Ambulatory Visit: Payer: Self-pay | Admitting: *Deleted

## 2017-10-30 ENCOUNTER — Telehealth: Payer: Self-pay | Admitting: Emergency Medicine

## 2017-10-30 NOTE — Progress Notes (Signed)
Spoke with Katrina at [employer] and she stated that 1 pack of pens would be considered a 90 day supply.

## 2017-10-31 ENCOUNTER — Other Ambulatory Visit: Payer: Self-pay | Admitting: Family Medicine

## 2017-11-04 ENCOUNTER — Other Ambulatory Visit: Payer: Self-pay | Admitting: Family Medicine

## 2017-11-04 ENCOUNTER — Telehealth: Payer: Self-pay | Admitting: *Deleted

## 2017-11-04 MED ORDER — FLUCONAZOLE 150 MG PO TABS: 150.0000 mg | ORAL_TABLET | Freq: Once | ORAL | 0 refills | Status: AC

## 2017-11-04 NOTE — Telephone Encounter (Signed)
Closed encounter by accident.Barbara Yu, [place]

## 2017-11-04 NOTE — Telephone Encounter (Signed)
Pt called and stated that she is experiencing external burning and itching. She was given prednisone and an abx for bronchitis when she was seen @ UC last week and was told that they would send over something in case she got a yeast infection but nothing was sent. She would like something for this. She reports using some left over Terconazole cream. Will fwd to pcp for advice.Barbara Yu, [place]

## 2017-11-05 ENCOUNTER — Encounter: Payer: Self-pay | Admitting: Family Medicine

## 2017-11-05 LAB — HM DIABETES EYE EXAM

## 2017-11-25 ENCOUNTER — Encounter: Payer: Self-pay | Admitting: Family Medicine

## 2017-11-25 ENCOUNTER — Ambulatory Visit (INDEPENDENT_AMBULATORY_CARE_PROVIDER_SITE_OTHER): Payer: Medicare Other | Admitting: Family Medicine

## 2017-11-25 VITALS — BP 124/62 | HR 87 | Wt 161.0 lb

## 2017-11-25 DIAGNOSIS — E038 Other specified hypothyroidism: Secondary | ICD-10-CM

## 2017-11-25 DIAGNOSIS — Z794 Long term (current) use of insulin: Secondary | ICD-10-CM

## 2017-11-25 DIAGNOSIS — I10 Essential (primary) hypertension: Secondary | ICD-10-CM | POA: Diagnosis not present

## 2017-11-25 DIAGNOSIS — E119 Type 2 diabetes mellitus without complications: Secondary | ICD-10-CM | POA: Diagnosis not present

## 2017-11-25 LAB — POCT GLYCOSYLATED HEMOGLOBIN (HGB A1C): Hemoglobin A1C: 7.4 % — AB (ref 4.0–5.6)

## 2017-11-25 NOTE — Progress Notes (Signed)
Subjective:    CC:   HPI:  Diabetes - no hypoglycemic events. No wounds or sores that are not healing well. No increased thirst or urination. Checking glucose at home. Taking medications as prescribed without any side effects.Using 36 units BID on the Humulin 70/30.  Been having some hypoglycemic events particularly at night.  She will wake up with abdominal pain and check her glucose and its been as low as in the 30s.  He has been on some prednisone recently. She hasn't been eating regularly.  She has been snacking on pecans.   Hypertension- Pt denies chest pain, SOB, dizziness, or heart palpitations.  Taking meds as directed w/o problems.  Denies medication side effects.    Hypothyroidism - Taking medication regularly in the AM away from food and vitamins, etc. No recent change to skin, hair, or energy levels.  Past medical history, Surgical history, Family history not pertinant except as noted below, Social history, Allergies, and medications have been entered into the medical record, reviewed, and corrections made.   Review of Systems: No fevers, chills, night sweats, weight loss, chest pain, or shortness of breath.   Objective:    General: Well Developed, well nourished, and in no acute distress.  Neuro: Alert and oriented x3, extra-ocular muscles intact, sensation grossly intact.  HEENT: Normocephalic, atraumatic  Skin: Warm and dry, no rashes. Cardiac: Regular rate and rhythm, no murmurs rubs or gallops, no lower extremity edema.  Respiratory: Clear to auscultation bilaterally. Not using accessory muscles, speaking in full sentences.   Impression and Recommendations:    DM - Uncontrolled. A1C up to 7.4. Discussed actually decreasing her insulin due to lows in the middle of the night.  Also needs to eat more regularly with insulin and that will help with consistancey. Move the Victoza shot 0.6mg  to the morning right after your insulin shot.  Give your second insulin shot right  before the last meal of the day (evening meal). Ok to decrease dose to 30 units if not eating much. Try to eat 3 meals a day (ok for them to be small)  HTN - Well controlled. Continue current regimen. Follow up in  3-4 months.    Hypothyroudism - due to recheck levels. She is doing well with medication.

## 2017-11-25 NOTE — Patient Instructions (Addendum)
Move the Victoza shot 0.6mg  to the morning right after your insulin shot.   Give your second insulin shot right before the last meal of the day (evening meal). Ok to decrease dose to 30 units if not eating much.  Try to eat 3 meals a day (ok for them to be small)

## 2017-11-26 LAB — IRON

## 2017-11-26 LAB — FERRITIN: Ferritin: 57 ng/mL (ref 16–288)

## 2017-11-26 LAB — HEMOGLOBIN: Hemoglobin: 10.9 g/dL — ABNORMAL LOW (ref 11.7–15.5)

## 2017-11-27 ENCOUNTER — Encounter: Payer: Self-pay | Admitting: Family Medicine

## 2017-11-27 LAB — FERRITIN

## 2017-11-27 LAB — TSH: TSH: 1.08 mIU/L (ref 0.40–4.50)

## 2017-12-25 ENCOUNTER — Other Ambulatory Visit: Payer: Self-pay | Admitting: Family Medicine

## 2017-12-25 DIAGNOSIS — E559 Vitamin D deficiency, unspecified: Secondary | ICD-10-CM

## 2017-12-25 DIAGNOSIS — E119 Type 2 diabetes mellitus without complications: Secondary | ICD-10-CM

## 2017-12-25 DIAGNOSIS — Z794 Long term (current) use of insulin: Principal | ICD-10-CM

## 2017-12-26 ENCOUNTER — Other Ambulatory Visit: Payer: Self-pay | Admitting: *Deleted

## 2017-12-26 MED ORDER — SITAGLIPTIN PHOSPHATE 25 MG PO TABS: 25.0000 mg | ORAL_TABLET | Freq: Every day | ORAL | 3 refills | Status: DC

## 2017-12-26 MED ORDER — LEVOTHYROXINE SODIUM 75 MCG PO TABS: 75.0000 ug | ORAL_TABLET | Freq: Every day | ORAL | 3 refills | Status: DC

## 2017-12-26 MED ORDER — MONTELUKAST SODIUM 10 MG PO TABS: 10.0000 mg | ORAL_TABLET | Freq: Every day | ORAL | 3 refills | Status: DC

## 2018-02-03 DIAGNOSIS — N184 Chronic kidney disease, stage 4 (severe): Secondary | ICD-10-CM

## 2018-02-03 DIAGNOSIS — D631 Anemia in chronic kidney disease: Secondary | ICD-10-CM

## 2018-02-03 DIAGNOSIS — N183 Chronic kidney disease, stage 3 unspecified: Secondary | ICD-10-CM | POA: Insufficient documentation

## 2018-02-03 HISTORY — DX: Chronic kidney disease, stage 4 (severe): N18.4

## 2018-02-03 HISTORY — DX: Chronic kidney disease, stage 3 unspecified: N18.30

## 2018-02-03 HISTORY — DX: Chronic kidney disease, stage 4 (severe): D63.1

## 2018-02-24 ENCOUNTER — Encounter: Payer: Self-pay | Admitting: Family Medicine

## 2018-02-24 ENCOUNTER — Ambulatory Visit (INDEPENDENT_AMBULATORY_CARE_PROVIDER_SITE_OTHER): Payer: Medicare Other | Admitting: Family Medicine

## 2018-02-24 VITALS — BP 127/68 | HR 85 | Ht 64.0 in | Wt 158.0 lb

## 2018-02-24 DIAGNOSIS — N183 Chronic kidney disease, stage 3 unspecified: Secondary | ICD-10-CM

## 2018-02-24 DIAGNOSIS — E11628 Type 2 diabetes mellitus with other skin complications: Secondary | ICD-10-CM

## 2018-02-24 DIAGNOSIS — I10 Essential (primary) hypertension: Secondary | ICD-10-CM

## 2018-02-24 DIAGNOSIS — E119 Type 2 diabetes mellitus without complications: Secondary | ICD-10-CM

## 2018-02-24 DIAGNOSIS — Z794 Long term (current) use of insulin: Secondary | ICD-10-CM | POA: Diagnosis not present

## 2018-02-24 DIAGNOSIS — L84 Corns and callosities: Secondary | ICD-10-CM

## 2018-02-24 LAB — POCT GLYCOSYLATED HEMOGLOBIN (HGB A1C): Hemoglobin A1C: 7.3 % — AB (ref 4.0–5.6)

## 2018-02-24 MED ORDER — INSULIN ISOPHANE & REGULAR (HUMAN 70-30)100 UNIT/ML KWIKPEN: PEN_INJECTOR | SUBCUTANEOUS | 4 refills | Status: DC

## 2018-02-24 NOTE — Progress Notes (Addendum)
Subjective:    CC: DM  HPI:  Diabetes - no hypoglycemic events. No wounds or sores that are not healing well. No increased thirst or urination. Checking glucose at home. Taking medications as prescribed without any side effects. Taking 34 units of humalin in AM and PM. She has had a couple of lows. Lowest was about 47 about a week ago. Happened first thing AM. Says will happen sometimes if she doesn't eat before she goes to bed.    Hypertension- Pt denies chest pain, SOB, dizziness, or heart palpitations.  Taking meds as directed w/o problems.  Denies medication side effects.    She just saw her nephrologist.  Unfortunately Dr. Duffy Bruce retired and so she is now seeing Dr. Felicity Pellegrini.  Her hemoglobin with him looked better 11.5.  Past medical history, Surgical history, Family history not pertinant except as noted below, Social history, Allergies, and medications have been entered into the medical record, reviewed, and corrections made.   Review of Systems: No fevers, chills, night sweats, weight loss, chest pain, or shortness of breath.   Objective:    General: Well Developed, well nourished, and in no acute distress.  Neuro: Alert and oriented x3, extra-ocular muscles intact, sensation grossly intact.  HEENT: Normocephalic, atraumatic  Skin: Warm and dry, no rashes. Cardiac: Regular rate and rhythm, no murmurs rubs or gallops, no lower extremity edema.  Respiratory: Clear to auscultation bilaterally. Not using accessory muscles, speaking in full sentences.   Impression and Recommendations:    DM - A1C of 7.3 today.  Decrease his evening dose of Humulin to 32 units instead of 34 secondary to low blood sugars.  HTN - Well controlled. Continue current regimen. Follow up in  6 months.   CKD 3 - following with Nephrology yearly.  Up to date.    Pressure callus with type 2 diabetes-see note below.  Under care of podiatry.  Candidate for diabetic foot wear.  Diabetic Foot Exam - Simple   Simple  Foot Form Diabetic Foot exam was performed with the following findings:  Yes 02/24/2018  1:10 PM  Visual Inspection No deformities, no ulcerations, no other skin breakdown bilaterally:  Yes Sensation Testing Intact to touch and monofilament testing bilaterally:  Yes Pulse Check Posterior Tibialis and Dorsalis pulse intact bilaterally:  Yes Comments Pt has bilateral calluses on great toes .Elouise Munroe, [place]

## 2018-02-24 NOTE — Patient Instructions (Signed)
Decrease his evening dose of Humulin to 32 units instead of 34 secondary to low blood sugars.

## 2018-02-25 ENCOUNTER — Encounter: Payer: Self-pay | Admitting: *Deleted

## 2018-02-26 ENCOUNTER — Telehealth: Payer: Self-pay | Admitting: *Deleted

## 2018-02-26 NOTE — Telephone Encounter (Signed)
Form completed for diabetic shoes. Form scanned Fax confirmation received.Barbara Yu, [place]

## 2018-05-01 ENCOUNTER — Ambulatory Visit (INDEPENDENT_AMBULATORY_CARE_PROVIDER_SITE_OTHER): Payer: Medicare Other | Admitting: Family Medicine

## 2018-05-01 ENCOUNTER — Encounter: Payer: Self-pay | Admitting: Family Medicine

## 2018-05-01 VITALS — BP 162/75 | HR 89 | Ht 64.0 in | Wt 160.0 lb

## 2018-05-01 DIAGNOSIS — R350 Frequency of micturition: Secondary | ICD-10-CM

## 2018-05-01 DIAGNOSIS — R3589 Other polyuria: Secondary | ICD-10-CM

## 2018-05-01 DIAGNOSIS — R358 Other polyuria: Secondary | ICD-10-CM

## 2018-05-01 DIAGNOSIS — Z1159 Encounter for screening for other viral diseases: Secondary | ICD-10-CM

## 2018-05-01 LAB — POCT URINALYSIS DIPSTICK
Bilirubin, UA: NEGATIVE
Blood, UA: NEGATIVE
Glucose, UA: NEGATIVE
KETONES UA: NEGATIVE
Leukocytes, UA: NEGATIVE
Nitrite, UA: NEGATIVE
PH UA: 5 (ref 5.0–8.0)
Protein, UA: NEGATIVE
Spec Grav, UA: 1.02 (ref 1.010–1.025)
Urobilinogen, UA: 0.2 E.U./dL

## 2018-05-01 NOTE — Progress Notes (Signed)
Barbara Yu is a 73 y.o. female, with a past medical history of UTI, kidney stones, overactive bladder, diabetes, and CKD, who presents to [place] [place] today for increased urinary frequency. Barbara Yu has been urinating constantly for the past 3-4 weeks. During the day, she is urinating every 1.5-2 hours, and she goes about 5 times during the night. Normally, she only has to urinate about 1-2 times per night. She passes a lot of urine every time she has to go to the bathroom. She also feels a pain/pressure in the suprapubic region that feels like it's pushing on her uterus. She also complains of increased urinary urgency. Denies hematuria and dysuria. She has tried drinking less during the day, which has not relieved her urinary symptoms.   She has a history of UTI and kidney stones, but notes that this feels different than both. She has a history of overactive bladder.  She has taken a medicine for this although she cannot remember exactly the name of it.  She cannot remember why she discontinued it.  She thinks she still has some at home.     ROS as above:  Exam:  BP (!) 162/75   Pulse 89   Ht 5' 4"  (1.626 m)   Wt 160 lb (72.6 kg)   BMI 27.46 kg/m  Wt Readings from Last 5 Encounters:  05/01/18 160 lb (72.6 kg)  02/24/18 158 lb (71.7 kg)  11/25/17 161 lb (73 kg)  10/28/17 153 lb (69.4 kg)  08/19/17 159 lb (72.1 kg)    Gen: Well NAD nontoxic appearing alert and oriented HEENT: EOMI,  MMM Lungs: Normal work of breathing. CTABL Heart: RRR no MRG Abd: NABS, Soft. Nondistended, Mildly tender to palpation over all four quadrants. Increased TTP over suprapubic region.  No rebound or guarding.  Tenderness is quite minimal.  No CVA angle tenderness to percussion Exts: Brisk capillary refill, warm and well perfused.   Lab and Radiology Results Results for orders  placed or performed in visit on 05/01/18 (from the past 72 hour(s))  POCT Urinalysis Dipstick     Status: None   Collection Time: 05/01/18  9:33 AM  Result Value Ref Range   Color, UA yellow    Clarity, UA clear    Glucose, UA Negative Negative   Bilirubin, UA negative    Ketones, UA negative    Spec Grav, UA 1.020 1.010 - 1.025   Blood, UA negative    pH, UA 5.0 5.0 - 8.0   Protein, UA Negative Negative   Urobilinogen, UA 0.2 0.2 or 1.0 E.U./dL   Nitrite, UA negative    Leukocytes, UA Negative Negative   Appearance     Odor     No results found.    Assessment and Plan: 73 y.o. female with  Increased urinary frequency: Barbara Yu has had increased urinary frequency and urgency, as well as suprapubic pressure, for the last month. Concerned for UTI, bladder spasms, worsening kidney function, and diabetes insipidus. UA performed today was normal. No signs of UTI or glucosuria. Will follow-up on labs - CBC w/diff, CMP, urine osmolality, and urine sodium. In the meantime, instructed pt to restart her bladder spasm medication. She will send a message with the specific drug and dose and if it works. Follow-up with pt on lab results.   Health maintenance: Follow-up on Hep C antibody lab.   PDMP not reviewed this encounter. Orders Placed This Encounter  Procedures  . Osmolality, urine  . Sodium, urine, random  . Creatinine, urine, random  . Urinalysis, microscopic only  . COMPLETE METABOLIC PANEL WITH GFR  . CBC with Differential/Platelet  . Osmolality  . Hepatitis C antibody  . POCT Urinalysis Dipstick   No orders of the defined types were placed in this encounter.    Historical information moved to improve visibility of documentation.  Past Medical History:  Diagnosis Date  . Anemia   . Diabetes mellitus without complication ([place])   . Hypertension   . Normal cardiac stress test 06/09/2012   at [facility] normal cardiac stress test (surgery preop); Dr Norman Clay  . Vitamin D  deficiency    Past Surgical History:  Procedure Laterality Date  . BACK SURGERY     Social History   Tobacco Use  . Smoking status: Former Research scientist (life sciences)  . Smokeless tobacco: Never Used  Substance Use Topics  . Alcohol use: No   family history is not on file.  Medications: Current Outpatient Medications  Medication Sig Dispense Refill  . acetaminophen (TYLENOL) 325 MG tablet Take 650 mg by mouth as needed.    Marland Kitchen albuterol (PROVENTIL HFA;VENTOLIN HFA) 108 (90 Base) MCG/ACT inhaler Inhale 2 puffs into the lungs every 6 (six) hours as needed. 3 Inhaler 4  . amLODipine (NORVASC) 5 MG tablet TAKE 1 TABLET DAILY 90 tablet 4  . ammonium lactate (LAC-HYDRIN) 12 % lotion Apply to affected area daily 500 g 3  . colchicine 0.6 MG tablet Take 1 tablet (0.6 mg total) by mouth 2 (two) times daily. 180 tablet 3  . diazepam (VALIUM) 10 MG tablet Take 10 mg by mouth as needed.    Marland Kitchen estrogen, conjugated,-medroxyprogesterone (PREMPRO) 0.625-5 MG per tablet Take 1 tablet by mouth daily.      Marland Kitchen gabapentin (NEURONTIN) 300 MG capsule Take 300 mg by mouth 2 (two) times daily as needed.    Marland Kitchen glimepiride (AMARYL) 4 MG tablet Take 1 tablet (4 mg total) by mouth 2 (two) times daily. 180 tablet 3  . Glucose Blood (BLOOD GLUCOSE TEST STRIPS) STRP Use to check blood sugars three (3) times daily  DX E11.9 and Z79.4 400 each 4  . Insulin Isophane & Regular Human (HUMULIN 70/30 KWIKPEN) (70-30) 100 UNIT/ML PEN 34 units in AM and 32 units in evening. 45 mL 4  . Insulin Pen Needle (PEN NEEDLES 31GX5/16") 31G X 8 MM MISC Use with insulin three (3) times daily  DX E11.9 and Z79.4 300 each 4  . levothyroxine (SYNTHROID, LEVOTHROID) 50 MCG tablet Take 1 tablet (50 mcg total) by mouth 2 (two) times a week. On Sunday and Wednesday. Alternating with 75 mcg the other 5 days 90 tablet 1  . levothyroxine (SYNTHROID, LEVOTHROID) 75 MCG tablet Take 1 tablet (75 mcg total) by mouth daily. 90 tablet 3  . liraglutide (VICTOZA) 18 MG/3ML SOPN  Inject 0.1 mLs (0.6 mg total) into the skin daily. 9 mL 1  . lisinopril (PRINIVIL,ZESTRIL) 40 MG tablet TAKE 1 TABLET DAILY 90 tablet 3  . montelukast (SINGULAIR) 10 MG tablet Take 1 tablet (10 mg total) by mouth at bedtime. 90 tablet 3  . NARCAN 4 MG/0.1ML LIQD nasal spray kit CALL 911. ADMINISTER A SINGLE SPRAY OF NARCAN IN ONE NOSTRIL. REPEAT EVERY 3 MINUTES AS NEEDED IF NO OR MINIMAL RESPONSE.  0  . Oxycodone HCl 10 MG TABS     . OXYCONTIN 20 MG 12 hr tablet     . polyethylene glycol (MIRALAX /  GLYCOLAX) packet Take 17 g by mouth as needed.    . ranitidine (ZANTAC) 300 MG tablet Take 1 tablet (300 mg total) by mouth 2 (two) times daily. 180 tablet 3  . sitaGLIPtin (JANUVIA) 25 MG tablet Take 1 tablet (25 mg total) by mouth daily. 90 tablet 3  . Vitamin D, Ergocalciferol, (DRISDOL) 50000 units CAPS capsule TAKE 1 CAPSULE ONCE A WEEK 30 capsule 4   No current facility-administered medications for this visit.    Allergies  Allergen Reactions  . Amoxicillin Diarrhea and Itching    Other reaction(s): Confusion (intolerance)  . Atorvastatin Other (See Comments)    Burning skin sensation  . Hydrochlorothiazide     Other reaction(s): Other Chronic renal failure  . Nsaids Other (See Comments)    Peptic ulcer disease  . Tramadol Nausea And Vomiting    Other reaction(s): GI Upset (intolerance)  . Morphine Other (See Comments)    Hallucinations  . Omeprazole Diarrhea  . Trulicity [Dulaglutide] Other (See Comments)  . Ciprofloxacin     Other reaction(s): GI Upset (intolerance) Other reaction(s): Abdominal Pain Other reaction(s): Abdominal Pain  . Gabapentin Nausea Only    Feels "stupid" and wt gain   . Pantoprazole Diarrhea     Discussed warning signs or symptoms. Please see discharge instructions. Patient expresses understanding.  I personally was present and performed or re-performed the history, physical exam and medical decision-making activities of this service and have verified  that the service and findings are accurately documented in the student's note. ___________________________________________ Lynne Leader M.D., ABFM., CAQSM. Primary Care and Sports Medicine Adjunct Instructor of [employer]

## 2018-05-01 NOTE — Patient Instructions (Addendum)
Thank you for coming in today. Restart medicine for bladder spasm. Let Barbara Yu know what it is so we can get you more of it if it works or something else if it does work or make you feel bad.   I will get some more tests to help figure this out.   Keep Barbara Yu up dated.     Urinary Frequency, Adult Urinary frequency means urinating more often than usual. You may urinate every 1-2 hours even though you drink a normal amount of fluid and do not have a bladder infection or condition. Although you urinate more often than normal, the total amount of urine produced in a day is normal. With urinary frequency, you may have an urgent need to urinate often. The stress and anxiety of needing to find a bathroom quickly can make this urge worse. This condition may go away on its own or you may need treatment at home. Home treatment may include bladder training, exercises, taking medicines, or making changes to your diet. Follow these instructions at home: Bladder health   Keep a bladder diary if told by your health care provider. Keep track of: ? What you eat and drink. ? How often you urinate. ? How much you urinate.  Follow a bladder training program if told by your health care provider. This may include: ? Learning to delay going to the bathroom. ? Double urinating (voiding). This helps if you are not completely emptying your bladder. ? Scheduled voiding.  Do Kegel exercises as told by your health care provider. Kegel exercises strengthen the muscles that help control urination, which may help the condition. Eating and drinking  If told by your health care provider, make diet changes, such as: ? Avoiding caffeine. ? Drinking fewer fluids, especially alcohol. ? Not drinking in the evening. ? Avoiding foods or drinks that may irritate the bladder. These include coffee, tea, soda, artificial sweeteners, citrus, tomato-based foods, and chocolate. ? Eating foods that help prevent or ease constipation.  Constipation can make this condition worse. Your health care provider may recommend that you:  Drink enough fluid to keep your urine pale yellow.  Take over-the-counter or prescription medicines.  Eat foods that are high in fiber, such as beans, whole grains, and fresh fruits and vegetables.  Limit foods that are high in fat and processed sugars, such as fried or sweet foods. General instructions  Take over-the-counter and prescription medicines only as told by your health care provider.  Keep all follow-up visits as told by your health care provider. This is important. Contact a health care provider if:  You start urinating more often.  You feel pain or irritation when you urinate.  You notice blood in your urine.  Your urine looks cloudy.  You develop a fever.  You begin vomiting. Get help right away if:  You are unable to urinate. Summary  Urinary frequency means urinating more often than usual. With urinary frequency, you may urinate every 1-2 hours even though you drink a normal amount of fluid and do not have a bladder infection or other bladder condition.  Your health care provider may recommend that you keep a bladder diary, follow a bladder training program, or make dietary changes.  If told by your health care provider, do Kegel exercises to strengthen the muscles that help control urination.  Take over-the-counter and prescription medicines only as told by your health care provider.  Contact a health care provider if your symptoms do not improve or get worse.  This information is not intended to replace advice given to you by your health care provider. Make sure you discuss any questions you have with your health care provider. Document Released: 02/03/2009 Document Revised: 10/17/2017 Document Reviewed: 10/17/2017 Elsevier Interactive Patient Education  2019 [employer].

## 2018-05-02 LAB — HEPATITIS C ANTIBODY
Hepatitis C Ab: NONREACTIVE
SIGNAL TO CUT-OFF: 0.02 (ref <1.00)

## 2018-05-03 LAB — COMPLETE METABOLIC PANEL WITH GFR
AG RATIO: 1.6 (calc) (ref 1.0–2.5)
ALT: 62 U/L — AB (ref 6–29)
AST: 77 U/L — AB (ref 10–35)
Albumin: 4.4 g/dL (ref 3.6–5.1)
Alkaline phosphatase (APISO): 64 U/L (ref 33–130)
BUN/Creatinine Ratio: 12 (calc) (ref 6–22)
BUN: 19 mg/dL (ref 7–25)
CALCIUM: 9.8 mg/dL (ref 8.6–10.4)
CO2: 21 mmol/L (ref 20–32)
CREATININE: 1.59 mg/dL — AB (ref 0.60–0.93)
Chloride: 105 mmol/L (ref 98–110)
GFR, EST AFRICAN AMERICAN: 37 mL/min/{1.73_m2} — AB (ref >=60)
GFR, EST NON AFRICAN AMERICAN: 32 mL/min/{1.73_m2} — AB (ref >=60)
GLUCOSE: 176 mg/dL — AB (ref 65–139)
Globulin: 2.7 g/dL (calc) (ref 1.9–3.7)
POTASSIUM: 4.5 mmol/L (ref 3.5–5.3)
Sodium: 136 mmol/L (ref 135–146)
TOTAL PROTEIN: 7.1 g/dL (ref 6.1–8.1)
Total Bilirubin: 0.4 mg/dL (ref 0.2–1.2)

## 2018-05-03 LAB — CBC WITH DIFFERENTIAL/PLATELET
Absolute Monocytes: 570 cells/uL (ref 200–950)
BASOS ABS: 70 {cells}/uL (ref 0–200)
BASOS PCT: 1.1 %
EOS ABS: 314 {cells}/uL (ref 15–500)
Eosinophils Relative: 4.9 %
HEMATOCRIT: 34.9 % — AB (ref 35.0–45.0)
HEMOGLOBIN: 11 g/dL — AB (ref 11.7–15.5)
LYMPHS ABS: 2938 {cells}/uL (ref 850–3900)
MCH: 23.8 pg — ABNORMAL LOW (ref 27.0–33.0)
MCHC: 31.5 g/dL — AB (ref 32.0–36.0)
MCV: 75.4 fL — ABNORMAL LOW (ref 80.0–100.0)
MONOS PCT: 8.9 %
NEUTROS ABS: 2509 {cells}/uL (ref 1500–7800)
Neutrophils Relative %: 39.2 %
Platelets: 142 10*3/uL (ref 140–400)
RBC: 4.63 10*6/uL (ref 3.80–5.10)
RDW: 16.5 % — ABNORMAL HIGH (ref 11.0–15.0)
Total Lymphocyte: 45.9 %
WBC: 6.4 10*3/uL (ref 3.8–10.8)

## 2018-05-03 LAB — OSMOLALITY: Osmolality: 302 mOsm/kg (ref 278–305)

## 2018-05-03 LAB — SODIUM, URINE, RANDOM: SODIUM UR: 108 mmol/L (ref 28–272)

## 2018-05-03 LAB — URINALYSIS, MICROSCOPIC ONLY
Bacteria, UA: NONE SEEN /HPF
HYALINE CAST: NONE SEEN /LPF
RBC / HPF: NONE SEEN /HPF (ref 0–2)
Squamous Epithelial / LPF: NONE SEEN /HPF (ref <=5)
WBC UA: NONE SEEN /HPF (ref 0–5)

## 2018-05-03 LAB — OSMOLALITY, URINE: OSMOLALITY UR: 542 mosm/kg (ref 50–1200)

## 2018-05-05 ENCOUNTER — Telehealth: Payer: Self-pay | Admitting: Family Medicine

## 2018-05-05 NOTE — Telephone Encounter (Signed)
Medication patient taking was Enablex

## 2018-05-06 ENCOUNTER — Ambulatory Visit (INDEPENDENT_AMBULATORY_CARE_PROVIDER_SITE_OTHER): Payer: Medicare Other | Admitting: Family Medicine

## 2018-05-06 ENCOUNTER — Encounter: Payer: Self-pay | Admitting: Family Medicine

## 2018-05-06 VITALS — BP 108/51 | HR 79 | Ht 64.0 in | Wt 160.0 lb

## 2018-05-06 DIAGNOSIS — R102 Pelvic and perineal pain: Secondary | ICD-10-CM

## 2018-05-06 DIAGNOSIS — R350 Frequency of micturition: Secondary | ICD-10-CM | POA: Diagnosis not present

## 2018-05-06 DIAGNOSIS — Z780 Asymptomatic menopausal state: Secondary | ICD-10-CM

## 2018-05-06 NOTE — Progress Notes (Signed)
Acute Office Visit  Subjective:    Patient ID: Barbara Yu, female    DOB: 12-31-1945, 73 y.o.   MRN: [id number]  Chief Complaint  Patient presents with  . Pelvic Pain    pt reports that the pain is throbbing like someone is standing on her pelvis she denies any pain today    HPI Patient is in today for Pelvic Pain.  Actually seen about 5 days ago by 1 of my partners for urinary frequency that have been going on for about 3 weeks at that time.  She been having frequency during the day as well as at night as well as a little bit of suprapubic pressure. UA was negative.  Her labs including urine and serum osmolality were normal.  There was a mild elevation in her liver enzymes. Hx a history of a uterine fibroid.  Pain is a throbbing sensation and bothers her mostly at night when she lays down.  Is not worse with walking or activity.  She denies any vaginal bleeding.  This feels like the suprapubic area is tender.  She says she was a little constipated last week but actually took some laxatives and says that seems to have resolved.  She denies any prior history of pelvic prolapse.  She is currently on Prempro and has been on this since the late 1990s.  She denies feeling like she drinks excessively in fact she has about 1 container of coffee about 10 to 12 ounces in the mornings and then drinks between 20 to 30 ounces of bottle of water per day.  It sounds like she drinks less than 60 ounces a day total. She had a normal pap smer in 2017 and had a CT abd/Pelvis 2014 and documented fibroid uterus   Past Medical History:  Diagnosis Date  . Anemia   . Diabetes mellitus without complication ([place])   . Hypertension   . Normal cardiac stress test 06/09/2012   at [facility] normal cardiac stress test (surgery preop); Dr Norman Clay  . Vitamin D deficiency     Past Surgical History:  Procedure Laterality Date  . BACK SURGERY      No family history on file.  Social History   Socioeconomic History   . Marital status: Widowed    Spouse name: Not on file  . Number of children: Not on file  . Years of education: Not on file  . Highest education level: Not on file  Occupational History  . Occupation: retired  Scientific laboratory technician  . Financial resource strain: Not on file  . Food insecurity:    Worry: Not on file    Inability: Not on file  . Transportation needs:    Medical: Not on file    Non-medical: Not on file  Tobacco Use  . Smoking status: Former Research scientist (life sciences)  . Smokeless tobacco: Never Used  Substance and Sexual Activity  . Alcohol use: No  . Drug use: No  . Sexual activity: Never  Lifestyle  . Physical activity:    Days per week: Not on file    Minutes per session: Not on file  . Stress: Not on file  Relationships  . Social connections:    Talks on phone: Not on file    Gets together: Not on file    Attends religious service: Not on file    Active member of club or organization: Not on file    Attends meetings of clubs or organizations: Not on file    Relationship  status: Not on file  . Intimate partner violence:    Fear of current or ex partner: Not on file    Emotionally abused: Not on file    Physically abused: Not on file    Forced sexual activity: Not on file  Other Topics Concern  . Not on file  Social History Narrative  . Not on file    Outpatient Medications Prior to Visit  Medication Sig Dispense Refill  . acetaminophen (TYLENOL) 325 MG tablet Take 650 mg by mouth as needed.    Marland Kitchen albuterol (PROVENTIL HFA;VENTOLIN HFA) 108 (90 Base) MCG/ACT inhaler Inhale 2 puffs into the lungs every 6 (six) hours as needed. 3 Inhaler 4  . amLODipine (NORVASC) 5 MG tablet TAKE 1 TABLET DAILY 90 tablet 4  . ammonium lactate (LAC-HYDRIN) 12 % lotion Apply to affected area daily 500 g 3  . colchicine 0.6 MG tablet Take 1 tablet (0.6 mg total) by mouth 2 (two) times daily. 180 tablet 3  . Darifenacin Hydrobromide (ENABLEX PO) Take by mouth.    . diazepam (VALIUM) 10 MG tablet  Take 10 mg by mouth as needed.    Marland Kitchen estrogen, conjugated,-medroxyprogesterone (PREMPRO) 0.625-5 MG per tablet Take 1 tablet by mouth daily.      Marland Kitchen gabapentin (NEURONTIN) 300 MG capsule Take 300 mg by mouth 2 (two) times daily as needed.    Marland Kitchen glimepiride (AMARYL) 4 MG tablet Take 1 tablet (4 mg total) by mouth 2 (two) times daily. 180 tablet 3  . Glucose Blood (BLOOD GLUCOSE TEST STRIPS) STRP Use to check blood sugars three (3) times daily  DX E11.9 and Z79.4 400 each 4  . Insulin Isophane & Regular Human (HUMULIN 70/30 KWIKPEN) (70-30) 100 UNIT/ML PEN 34 units in AM and 32 units in evening. 45 mL 4  . Insulin Pen Needle (PEN NEEDLES 31GX5/16") 31G X 8 MM MISC Use with insulin three (3) times daily  DX E11.9 and Z79.4 300 each 4  . levothyroxine (SYNTHROID, LEVOTHROID) 50 MCG tablet Take 1 tablet (50 mcg total) by mouth 2 (two) times a week. On Sunday and Wednesday. Alternating with 75 mcg the other 5 days 90 tablet 1  . levothyroxine (SYNTHROID, LEVOTHROID) 75 MCG tablet Take 1 tablet (75 mcg total) by mouth daily. 90 tablet 3  . liraglutide (VICTOZA) 18 MG/3ML SOPN Inject 0.1 mLs (0.6 mg total) into the skin daily. 9 mL 1  . lisinopril (PRINIVIL,ZESTRIL) 40 MG tablet TAKE 1 TABLET DAILY 90 tablet 3  . montelukast (SINGULAIR) 10 MG tablet Take 1 tablet (10 mg total) by mouth at bedtime. 90 tablet 3  . NARCAN 4 MG/0.1ML LIQD nasal spray kit CALL 911. ADMINISTER A SINGLE SPRAY OF NARCAN IN ONE NOSTRIL. REPEAT EVERY 3 MINUTES AS NEEDED IF NO OR MINIMAL RESPONSE.  0  . Oxycodone HCl 10 MG TABS     . OXYCONTIN 20 MG 12 hr tablet     . polyethylene glycol (MIRALAX / GLYCOLAX) packet Take 17 g by mouth as needed.    . ranitidine (ZANTAC) 300 MG tablet Take 1 tablet (300 mg total) by mouth 2 (two) times daily. 180 tablet 3  . sitaGLIPtin (JANUVIA) 25 MG tablet Take 1 tablet (25 mg total) by mouth daily. 90 tablet 3  . Vitamin D, Ergocalciferol, (DRISDOL) 50000 units CAPS capsule TAKE 1 CAPSULE ONCE A WEEK  30 capsule 4   No facility-administered medications prior to visit.     Allergies  Allergen Reactions  . Amoxicillin  Diarrhea and Itching    Other reaction(s): Confusion (intolerance)  . Atorvastatin Other (See Comments)    Burning skin sensation  . Hydrochlorothiazide     Other reaction(s): Other Chronic renal failure  . Nsaids Other (See Comments)    Peptic ulcer disease  . Tramadol Nausea And Vomiting    Other reaction(s): GI Upset (intolerance)  . Morphine Other (See Comments)    Hallucinations  . Omeprazole Diarrhea  . Trulicity [Dulaglutide] Other (See Comments)  . Ciprofloxacin     Other reaction(s): GI Upset (intolerance) Other reaction(s): Abdominal Pain Other reaction(s): Abdominal Pain  . Gabapentin Nausea Only    Feels "stupid" and wt gain   . Pantoprazole Diarrhea    ROS     Objective:    Physical Exam  Constitutional: She is oriented to person, place, and time. She appears well-developed and well-nourished.  HENT:  Head: Normocephalic and atraumatic.  Eyes: Conjunctivae and EOM are normal.  Cardiovascular: Normal rate.  Pulmonary/Chest: Effort normal.  Genitourinary:    Genitourinary Comments: Speculum exam performed.  Did not see any abnormal lesions.  No abnormal discharge or bleeding.  She was tender on exam worse on the right suprapubic area versus the left.  The uterus feels retroflexed.she did have some mild anterior wall prolapse.    Neurological: She is alert and oriented to person, place, and time.  Skin: Skin is dry. No pallor.  Psychiatric: She has a normal mood and affect. Her behavior is normal.  Vitals reviewed.   BP (!) 108/51   Pulse 79   Ht 5' 4"  (1.626 m)   Wt 160 lb (72.6 kg)   SpO2 100%   BMI 27.46 kg/m  Wt Readings from Last 3 Encounters:  05/06/18 160 lb (72.6 kg)  05/01/18 160 lb (72.6 kg)  02/24/18 158 lb (71.7 kg)    Health Maintenance Due  Topic Date Due  . COLONOSCOPY  01/26/1994    There are no preventive  care reminders to display for this patient.   Lab Results  Component Value Date   TSH 1.08 11/25/2017   Lab Results  Component Value Date   WBC 6.4 05/01/2018   HGB 11.0 (L) 05/01/2018   HCT 34.9 (L) 05/01/2018   MCV 75.4 (L) 05/01/2018   PLT 142 05/01/2018   Lab Results  Component Value Date   NA 136 05/01/2018   K 4.5 05/01/2018   CO2 21 05/01/2018   GLUCOSE 176 (H) 05/01/2018   BUN 19 05/01/2018   CREATININE 1.59 (H) 05/01/2018   BILITOT 0.4 05/01/2018   ALKPHOS 57 11/28/2016   AST 77 (H) 05/01/2018   ALT 62 (H) 05/01/2018   PROT 7.1 05/01/2018   ALBUMIN 4.3 11/28/2016   CALCIUM 9.8 05/01/2018   Lab Results  Component Value Date   CHOL 190 04/29/2017   Lab Results  Component Value Date   HDL 40 (L) 04/29/2017   Lab Results  Component Value Date   [facility]  04/29/2017     Comment:     . LDL cholesterol not calculated. Triglyceride levels greater than 400 mg/dL invalidate calculated LDL results. . Reference range: <100 . Desirable range <100 mg/dL for primary prevention;   <70 mg/dL for patients with CHD or diabetic patients  with > or = 2 CHD risk factors. Marland Kitchen LDL-C is now calculated using the Martin-Hopkins  calculation, which is a validated novel method providing  better accuracy than the Friedewald equation in the  estimation of LDL-C.  Cresenciano Genre  et al. JAMA. 8006;349(49): 2061-2068  (http://education.[facility].com/faq/FAQ164)    Lab Results  Component Value Date   TRIG 518 (H) 04/29/2017   Lab Results  Component Value Date   CHOLHDL 4.8 04/29/2017   Lab Results  Component Value Date   HGBA1C 7.3 (A) 02/24/2018       Assessment & Plan:   Problem List Items Addressed This Visit    None    Visit Diagnoses    Pelvic pain in female    -  Primary   Relevant Orders   WET PREP FOR [place], YEAST, CLUE   US PELVIC COMPLETE WITH TRANSVAGINAL   Post-menopausal       Relevant Orders   US PELVIC COMPLETE WITH TRANSVAGINAL   Urine  frequency       Relevant Orders   US PELVIC COMPLETE WITH TRANSVAGINAL       Pelvic pain worse with position it seems to be worse when she is lying down at night.  She has gotten her bowels moving so that seems to be ruled out as far as constipation.  Performed.  Will call with results once available.   No orders of the defined types were placed in this encounter.    Beatrice Lecher, MD

## 2018-05-07 LAB — WET PREP FOR TRICH, YEAST, CLUE
MICRO NUMBER:: 54551
Specimen Quality: ADEQUATE

## 2018-05-07 MED ORDER — FLUCONAZOLE 150 MG PO TABS: 150.0000 mg | ORAL_TABLET | Freq: Once | ORAL | 0 refills | Status: AC

## 2018-05-07 NOTE — Addendum Note (Signed)
Addended by: Beatrice Lecher D on: 05/07/2018 07:49 AM   Modules accepted: Orders

## 2018-05-09 ENCOUNTER — Ambulatory Visit (HOSPITAL_BASED_OUTPATIENT_CLINIC_OR_DEPARTMENT_OTHER)
Admission: RE | Admit: 2018-05-09 | Discharge: 2018-05-09 | Disposition: A | Discharge status: Home / Self Care | Payer: Medicare Other | Source: Ambulatory Visit | Attending: Family Medicine | Admitting: Family Medicine

## 2018-05-09 DIAGNOSIS — R350 Frequency of micturition: Secondary | ICD-10-CM | POA: Diagnosis present

## 2018-05-09 DIAGNOSIS — R102 Pelvic and perineal pain: Secondary | ICD-10-CM | POA: Diagnosis present

## 2018-05-09 DIAGNOSIS — Z780 Asymptomatic menopausal state: Secondary | ICD-10-CM | POA: Insufficient documentation

## 2018-05-12 ENCOUNTER — Other Ambulatory Visit: Payer: Self-pay | Admitting: *Deleted

## 2018-05-12 MED ORDER — TERCONAZOLE 0.4 % VA CREA: 1.0000 | TOPICAL_CREAM | Freq: Every day | VAGINAL | 1 refills | Status: DC

## 2018-05-13 LAB — POCT URINALYSIS DIPSTICK
Bilirubin, UA: NEGATIVE
GLUCOSE UA: NEGATIVE
Ketones, UA: NEGATIVE
Nitrite, UA: POSITIVE
PH UA: 5.5 (ref 5.0–8.0)
Protein, UA: NEGATIVE
RBC UA: NEGATIVE
Spec Grav, UA: 1.015 (ref 1.010–1.025)
Urobilinogen, UA: 0.2 E.U./dL

## 2018-05-13 MED ORDER — CEPHALEXIN 500 MG PO CAPS: 500.0000 mg | ORAL_CAPSULE | Freq: Two times a day (BID) | ORAL | 0 refills | Status: DC

## 2018-05-13 NOTE — Addendum Note (Signed)
Addended by: Beatrice Lecher D on: 05/13/2018 08:09 AM   Modules accepted: Orders

## 2018-05-13 NOTE — Addendum Note (Signed)
Addended by: Teddy Spike on: 05/13/2018 07:49 AM   Modules accepted: Orders

## 2018-05-14 LAB — URINE CULTURE
MICRO NUMBER:: 84229
SPECIMEN QUALITY:: ADEQUATE

## 2018-05-15 ENCOUNTER — Other Ambulatory Visit: Payer: Self-pay | Admitting: Family Medicine

## 2018-05-15 DIAGNOSIS — R102 Pelvic and perineal pain: Secondary | ICD-10-CM

## 2018-06-11 ENCOUNTER — Other Ambulatory Visit: Payer: Self-pay | Admitting: *Deleted

## 2018-06-11 DIAGNOSIS — E119 Type 2 diabetes mellitus without complications: Secondary | ICD-10-CM

## 2018-06-11 DIAGNOSIS — Z794 Long term (current) use of insulin: Principal | ICD-10-CM

## 2018-06-11 MED ORDER — LIRAGLUTIDE 18 MG/3ML ~~LOC~~ SOPN: 0.6000 mg | PEN_INJECTOR | Freq: Every day | SUBCUTANEOUS | 1 refills | Status: DC

## 2018-06-24 ENCOUNTER — Other Ambulatory Visit: Payer: Self-pay | Admitting: Family Medicine

## 2018-06-24 DIAGNOSIS — Z794 Long term (current) use of insulin: Principal | ICD-10-CM

## 2018-06-24 DIAGNOSIS — E119 Type 2 diabetes mellitus without complications: Secondary | ICD-10-CM

## 2018-06-26 ENCOUNTER — Other Ambulatory Visit: Payer: Self-pay | Admitting: Family Medicine

## 2018-06-27 ENCOUNTER — Encounter: Payer: Self-pay | Admitting: Family Medicine

## 2018-06-27 ENCOUNTER — Ambulatory Visit (INDEPENDENT_AMBULATORY_CARE_PROVIDER_SITE_OTHER): Payer: Medicare Other | Admitting: Family Medicine

## 2018-06-27 VITALS — BP 126/66 | HR 88 | Ht 64.0 in | Wt 163.0 lb

## 2018-06-27 DIAGNOSIS — K5909 Other constipation: Secondary | ICD-10-CM

## 2018-06-27 DIAGNOSIS — E119 Type 2 diabetes mellitus without complications: Secondary | ICD-10-CM | POA: Diagnosis not present

## 2018-06-27 DIAGNOSIS — Z794 Long term (current) use of insulin: Secondary | ICD-10-CM

## 2018-06-27 DIAGNOSIS — I10 Essential (primary) hypertension: Secondary | ICD-10-CM

## 2018-06-27 DIAGNOSIS — R413 Other amnesia: Secondary | ICD-10-CM

## 2018-06-27 DIAGNOSIS — D509 Iron deficiency anemia, unspecified: Secondary | ICD-10-CM | POA: Diagnosis not present

## 2018-06-27 HISTORY — DX: Other amnesia: R41.3

## 2018-06-27 LAB — POCT GLYCOSYLATED HEMOGLOBIN (HGB A1C): Hemoglobin A1C: 7.5 % — AB (ref 4.0–5.6)

## 2018-06-27 MED ORDER — LACTULOSE 10 GM/15ML PO SOLN: 10.0000 g | Freq: Two times a day (BID) | ORAL | 3 refills | Status: DC | PRN

## 2018-06-27 NOTE — Progress Notes (Signed)
Subjective:    CC:   HPI:  Diabetes - no hypoglycemic events. No wounds or sores that are not healing well. No increased thirst or urination. Checking glucose at home. Taking medications as prescribed without any side effects.  She admits that she has been a little bit depressed since finding out that her daughter is going to be moving out.  Her daughter is going to be getting married soon and will no longer be in the home and she is just not been by herself almost ever as an adult and so she is a little bit nervous and scared about it but understands.  So she admits she has been doing a little stress eating.  She is not been exercising regularly but stays very active around the house.  So wanted to let me know that she has enrolled herself in a move study at [facility].  It has something to do with lower extremity strength and the elderly population.  It is to wear a fit bit and some type of monitor on her leg.  It sounds like there also can do some type of biopsy may be a muscle biopsy at some point.  Hypertension- Pt denies chest pain, SOB, dizziness, or heart palpitations.  Taking meds as directed w/o problems.  Denies medication side effects.    She also wanted to let me know what is going on with her gout.  She is been found to podiatry and they are recommending some type of IV infusion drug not quite sure what it is but she is been having some chronic pain in her big toe especially at night.  She is also taking Colcrys which seems to help but it is quite costly.  She does report that she feels like she is having problems with her memory.  She feels like it is been more gradual.  She said they did some type of memory test on her yesterday for the study and noticed that she was struggling.  She also has problems with chronic constipation since being on pain medications.  She is to the point where she does not feel like the MiraLAX is actually really working well and sometimes will get a lot of  pelvic discomfort because of it.  She has tried some of her husband's old lactulose.  He is deceased and says that things actually worked pretty well.  Past medical history, Surgical history, Family history not pertinant except as noted below, Social history, Allergies, and medications have been entered into the medical record, reviewed, and corrections made.   Review of Systems: No fevers, chills, night sweats, weight loss, chest pain, or shortness of breath.   Objective:    General: Well Developed, well nourished, and in no acute distress.  Neuro: Alert and oriented x3, extra-ocular muscles intact, sensation grossly intact.  HEENT: Normocephalic, atraumatic  Skin: Warm and dry, no rashes. Cardiac: Regular rate and rhythm, no murmurs rubs or gallops, no lower extremity edema.  Respiratory: Clear to auscultation bilaterally. Not using accessory muscles, speaking in full sentences.   Impression and Recommendations:    DM -Uncontrolled.  We discussed increasing the Victoza to 1.2 units.  Or for any increased nausea or weight loss.  Open 3 months.  Courage her to cut back on sweets.  She is been eating a lot of ginger snaps which are cookies.  Also encouraged her to really work on increasing her vegetables and protein and eating more regularly.  HTN - Well controlled. Continue  current regimen. Follow up in  3-4 months.    Constipation -she would like to try lactulose.  New prescription sent to pharmacy and we can see if this works better for her.  Memmory impairment -we did have her complete a Mini-Mental status exam today.  She scored a 27 out of 30.  Normal range based on her age and college degree a passing score is 28.  I do want keep an eye on this and plan to repeat again in 6 months.  Gout -following with podiatry.  They tried to get Uloric approved but it was not covered by the insurance.  She is also paying a lot for Colcrys but to check with the insurance and the caplet version is  not cheaper.  She is considering some type of IV infusion.  Iron deficiency-due to recheck labs.  Acute mild depression-she has been a little down and tearful about her daughter moving.  Offered to refer her to a therapist/counselor but she declined she feels like she will be okay at [facility] to take a little adjusting.

## 2018-06-27 NOTE — Patient Instructions (Signed)
Increase Victoza to 1.2 units daily.

## 2018-06-28 LAB — FERRITIN: Ferritin: 122 ng/mL (ref 16–288)

## 2018-06-28 LAB — TSH+FREE T4: TSH W/REFLEX TO FT4: 1.24 mIU/L (ref 0.40–4.50)

## 2018-06-28 LAB — LIPID PANEL
CHOLESTEROL: 183 mg/dL (ref <200)
HDL: 53 mg/dL (ref >=50)
LDL Cholesterol (Calc): 97 mg/dL (calc)
Non-HDL Cholesterol (Calc): 130 mg/dL (calc) — ABNORMAL HIGH (ref <130)
Total CHOL/HDL Ratio: 3.5 (calc) (ref <5.0)
Triglycerides: 209 mg/dL — ABNORMAL HIGH (ref <150)

## 2018-06-28 LAB — IRON: Iron: 104 ug/dL (ref 45–160)

## 2018-06-28 LAB — EXTRA LAV TOP TUBE

## 2018-07-01 ENCOUNTER — Telehealth: Payer: Self-pay | Admitting: *Deleted

## 2018-07-01 DIAGNOSIS — M1A0790 Idiopathic chronic gout, unspecified ankle and foot, without tophus (tophi): Secondary | ICD-10-CM

## 2018-07-01 NOTE — Telephone Encounter (Signed)
Pt would like a referral for a rheumatologist in [place]. She stated that she forgot to request this when she was here for her appointment Friday. Marland KitchenMaryruth Eve, [name], CMA

## 2018-07-07 ENCOUNTER — Other Ambulatory Visit: Payer: Self-pay | Admitting: *Deleted

## 2018-07-07 MED ORDER — ALLOPURINOL 100 MG PO TABS: 100.0000 mg | ORAL_TABLET | Freq: Every day | ORAL | 4 refills | Status: DC

## 2018-07-17 ENCOUNTER — Other Ambulatory Visit: Payer: Self-pay | Admitting: *Deleted

## 2018-07-29 ENCOUNTER — Other Ambulatory Visit: Payer: Self-pay | Admitting: *Deleted

## 2018-07-29 ENCOUNTER — Other Ambulatory Visit: Payer: Self-pay | Admitting: Family Medicine

## 2018-07-29 DIAGNOSIS — E119 Type 2 diabetes mellitus without complications: Secondary | ICD-10-CM

## 2018-07-29 DIAGNOSIS — Z794 Long term (current) use of insulin: Principal | ICD-10-CM

## 2018-07-29 MED ORDER — GLUCOSE BLOOD VI STRP: ORAL_STRIP | 4 refills | Status: DC

## 2018-07-29 MED ORDER — FAMOTIDINE 20 MG PO TABS: 20.0000 mg | ORAL_TABLET | Freq: Two times a day (BID) | ORAL | 1 refills | Status: DC

## 2018-07-29 NOTE — Progress Notes (Signed)
Ranitidine withdrawn from the market.  Patient okay to try famotidine.  New prescription sent to [facility] mail order.

## 2018-08-12 ENCOUNTER — Other Ambulatory Visit: Payer: Self-pay | Admitting: *Deleted

## 2018-08-12 DIAGNOSIS — E119 Type 2 diabetes mellitus without complications: Secondary | ICD-10-CM

## 2018-08-12 DIAGNOSIS — Z794 Long term (current) use of insulin: Principal | ICD-10-CM

## 2018-08-12 MED ORDER — LIRAGLUTIDE 18 MG/3ML ~~LOC~~ SOPN: 1.2000 mg | PEN_INJECTOR | Freq: Every day | SUBCUTANEOUS | 3 refills | Status: DC

## 2018-09-30 ENCOUNTER — Telehealth: Payer: Medicare Other | Admitting: Family Medicine

## 2018-10-07 ENCOUNTER — Encounter: Payer: Self-pay | Admitting: Family Medicine

## 2018-10-07 ENCOUNTER — Telehealth (INDEPENDENT_AMBULATORY_CARE_PROVIDER_SITE_OTHER): Payer: Medicare Other | Admitting: Family Medicine

## 2018-10-07 VITALS — BP 135/85 | HR 74 | Temp 97.4°F | Ht 64.0 in | Wt 161.0 lb

## 2018-10-07 DIAGNOSIS — Z794 Long term (current) use of insulin: Secondary | ICD-10-CM | POA: Diagnosis not present

## 2018-10-07 DIAGNOSIS — E119 Type 2 diabetes mellitus without complications: Secondary | ICD-10-CM | POA: Diagnosis not present

## 2018-10-07 DIAGNOSIS — K219 Gastro-esophageal reflux disease without esophagitis: Secondary | ICD-10-CM | POA: Diagnosis not present

## 2018-10-07 DIAGNOSIS — M1A0790 Idiopathic chronic gout, unspecified ankle and foot, without tophus (tophi): Secondary | ICD-10-CM | POA: Diagnosis not present

## 2018-10-07 NOTE — Progress Notes (Signed)
BS: 253 7-day=222 14-day=210 30-day=197  She reports that her BS have been running high. Last night (8 pm) it was 429. She increased the Victoza to 1.2 u QD as per Dr. Gardiner Ramus recommendation. And is taking the Humulin also as directed.  Last Monday around 330 AM pt had an episode of vomiting and diarrhea.  No problems since.   She began her treatment for Gout yesterday and did well with the infusion Normand Sloop).   Pt wanted to let Dr. Madilyn Fireman know that she cannot take the Pepcid. She stated that it caused her some to be more gassy and when she would belch it would be like rotten eggs.   Barbara Yu, [name]

## 2018-10-07 NOTE — Progress Notes (Signed)
Virtual Visit via Video Note  I connected with [name] on 10/07/18 at  1:00 PM EDT by a video enabled telemedicine application and verified that I am speaking with the correct person using two identifiers.   I discussed the limitations of evaluation and management by telemedicine and the availability of in person appointments. The patient expressed understanding and agreed to proceed.  Subjective:    CC: 3 mo f/u   Diabetes -she has had a few hypoglycemic events. No wounds or sores that are not healing well. No increased thirst or urination. Checking glucose at home. Taking medications as prescribed without any side effects.  She does take her Humulin 70/30 in the morning and before her evening meal.  She says yesterday in the evening before bedtime she checked it and it was in the 400s.  She says that the doctor did warn her that the new infusion for her gout could raise her blood sugars.  She says she took some extra insulin before bedtime.  He says overall her sugars have been running in the 200s which is not normal for her.  Lab Results  Component Value Date   HGBA1C 7.5 (A) 06/27/2018    HPI: BS: 253 7-day=222 14-day=210 30-day=197  She reports that her BS have been running high. Last night (8 pm) it was 429. She increased the Victoza to 1.2 u QD as per Dr. Gardiner Ramus recommendation. And is taking the Humulin also as directed.  Last Monday around 330 AM pt had an episode of vomiting and diarrhea.  No problems since.   F/U gout - She began her treatment for Gout yesterday and did well with the infusion Normand Sloop).   GERD - Pt wanted to let Dr. Madilyn Fireman know that she cannot take the Pepcid. She stated that it caused her some to be more gassy and when she would belch it would be like rotten eggs. Says the probiotic has really helped.   Follow-up chronic constipation-she was getting some constipation because of her pain medications.  She felt like the MiraLAX just was  not working really well anymore and had tried her deceased husband's old lactulose and felt like it was helpful so we had sent a prescription in for that back in March.   Past medical history, Surgical history, Family history not pertinant except as noted below, Social history, Allergies, and medications have been entered into the medical record, reviewed, and corrections made.   Review of Systems: No fevers, chills, night sweats, weight loss, chest pain, or shortness of breath.   Objective:    General: Speaking clearly in complete sentences without any shortness of breath.  Alert and oriented x3.  Normal judgment. No apparent acute distress.    Impression and Recommendations:   DM -sounds like glucose is more recently have been elevated.  She is due for A1c so we will see what that looks like.  Encouraged her to go ahead and increase her Humulin to 36 units in the morning and 30 4 in the evening.  And to call back in a few days and let me know what her blood sugars are doing.  I am not sure what kind of effect this new medication is going to have.  Plus she was also interested in possibly separating out her insulin so we discussed how we could potentially use the long-acting insulin and then just use mealtime separately since she sometimes only eats once or twice a day with maybe a small snack.  And  she does not eat consistently or at the same time which makes it a little bit more challenging with her Humulin.  He also has had some hypoglycemic events.  GERD -she did not do well on the Pepcid increased gas and bloating but Sosan starting the probiotic she actually feels better so for now she wants to just take with a probiotic.  If she continues to have problems then consider putting her on a PPI.  Gout -on new infusion treatment with Krystexxa.  She has been told to continue her allopurinol and has been taking her colchicine as well.  She says the allopurinol hurts her stomach so she really has  not been taking it.  Chronic constipation-doing better with the lactulose instead of the MiraLAX but says she does have to take 60 ml for it to be effective and says if she does that every day to every other day it seems to work well.  When she is due for refill we may need to adjust her dose on that.  I discussed the assessment and treatment plan with the patient. The patient was provided an opportunity to ask questions and all were answered. The patient agreed with the plan and demonstrated an understanding of the instructions.   The patient was advised to call back or seek an in-person evaluation if the symptoms worsen or if the condition fails to improve as anticipated.   Beatrice Lecher, MD

## 2018-10-15 ENCOUNTER — Other Ambulatory Visit: Payer: Self-pay | Admitting: *Deleted

## 2018-10-15 DIAGNOSIS — J209 Acute bronchitis, unspecified: Secondary | ICD-10-CM

## 2018-10-15 MED ORDER — ALBUTEROL SULFATE HFA 108 (90 BASE) MCG/ACT IN AERS: 2.0000 | INHALATION_SPRAY | Freq: Four times a day (QID) | RESPIRATORY_TRACT | 4 refills | Status: DC | PRN

## 2018-10-16 ENCOUNTER — Encounter: Payer: Self-pay | Admitting: Family Medicine

## 2018-10-22 ENCOUNTER — Encounter: Payer: Self-pay | Admitting: Family Medicine

## 2018-10-23 MED ORDER — NITROFURANTOIN MONOHYD MACRO 100 MG PO CAPS: 100.0000 mg | ORAL_CAPSULE | Freq: Two times a day (BID) | ORAL | 0 refills | Status: DC

## 2018-10-27 ENCOUNTER — Other Ambulatory Visit: Payer: Self-pay | Admitting: Family Medicine

## 2018-10-27 MED ORDER — AMLODIPINE BESYLATE-VALSARTAN 5-320 MG PO TABS: 1.0000 | ORAL_TABLET | Freq: Every day | ORAL | 0 refills | Status: DC

## 2018-10-27 NOTE — Progress Notes (Signed)
Will d/c amlodipine and lisinopril and change to amlodipine/valsartan. Sent to mail order.

## 2018-11-28 LAB — CBC AND DIFFERENTIAL
HCT: 32 — AB (ref 36–46)
Hemoglobin: 9.9 — AB (ref 12.0–16.0)
Platelets: 152 (ref 150–399)
WBC: 6.2

## 2018-11-28 LAB — HEMOGLOBIN A1C: Hemoglobin A1C: 7.1

## 2018-12-01 ENCOUNTER — Telehealth: Payer: Self-pay | Admitting: Family Medicine

## 2018-12-01 NOTE — Telephone Encounter (Addendum)
Please call pt: you had labs done through he [place] study and they did a blood count showing your hemoglobin is low at 9.9. that is a big drop from January when it was 11. Since last iron levels were normal in March. Has she noticed any blood in the urine or stool.

## 2018-12-03 MED ORDER — FUSION PLUS PO CAPS: 1.0000 | ORAL_CAPSULE | Freq: Every day | ORAL | 3 refills | Status: DC

## 2018-12-03 NOTE — Telephone Encounter (Signed)
Ok , send in new script.  Let try the iron for 6 weeks and then check labs again. If iron and hemoglobin not coming  up then we will need to get hematology involved.

## 2018-12-03 NOTE — Telephone Encounter (Signed)
Pt stated that she has not noticed in her stool or urine. She said she really hasn't been eating right. She wanted to know if Dr. Madilyn Fireman can send in a prescription for an iron supplement that wont cause any GI upset or constipation. Please send to her mail order.Barbara Yu, [name], CMA

## 2018-12-04 NOTE — Telephone Encounter (Signed)
Barbara Yu spoke with patient. KG LPN

## 2019-01-01 ENCOUNTER — Other Ambulatory Visit: Payer: Self-pay | Admitting: *Deleted

## 2019-01-01 DIAGNOSIS — Z794 Long term (current) use of insulin: Secondary | ICD-10-CM

## 2019-01-01 DIAGNOSIS — E119 Type 2 diabetes mellitus without complications: Secondary | ICD-10-CM

## 2019-01-01 DIAGNOSIS — E559 Vitamin D deficiency, unspecified: Secondary | ICD-10-CM

## 2019-01-01 DIAGNOSIS — J209 Acute bronchitis, unspecified: Secondary | ICD-10-CM

## 2019-01-01 MED ORDER — ALBUTEROL SULFATE HFA 108 (90 BASE) MCG/ACT IN AERS: 2.0000 | INHALATION_SPRAY | Freq: Four times a day (QID) | RESPIRATORY_TRACT | 4 refills | Status: DC | PRN

## 2019-01-01 MED ORDER — VITAMIN D (ERGOCALCIFEROL) 1.25 MG (50000 UNIT) PO CAPS: ORAL_CAPSULE | ORAL | 4 refills | Status: DC

## 2019-01-01 MED ORDER — LEVOTHYROXINE SODIUM 75 MCG PO TABS: 75.0000 ug | ORAL_TABLET | Freq: Every day | ORAL | 3 refills | Status: DC

## 2019-01-01 MED ORDER — MONTELUKAST SODIUM 10 MG PO TABS: 10.0000 mg | ORAL_TABLET | Freq: Every day | ORAL | 3 refills | Status: DC

## 2019-01-01 MED ORDER — AMLODIPINE BESYLATE-VALSARTAN 5-320 MG PO TABS: 1.0000 | ORAL_TABLET | Freq: Every day | ORAL | 3 refills | Status: DC

## 2019-01-01 MED ORDER — SITAGLIPTIN PHOSPHATE 25 MG PO TABS: 25.0000 mg | ORAL_TABLET | Freq: Every day | ORAL | 3 refills | Status: DC

## 2019-01-05 DIAGNOSIS — E875 Hyperkalemia: Secondary | ICD-10-CM

## 2019-01-05 HISTORY — DX: Hyperkalemia: E87.5

## 2019-01-22 ENCOUNTER — Encounter: Payer: Self-pay | Admitting: Family Medicine

## 2019-02-23 ENCOUNTER — Ambulatory Visit (INDEPENDENT_AMBULATORY_CARE_PROVIDER_SITE_OTHER): Payer: Medicare Other | Admitting: Family Medicine

## 2019-02-23 ENCOUNTER — Encounter: Payer: Self-pay | Admitting: Family Medicine

## 2019-02-23 ENCOUNTER — Other Ambulatory Visit: Payer: Self-pay

## 2019-02-23 ENCOUNTER — Telehealth: Payer: Self-pay

## 2019-02-23 VITALS — BP 116/52 | HR 86 | Ht 64.0 in | Wt 158.0 lb

## 2019-02-23 DIAGNOSIS — E119 Type 2 diabetes mellitus without complications: Secondary | ICD-10-CM | POA: Diagnosis not present

## 2019-02-23 DIAGNOSIS — N949 Unspecified condition associated with female genital organs and menstrual cycle: Secondary | ICD-10-CM | POA: Diagnosis not present

## 2019-02-23 DIAGNOSIS — Z794 Long term (current) use of insulin: Secondary | ICD-10-CM | POA: Diagnosis not present

## 2019-02-23 DIAGNOSIS — D509 Iron deficiency anemia, unspecified: Secondary | ICD-10-CM

## 2019-02-23 DIAGNOSIS — M1A0790 Idiopathic chronic gout, unspecified ankle and foot, without tophus (tophi): Secondary | ICD-10-CM

## 2019-02-23 DIAGNOSIS — M25462 Effusion, left knee: Secondary | ICD-10-CM

## 2019-02-23 DIAGNOSIS — E038 Other specified hypothyroidism: Secondary | ICD-10-CM

## 2019-02-23 DIAGNOSIS — I10 Essential (primary) hypertension: Secondary | ICD-10-CM

## 2019-02-23 DIAGNOSIS — M25562 Pain in left knee: Secondary | ICD-10-CM

## 2019-02-23 LAB — POCT GLYCOSYLATED HEMOGLOBIN (HGB A1C): Hemoglobin A1C: 6.3 % — AB (ref 4.0–5.6)

## 2019-02-23 LAB — POCT URINALYSIS DIPSTICK
Bilirubin, UA: NEGATIVE
Blood, UA: NEGATIVE
Glucose, UA: NEGATIVE
Ketones, UA: NEGATIVE
Leukocytes, UA: NEGATIVE
Nitrite, UA: NEGATIVE
Protein, UA: NEGATIVE
Spec Grav, UA: >=1.03 — AB (ref 1.010–1.025)
Urobilinogen, UA: 0.2 E.U./dL
pH, UA: 5.5 (ref 5.0–8.0)

## 2019-02-23 MED ORDER — SHINGRIX 50 MCG/0.5ML IM SUSR: 0.5000 mL | Freq: Once | INTRAMUSCULAR | 0 refills | Status: AC

## 2019-02-23 MED ORDER — COLCHICINE 0.6 MG PO TABS: 0.6000 mg | ORAL_TABLET | Freq: Every day | ORAL | 0 refills | Status: DC | PRN

## 2019-02-23 MED ORDER — ESTRADIOL 0.1 MG/GM VA CREA: 1.0000 | TOPICAL_CREAM | VAGINAL | 3 refills | Status: DC

## 2019-02-23 NOTE — Assessment & Plan Note (Signed)
Due to recheck thyroid level.  

## 2019-02-23 NOTE — Progress Notes (Signed)
 Established Patient Office Visit  Subjective:  Patient ID: Barbara Yu, female    DOB: 04/09/1946  Age: 73 y.o. MRN: [id number]  CC:  Chief Complaint  Patient presents with  . Diabetes  . Hypertension  . Hypothyroidism  . Insomnia    HPI Barbara Yu presents for  Hypertension- Pt denies chest pain, SOB, dizziness, or heart palpitations.  Taking meds as directed w/o problems.  Denies medication side effects.    Diabetes - no hypoglycemic events. No wounds or sores that are not healing well. No increased thirst or urination. Checking glucose at home.  She did bring in home glucose log.  She is having quite a few lows in the 50s but then she is also having some elevated sugars all the way up to 300.  She admits that she is eating erratically and does not eat 3 meals a day.  Taking medications as prescribed without any side effects.  Burning in the vaginal area.  No dysuria.  She says she restarted her Prempro and it actually seems to feel little better she has not had any discharge.  No fevers chills or sweats.  Not sleeping well for the past year.  Sleeps about 3-4 hours and then wakes up.  He is not actively exercising right now.  CKD 3-does have an appointment/follow-up with nephrology on November 19.  Had her levels checked recently through the rheumatologist.  Left knee pain - she says a few weeks ago her left knee was quite swollen and painful.  She says the swelling has gradually gone down and is gotten a little bit better.  She said she was told at one point that she had bone-on-bone in her right knee years ago.  Hypothyroidism - Taking medication regularly in the AM away from food and vitamins, etc. No recent change to skin, hair, or energy levels.   Past Medical History:  Diagnosis Date  . Anemia   . Diabetes mellitus without complication (HCC)   . Hypertension   . Normal cardiac stress test 06/09/2012   at [facility] normal cardiac stress test (surgery preop); Dr  Upadhya  . Vitamin D deficiency     Past Surgical History:  Procedure Laterality Date  . BACK SURGERY      No family history on file.  Social History   Socioeconomic History  . Marital status: Widowed    Spouse name: Not on file  . Number of children: Not on file  . Years of education: Not on file  . Highest education level: Not on file  Occupational History  . Occupation: retired  Social Needs  . Financial resource strain: Not on file  . Food insecurity    Worry: Not on file    Inability: Not on file  . Transportation needs    Medical: Not on file    Non-medical: Not on file  Tobacco Use  . Smoking status: Former Smoker  . Smokeless tobacco: Never Used  Substance and Sexual Activity  . Alcohol use: No  . Drug use: No  . Sexual activity: Never  Lifestyle  . Physical activity    Days per week: Not on file    Minutes per session: Not on file  . Stress: Not on file  Relationships  . Social connections    Talks on phone: Not on file    Gets together: Not on file    Attends religious service: Not on file    Active member of club or organization: Not on   file    Attends meetings of clubs or organizations: Not on file    Relationship status: Not on file  . Intimate partner violence    Fear of current or ex partner: Not on file    Emotionally abused: Not on file    Physically abused: Not on file    Forced sexual activity: Not on file  Other Topics Concern  . Not on file  Social History Narrative  . Not on file    Outpatient Medications Prior to Visit  Medication Sig Dispense Refill  . acetaminophen (TYLENOL) 325 MG tablet Take 650 mg by mouth as needed.    . albuterol (VENTOLIN HFA) 108 (90 Base) MCG/ACT inhaler Inhale 2 puffs into the lungs every 6 (six) hours as needed. 54 g 4  . amLODipine-valsartan (EXFORGE) 5-320 MG tablet Take 1 tablet by mouth daily. 90 tablet 3  . Darifenacin Hydrobromide (ENABLEX PO) Take by mouth.    . diazepam (VALIUM) 10 MG tablet  Take 10 mg by mouth as needed.    . estrogen, conjugated,-medroxyprogesterone (PREMPRO) 0.625-5 MG per tablet Take 1 tablet by mouth daily.      . Ferrous Sulfate (IRON PO) Take by mouth.    . glimepiride (AMARYL) 4 MG tablet TAKE 1 TABLET TWICE A DAY 180 tablet 4  . glucose blood (FREESTYLE LITE) test strip For testing 3 times daily. DX: E11.9 300 each 4  . Insulin Isophane & Regular Human (HUMULIN 70/30 KWIKPEN) (70-30) 100 UNIT/ML PEN 34 units in AM and 32 units in evening. 45 mL 4  . Insulin Pen Needle (PEN NEEDLES 31GX5/16") 31G X 8 MM MISC Use with insulin three (3) times daily  DX E11.9 and Z79.4 300 each 4  . lactulose (CHRONULAC) 10 GM/15ML solution Take 15-30 mLs (10-20 g total) by mouth 2 (two) times daily as needed for mild constipation. 1892 mL 3  . levothyroxine (SYNTHROID) 75 MCG tablet Take 1 tablet (75 mcg total) by mouth daily. 90 tablet 3  . levothyroxine (SYNTHROID, LEVOTHROID) 50 MCG tablet TAKE 1 TABLET TWO TIMES A WEEK. ON SUNDAY AND WEDNESDAY. ALTERNATING WITH 75 MCG THE OTHER 5 DAYS 90 tablet 4  . liraglutide (VICTOZA) 18 MG/3ML SOPN Inject 0.2 mLs (1.2 mg total) into the skin daily. 18 mL 3  . Magnesium 250 MG TABS Take by mouth.    . montelukast (SINGULAIR) 10 MG tablet Take 1 tablet (10 mg total) by mouth at bedtime. 90 tablet 3  . NARCAN 4 MG/0.1ML LIQD nasal spray kit CALL 911. ADMINISTER A SINGLE SPRAY OF NARCAN IN ONE NOSTRIL. REPEAT EVERY 3 MINUTES AS NEEDED IF NO OR MINIMAL RESPONSE.  0  . Oxycodone HCl 10 MG TABS     . OXYCONTIN 20 MG 12 hr tablet     . Pegloticase (KRYSTEXXA IV) Inject into the vein every 14 (fourteen) days.    . polyethylene glycol (MIRALAX / GLYCOLAX) packet Take 17 g by mouth as needed.    . sitaGLIPtin (JANUVIA) 25 MG tablet Take 1 tablet (25 mg total) by mouth daily. 90 tablet 3  . terconazole (TERAZOL 7) 0.4 % vaginal cream INSERT 1 APPLICATORFUL VAGINALLY AT BEDTIME    . Vitamin D, Ergocalciferol, (DRISDOL) 1.25 MG (50000 UT) CAPS  capsule TAKE 1 CAPSULE ONCE A WEEK 30 capsule 4  . colchicine 0.6 MG tablet Take 1 tablet (0.6 mg total) by mouth 2 (two) times daily. 180 tablet 3  . allopurinol (ZYLOPRIM) 300 MG tablet Take 1 tablet by   mouth daily.    Marland Kitchen gabapentin (NEURONTIN) 300 MG capsule Take 300 mg by mouth 2 (two) times daily as needed.    . Iron-FA-B Cmp-C-Biot-Probiotic (FUSION PLUS) CAPS Take 1 capsule by mouth daily. 90 capsule 3  . nitrofurantoin, macrocrystal-monohydrate, (MACROBID) 100 MG capsule Take 1 capsule (100 mg total) by mouth 2 (two) times daily. 10 capsule 0   No facility-administered medications prior to visit.     Allergies  Allergen Reactions  . Amoxicillin Diarrhea and Itching    Other reaction(s): Confusion (intolerance)  . Atorvastatin Other (See Comments)    Burning skin sensation  . Hydrochlorothiazide     Other reaction(s): Other Chronic renal failure  . Nsaids Other (See Comments)    Peptic ulcer disease  . Tramadol Nausea And Vomiting    Other reaction(s): GI Upset (intolerance)  . Allopurinol Other (See Comments)    Makes her stomach hurt  . Morphine Other (See Comments)    Hallucinations  . Omeprazole Diarrhea  . Pepcid [Famotidine] Other (See Comments)    Cause her to become gasy  . Trulicity [Dulaglutide] Other (See Comments)  . Ciprofloxacin     Other reaction(s): GI Upset (intolerance) Other reaction(s): Abdominal Pain Other reaction(s): Abdominal Pain  . Gabapentin Nausea Only    Feels "stupid" and wt gain   . Pantoprazole Diarrhea    ROS Review of Systems    Objective:    Physical Exam  Constitutional: She is oriented to person, place, and time. She appears well-developed and well-nourished.  HENT:  Head: Normocephalic and atraumatic.  Cardiovascular: Normal rate, regular rhythm and normal heart sounds.  Pulmonary/Chest: Effort normal and breath sounds normal.  Musculoskeletal:     Comments: Left knee with trace swelling.  Tender along the medial and  lateral joint lines.  Nontender of the patellar tendon.  Normal range of motion.  Neurological: She is alert and oriented to person, place, and time.  Skin: Skin is warm and dry.  Psychiatric: She has a normal mood and affect. Her behavior is normal.    BP (!) 116/52   Pulse 86   Ht 5' 4" (1.626 m)   Wt 158 lb (71.7 kg)   SpO2 100%   BMI 27.12 kg/m  Wt Readings from Last 3 Encounters:  02/23/19 158 lb (71.7 kg)  10/07/18 161 lb (73 kg)  06/27/18 163 lb (73.9 kg)     Health Maintenance Due  Topic Date Due  . DEXA SCAN  09/30/2018    There are no preventive care reminders to display for this patient.  Lab Results  Component Value Date   TSH 1.08 11/25/2017   Lab Results  Component Value Date   WBC 6.2 11/28/2018   HGB 9.9 (A) 11/28/2018   HCT 32 (A) 11/28/2018   MCV 75.4 (L) 05/01/2018   PLT 152 11/28/2018   Lab Results  Component Value Date   NA 136 05/01/2018   K 4.5 05/01/2018   CO2 21 05/01/2018   GLUCOSE 176 (H) 05/01/2018   BUN 19 05/01/2018   CREATININE 1.59 (H) 05/01/2018   BILITOT 0.4 05/01/2018   ALKPHOS 57 11/28/2016   AST 77 (H) 05/01/2018   ALT 62 (H) 05/01/2018   PROT 7.1 05/01/2018   ALBUMIN 4.3 11/28/2016   CALCIUM 9.8 05/01/2018   Lab Results  Component Value Date   CHOL 183 06/27/2018   Lab Results  Component Value Date   HDL 53 06/27/2018   Lab Results  Component Value Date  [place] 97 06/27/2018   Lab Results  Component Value Date   TRIG 209 (H) 06/27/2018   Lab Results  Component Value Date   CHOLHDL 3.5 06/27/2018   Lab Results  Component Value Date   HGBA1C 6.3 (A) 02/23/2019      Assessment & Plan:   Problem List Items Addressed This Visit      Cardiovascular and Mediastinum   HTN (hypertension) - Primary    Well controlled. Continue current regimen. Follow up in  6 mo      Relevant Orders   TSH   COMPLETE METABOLIC PANEL WITH GFR   Lipid panel   CBC with Differential   Fe+TIBC+Fer     Endocrine    Type 2 diabetes mellitus without complications (HCC)    F0O looks phenomenal today at 6.3 but she has been having frequent lows.  We will decrease morning insulin down to 32 units in evening insulin down to 30 units.  That is a total of 4 units.  Kercher to do that for 2 weeks and if after that she still having lows then decrease down by 2 units in the morning and 2 units in the evening.  Did encourage her to try to eat more consistently and try to have at least 3 small meals/snacks per day.  Encouraged her to increase her protein intake.      Relevant Orders   POCT glycosylated hemoglobin (Hb A1C) (Completed)   TSH   COMPLETE METABOLIC PANEL WITH GFR   Lipid panel   CBC with Differential   Fe+TIBC+Fer   Hypothyroidism    Due to recheck thyroid level.      Relevant Orders   TSH   COMPLETE METABOLIC PANEL WITH GFR   Lipid panel   CBC with Differential   Fe+TIBC+Fer     Other   Iron deficiency anemia   Relevant Medications   Ferrous Sulfate (IRON PO)   Other Relevant Orders   TSH   COMPLETE METABOLIC PANEL WITH GFR   Lipid panel   CBC with Differential   Fe+TIBC+Fer   Gout    Getting infusions every 2 weeks.  Followed by rheumatology.  Renal function has been stable.  I have colchicine twice a day on her med list so I had like to see if we can start scaling that back to either daily or as needed.      Relevant Medications   colchicine 0.6 MG tablet    Other Visit Diagnoses    Vaginal burning       Relevant Orders   WET PREP FOR TRICH, YEAST, CLUE   POCT urinalysis dipstick (Completed)   Pain and swelling of left knee         Vaginal burning-wet prep performed today just to rule out yeast or bacterial vaginitis.  But likely vaginal dryness.  We discussed not continuing the oral estrogen but using a estradiol cream twice a week.  Left knee pain-sounds most consistent with osteoarthritis.  If it starts to swell or become painful again then recommend that we get her in with  one of our sports med docs for further work-up.  Okay to use Tylenol as needed.  Also discussed need for shingles vaccine.  She will try to get this done at the pharmacy.  Meds ordered this encounter  Medications  . Zoster Vaccine Adjuvanted [facility]) injection    Sig: Inject 0.5 mLs into the muscle once for 1 dose.    Dispense:  0.5 mL  Refill:  0  . estradiol (ESTRACE) 0.1 MG/GM vaginal cream    Sig: Place 1 Applicatorful vaginally 2 (two) times a week.    Dispense:  42.5 g    Refill:  3  . colchicine 0.6 MG tablet    Sig: Take 1 tablet (0.6 mg total) by mouth daily as needed.    Dispense:  1 tablet    Refill:  0    Follow-up: Return in about 3 months (around 05/26/2019) for Diabetes follow-up.    Beatrice Lecher, MD

## 2019-02-23 NOTE — Assessment & Plan Note (Signed)
Getting infusions every 2 weeks.  Followed by rheumatology.  Renal function has been stable.  I have colchicine twice a day on her med list so I had like to see if we can start scaling that back to either daily or as needed.

## 2019-02-23 NOTE — Assessment & Plan Note (Signed)
A1c looks phenomenal today at 6.3 but she has been having frequent lows.  We will decrease morning insulin down to 32 units in evening insulin down to 30 units.  That is a total of 4 units.  Kercher to do that for 2 weeks and if after that she still having lows then decrease down by 2 units in the morning and 2 units in the evening.  Did encourage her to try to eat more consistently and try to have at least 3 small meals/snacks per day.  Encouraged her to increase her protein intake.

## 2019-02-23 NOTE — Assessment & Plan Note (Signed)
Well controlled. Continue current regimen. Follow up in  6 mo  

## 2019-02-23 NOTE — Telephone Encounter (Signed)
[facility] pharmacy called to see if patient can get the shingles vaccine. She is due for IV Krystexxa tomorrow. They want to know if this is ok.

## 2019-02-23 NOTE — Patient Instructions (Addendum)
Decrease your insulin down to 32 units in the morning and 30 units in the evening for 2 weeks.  Make sure you are eating at least 3 times a day it can be a very small amount.  Preferably something with protein if possible.  After 2 weeks if you are still having some lows then decrease your morning dose to 30 units and your evening dose to 28 units.   Encourage you to start exercising regularly.  You can do chair exercises since your knee has been bothering you.  Try to keep your heart rate up and work on some strengthening.

## 2019-02-24 LAB — CBC WITH DIFFERENTIAL/PLATELET
Absolute Monocytes: 681 cells/uL (ref 200–950)
Basophils Absolute: 74 cells/uL (ref 0–200)
Basophils Relative: 0.8 %
Eosinophils Absolute: 248 cells/uL (ref 15–500)
Eosinophils Relative: 2.7 %
HCT: 33.6 % — ABNORMAL LOW (ref 35.0–45.0)
Hemoglobin: 10.5 g/dL — ABNORMAL LOW (ref 11.7–15.5)
Lymphs Abs: 4416 cells/uL — ABNORMAL HIGH (ref 850–3900)
MCH: 24.8 pg — ABNORMAL LOW (ref 27.0–33.0)
MCHC: 31.3 g/dL — ABNORMAL LOW (ref 32.0–36.0)
MCV: 79.4 fL — ABNORMAL LOW (ref 80.0–100.0)
Monocytes Relative: 7.4 %
Neutro Abs: 3781 cells/uL (ref 1500–7800)
Neutrophils Relative %: 41.1 %
Platelets: 163 10*3/uL (ref 140–400)
RBC: 4.23 10*6/uL (ref 3.80–5.10)
RDW: 13.8 % (ref 11.0–15.0)
Total Lymphocyte: 48 %
WBC: 9.2 10*3/uL (ref 3.8–10.8)

## 2019-02-24 LAB — WET PREP FOR TRICH, YEAST, CLUE
MICRO NUMBER:: 1054639
Specimen Quality: ADEQUATE

## 2019-02-24 LAB — COMPLETE METABOLIC PANEL WITH GFR
AG Ratio: 1.8 (calc) (ref 1.0–2.5)
ALT: 46 U/L — ABNORMAL HIGH (ref 6–29)
AST: 48 U/L — ABNORMAL HIGH (ref 10–35)
Albumin: 4.4 g/dL (ref 3.6–5.1)
Alkaline phosphatase (APISO): 46 U/L (ref 37–153)
BUN/Creatinine Ratio: 17 (calc) (ref 6–22)
BUN: 32 mg/dL — ABNORMAL HIGH (ref 7–25)
CO2: 21 mmol/L (ref 20–32)
Calcium: 9.4 mg/dL (ref 8.6–10.4)
Chloride: 107 mmol/L (ref 98–110)
Creat: 1.83 mg/dL — ABNORMAL HIGH (ref 0.60–0.93)
GFR, Est African American: 31 mL/min/{1.73_m2} — ABNORMAL LOW (ref >=60)
GFR, Est Non African American: 27 mL/min/{1.73_m2} — ABNORMAL LOW (ref >=60)
Globulin: 2.5 g/dL (calc) (ref 1.9–3.7)
Glucose, Bld: 152 mg/dL — ABNORMAL HIGH (ref 65–99)
Potassium: 4.4 mmol/L (ref 3.5–5.3)
Sodium: 138 mmol/L (ref 135–146)
Total Bilirubin: 0.3 mg/dL (ref 0.2–1.2)
Total Protein: 6.9 g/dL (ref 6.1–8.1)

## 2019-02-24 LAB — LIPID PANEL
Cholesterol: 154 mg/dL (ref <200)
HDL: 46 mg/dL — ABNORMAL LOW (ref >=50)
LDL Cholesterol (Calc): 72 mg/dL (calc)
Non-HDL Cholesterol (Calc): 108 mg/dL (calc) (ref <130)
Total CHOL/HDL Ratio: 3.3 (calc) (ref <5.0)
Triglycerides: 301 mg/dL — ABNORMAL HIGH (ref <150)

## 2019-02-24 LAB — IRON,TIBC AND FERRITIN PANEL
%SAT: 36 % (calc) (ref 16–45)
Ferritin: 80 ng/mL (ref 16–288)
Iron: 117 ug/dL (ref 45–160)
TIBC: 329 mcg/dL (calc) (ref 250–450)

## 2019-02-24 LAB — TSH: TSH: 1.7 mIU/L (ref 0.40–4.50)

## 2019-02-25 NOTE — Telephone Encounter (Signed)
Spoke w/pt and advised her of Dr. Gardiner Ramus recommendations.Marland KitchenMarland KitchenElouise Munroe, [place]netta [place]

## 2019-02-25 NOTE — Telephone Encounter (Signed)
Good question.  I did not see any contraindication between the vaccine and the Krystexxa.  I would recommend that she go sometime next week that way at least a week out from the infusion to minimize any potential interaction but there really should not be.  The new shingles vaccine is not a live vaccine.  Okay to call [facility] and give them the okay to give it next week.  Also let patient know.

## 2019-02-25 NOTE — Telephone Encounter (Signed)
Spoke w/Jennifer and advised her of recommendations for Shingrix vaccine.Marland KitchenMarland KitchenMarland KitchenElouise Munroe, [place]

## 2019-02-26 LAB — HM DIABETES EYE EXAM

## 2019-03-05 ENCOUNTER — Encounter: Payer: Self-pay | Admitting: Family Medicine

## 2019-03-17 ENCOUNTER — Encounter: Payer: Self-pay | Admitting: Family Medicine

## 2019-05-22 ENCOUNTER — Ambulatory Visit: Payer: Medicare Other

## 2019-05-26 ENCOUNTER — Other Ambulatory Visit: Payer: Self-pay

## 2019-05-26 ENCOUNTER — Ambulatory Visit (INDEPENDENT_AMBULATORY_CARE_PROVIDER_SITE_OTHER): Payer: Medicare Other | Admitting: Family Medicine

## 2019-05-26 ENCOUNTER — Encounter: Payer: Self-pay | Admitting: Family Medicine

## 2019-05-26 VITALS — BP 124/65 | HR 96 | Ht 64.0 in | Wt 165.0 lb

## 2019-05-26 DIAGNOSIS — N184 Chronic kidney disease, stage 4 (severe): Secondary | ICD-10-CM

## 2019-05-26 DIAGNOSIS — E119 Type 2 diabetes mellitus without complications: Secondary | ICD-10-CM | POA: Diagnosis not present

## 2019-05-26 DIAGNOSIS — I10 Essential (primary) hypertension: Secondary | ICD-10-CM

## 2019-05-26 DIAGNOSIS — M1A0790 Idiopathic chronic gout, unspecified ankle and foot, without tophus (tophi): Secondary | ICD-10-CM

## 2019-05-26 DIAGNOSIS — E875 Hyperkalemia: Secondary | ICD-10-CM

## 2019-05-26 DIAGNOSIS — Z794 Long term (current) use of insulin: Secondary | ICD-10-CM | POA: Diagnosis not present

## 2019-05-26 DIAGNOSIS — R252 Cramp and spasm: Secondary | ICD-10-CM

## 2019-05-26 LAB — POCT GLYCOSYLATED HEMOGLOBIN (HGB A1C): Hemoglobin A1C: 7.3 % — AB (ref 4.0–5.6)

## 2019-05-26 MED ORDER — GLIPIZIDE 5 MG PO TABS: 5.0000 mg | ORAL_TABLET | Freq: Two times a day (BID) | ORAL | 1 refills | Status: DC

## 2019-05-26 NOTE — Assessment & Plan Note (Addendum)
She has been taking Krystexxa per Dr. Jerene Pitch her Rheumatologist.  She has finished all her infusions but does feel like her blood sugars have been running much higher since the infusions.  Hopefully this will be transient.

## 2019-05-26 NOTE — Progress Notes (Signed)
Established Patient Office Visit  Subjective:  Patient ID: Barbara Yu, female    DOB: May 03, 1945  Age: 74 y.o. MRN: [id number]  CC:  Chief Complaint  Patient presents with  . Diabetes  . Hypertension    HPI Barbara Yu presents for   Diabetes - no hypoglycemic events. No wounds or sores that are not healing well. No increased thirst or urination. Checking glucose at home. Taking medications as prescribed without any side effects. She often times skips lunch but does consistently eat breakfast and often eats dinner.  Breakfast is usually around 11:30 in the morning and dinner is usually around 7 to 8:00 in the evening.  She did bring in her glucose log today.  Hypertension- Pt denies chest pain, SOB, dizziness, or heart palpitations.  Taking meds as directed w/o problems.  Denies medication side effects.   CKD 4-in regards to renal function she did see nephrology back in November and somehow had been inadvertently taking lisinopril and valsartan and her Exforge.  The lisinopril was discontinued and repeat creatinine in November looked much better.  Hopefully that is where her baseline will remain.  And it look like her hyperkalemia had resolved as well.  Also gets cramping in her hands and feet from time to time.  She has been taking vinegar capsules to try to help with it.  She also brought in a form for diabetic shoes to be completed..   Past Medical History:  Diagnosis Date  . Anemia   . Diabetes mellitus without complication ([place])   . Hypertension   . Normal cardiac stress test 06/09/2012   at [facility] normal cardiac stress test (surgery preop); Dr Norman Clay  . Vitamin D deficiency     Past Surgical History:  Procedure Laterality Date  . BACK SURGERY      History reviewed. No pertinent family history.  Social History   Socioeconomic History  . Marital status: Widowed    Spouse name: Not on file  . Number of children: Not on file  . Years of education: Not on  file  . Highest education level: Not on file  Occupational History  . Occupation: retired  Tobacco Use  . Smoking status: Former Research scientist (life sciences)  . Smokeless tobacco: Never Used  Substance and Sexual Activity  . Alcohol use: No  . Drug use: No  . Sexual activity: Never  Other Topics Concern  . Not on file  Social History Narrative  . Not on file   Social Determinants of Health   Financial Resource Strain:   . Difficulty of Paying Living Expenses: Not on file  Food Insecurity:   . Worried About Charity fundraiser in the Last Year: Not on file  . Ran Out of Food in the Last Year: Not on file  Transportation Needs:   . Lack of Transportation (Medical): Not on file  . Lack of Transportation (Non-Medical): Not on file  Physical Activity:   . Days of Exercise per Week: Not on file  . Minutes of Exercise per Session: Not on file  Stress:   . Feeling of Stress : Not on file  Social Connections:   . Frequency of Communication with Friends and Family: Not on file  . Frequency of Social Gatherings with Friends and Family: Not on file  . Attends Religious Services: Not on file  . Active Member of Clubs or Organizations: Not on file  . Attends Archivist Meetings: Not on file  . Marital Status: Not on  file  Intimate Partner Violence:   . Fear of Current or Ex-Partner: Not on file  . Emotionally Abused: Not on file  . Physically Abused: Not on file  . Sexually Abused: Not on file    Outpatient Medications Prior to Visit  Medication Sig Dispense Refill  . acetaminophen (TYLENOL) 325 MG tablet Take 650 mg by mouth as needed.    Marland Kitchen albuterol (VENTOLIN HFA) 108 (90 Base) MCG/ACT inhaler Inhale 2 puffs into the lungs every 6 (six) hours as needed. 54 g 4  . amLODipine-valsartan (EXFORGE) 5-320 MG tablet Take 1 tablet by mouth daily. 90 tablet 3  . colchicine 0.6 MG tablet Take 1 tablet (0.6 mg total) by mouth daily as needed. 1 tablet 0  . Darifenacin Hydrobromide (ENABLEX PO) Take  by mouth.    . diazepam (VALIUM) 10 MG tablet Take 10 mg by mouth as needed.    Marland Kitchen estradiol (ESTRACE) 0.1 MG/GM vaginal cream Place 1 Applicatorful vaginally 2 (two) times a week. 42.5 g 3  . estrogen, conjugated,-medroxyprogesterone (PREMPRO) 0.625-5 MG per tablet Take 1 tablet by mouth daily.      . Ferrous Sulfate (IRON PO) Take by mouth.    Marland Kitchen glucose blood (FREESTYLE LITE) test strip For testing 3 times daily. DX: E11.9 300 each 4  . Insulin Isophane & Regular Human (HUMULIN 70/30 KWIKPEN) (70-30) 100 UNIT/ML PEN 34 units in AM and 32 units in evening. 45 mL 4  . Insulin Pen Needle (PEN NEEDLES 31GX5/16") 31G X 8 MM MISC Use with insulin three (3) times daily  DX E11.9 and Z79.4 300 each 4  . lactulose (CHRONULAC) 10 GM/15ML solution Take 15-30 mLs (10-20 g total) by mouth 2 (two) times daily as needed for mild constipation. 1892 mL 3  . levothyroxine (SYNTHROID) 75 MCG tablet Take 1 tablet (75 mcg total) by mouth daily. 90 tablet 3  . levothyroxine (SYNTHROID, LEVOTHROID) 50 MCG tablet TAKE 1 TABLET TWO TIMES A WEEK. ON SUNDAY AND WEDNESDAY. ALTERNATING WITH 75 MCG THE OTHER 5 DAYS 90 tablet 4  . liraglutide (VICTOZA) 18 MG/3ML SOPN Inject 0.2 mLs (1.2 mg total) into the skin daily. 18 mL 3  . Magnesium 250 MG TABS Take by mouth.    . montelukast (SINGULAIR) 10 MG tablet Take 1 tablet (10 mg total) by mouth at bedtime. 90 tablet 3  . NARCAN 4 MG/0.1ML LIQD nasal spray kit CALL 911. ADMINISTER A SINGLE SPRAY OF NARCAN IN ONE NOSTRIL. REPEAT EVERY 3 MINUTES AS NEEDED IF NO OR MINIMAL RESPONSE.  0  . Oxycodone HCl 10 MG TABS     . polyethylene glycol (MIRALAX / GLYCOLAX) packet Take 17 g by mouth as needed.    . sitaGLIPtin (JANUVIA) 25 MG tablet Take 1 tablet (25 mg total) by mouth daily. 90 tablet 3  . Vitamin D, Ergocalciferol, (DRISDOL) 1.25 MG (50000 UT) CAPS capsule TAKE 1 CAPSULE ONCE A WEEK 30 capsule 4  . glimepiride (AMARYL) 4 MG tablet TAKE 1 TABLET TWICE A DAY 180 tablet 4  .  OXYCONTIN 20 MG 12 hr tablet     . Pegloticase (KRYSTEXXA IV) Inject into the vein every 14 (fourteen) days.    Marland Kitchen terconazole (TERAZOL 7) 0.4 % vaginal cream INSERT 1 APPLICATORFUL VAGINALLY AT BEDTIME     No facility-administered medications prior to visit.    Allergies  Allergen Reactions  . Amoxicillin Diarrhea and Itching    Other reaction(s): Confusion (intolerance)  . Atorvastatin Other (See Comments)  Burning skin sensation  . Hydrochlorothiazide     Other reaction(s): Other Chronic renal failure  . Nsaids Other (See Comments)    Peptic ulcer disease  . Tramadol Nausea And Vomiting    Other reaction(s): GI Upset (intolerance)  . Allopurinol Other (See Comments)    Makes her stomach hurt  . Morphine Other (See Comments)    Hallucinations  . Omeprazole Diarrhea  . Pepcid [Famotidine] Other (See Comments)    Cause her to become gasy  . Trulicity [Dulaglutide] Other (See Comments)  . Ciprofloxacin     Other reaction(s): GI Upset (intolerance) Other reaction(s): Abdominal Pain Other reaction(s): Abdominal Pain  . Gabapentin Nausea Only    Feels "stupid" and wt gain   . Pantoprazole Diarrhea    ROS Review of Systems    Objective:    Physical Exam  Constitutional: She is oriented to person, place, and time. She appears well-developed and well-nourished.  HENT:  Head: Normocephalic and atraumatic.  Eyes: Conjunctivae are normal.  Cardiovascular: Normal rate, regular rhythm and normal heart sounds.  Pulmonary/Chest: Effort normal and breath sounds normal.  Neurological: She is alert and oriented to person, place, and time.  Skin: Skin is warm and dry.  Psychiatric: She has a normal mood and affect. Her behavior is normal.    BP 124/65   Pulse 96   Ht 5' 4"  (1.626 m)   Wt 165 lb (74.8 kg)   SpO2 98%   BMI 28.32 kg/m  Wt Readings from Last 3 Encounters:  05/26/19 165 lb (74.8 kg)  02/23/19 158 lb (71.7 kg)  10/07/18 161 lb (73 kg)     Health  Maintenance Due  Topic Date Due  . DEXA SCAN  09/30/2018    There are no preventive care reminders to display for this patient.  Lab Results  Component Value Date   TSH 1.70 02/23/2019   Lab Results  Component Value Date   WBC 9.2 02/23/2019   HGB 10.5 (L) 02/23/2019   HCT 33.6 (L) 02/23/2019   MCV 79.4 (L) 02/23/2019   PLT 163 02/23/2019   Lab Results  Component Value Date   NA 138 02/23/2019   K 4.4 02/23/2019   CO2 21 02/23/2019   GLUCOSE 152 (H) 02/23/2019   BUN 32 (H) 02/23/2019   CREATININE 1.83 (H) 02/23/2019   BILITOT 0.3 02/23/2019   ALKPHOS 57 11/28/2016   AST 48 (H) 02/23/2019   ALT 46 (H) 02/23/2019   PROT 6.9 02/23/2019   ALBUMIN 4.3 11/28/2016   CALCIUM 9.4 02/23/2019   Lab Results  Component Value Date   CHOL 154 02/23/2019   Lab Results  Component Value Date   HDL 46 (L) 02/23/2019   Lab Results  Component Value Date   LDLCALC 72 02/23/2019   Lab Results  Component Value Date   TRIG 301 (H) 02/23/2019   Lab Results  Component Value Date   CHOLHDL 3.3 02/23/2019   Lab Results  Component Value Date   HGBA1C 7.3 (A) 05/26/2019      Assessment & Plan:   Problem List Items Addressed This Visit      Cardiovascular and Mediastinum   HTN (hypertension)    Well controlled. Continue current regimen. Follow up in  3-4 months.          Endocrine   Type 2 diabetes mellitus without complications (HCC) - Primary    A1c up today at 7.3.  Previous was better at 6.3 but she was having some  hypoglycemic events at the time.  When I looked over her glucose log they are really all over the place.  But it does look like in general she is trending a little higher in the evenings and her fastings look a little bit better but the blood sugars are ranging anywhere from 90 up to 300.  She did receive a steroid injection in the bottom of her foot a couple of weeks ago which she thinks definitely increased her numbers and she feels like her blood sugars have  been running much higher since she had the Krystexxa infusions for her gout. Will change her NovoLog 7030 to 30 units in the morning and 40 units at night I want her to do that for 2 weeks and then bring in her blood sugars.  I would also like to change her Amaryl to glipizide.      Relevant Medications   glipiZIDE (GLUCOTROL) 5 MG tablet   Other Relevant Orders   POCT glycosylated hemoglobin (Hb A1C) (Completed)     Genitourinary   Hyperkalemia, diminished renal excretion    Trying to eat a low potassium diet.      CKD (chronic kidney disease) stage 4, GFR 15-29 ml/min (HCC)    Due to recheck renal function and potassium now off of lisinopril just on valsartan.  She has a follow-up with nephrology next week so will likely have blood work.  If not then plan to get a BMP.        Other   Gout    She has been taking Krystexxa per Dr. Jerene Pitch her Rheumatologist.  She has finished all her infusions but does feel like her blood sugars have been running much higher since the infusions.  Hopefully this will be transient.         Other Visit Diagnoses    Cramping of hands         Cramping-due to recheck potassium levels.  She will likely have that done next week with her nephrologist.  We will keep an eye out for results.  Meds ordered this encounter  Medications  . glipiZIDE (GLUCOTROL) 5 MG tablet    Sig: Take 1 tablet (5 mg total) by mouth 2 (two) times daily before a meal.    Dispense:  180 tablet    Refill:  1    Follow-up: Return in about 3 months (around 08/23/2019) for Diabetes follow-up.    Beatrice Lecher, MD

## 2019-05-26 NOTE — Assessment & Plan Note (Signed)
Well controlled. Continue current regimen. Follow up in  3-4 months.  

## 2019-05-26 NOTE — Assessment & Plan Note (Signed)
Due to recheck renal function and potassium now off of lisinopril just on valsartan.  She has a follow-up with nephrology next week so will likely have blood work.  If not then plan to get a BMP.

## 2019-05-26 NOTE — Patient Instructions (Signed)
Humulin 70/30: Given 40 units at night before evening meal  and 30 units in the morning before breakfast.  Try this for 2 weeks and record sugars and drop off numbers to review.

## 2019-05-26 NOTE — Assessment & Plan Note (Signed)
Trying to eat a low potassium diet.

## 2019-05-26 NOTE — Assessment & Plan Note (Addendum)
A1c up today at 7.3.  Previous was better at 6.3 but she was having some hypoglycemic events at the time.  When I looked over her glucose log they are really all over the place.  But it does look like in general she is trending a little higher in the evenings and her fastings look a little bit better but the blood sugars are ranging anywhere from 90 up to 300.  She did receive a steroid injection in the bottom of her foot a couple of weeks ago which she thinks definitely increased her numbers and she feels like her blood sugars have been running much higher since she had the Krystexxa infusions for her gout. Will change her NovoLog 7030 to 30 units in the morning and 40 units at night I want her to do that for 2 weeks and then bring in her blood sugars.  I would also like to change her Amaryl to glipizide.

## 2019-05-27 ENCOUNTER — Ambulatory Visit: Payer: Medicare Other

## 2019-05-30 ENCOUNTER — Ambulatory Visit: Payer: Medicare Other

## 2019-06-10 ENCOUNTER — Encounter: Payer: Self-pay | Admitting: Family Medicine

## 2019-06-10 MED ORDER — TRIAMCINOLONE 0.1 % CREAM:EUCERIN CREAM 1:1: 1.0000 "application " | TOPICAL_CREAM | Freq: Two times a day (BID) | CUTANEOUS | 1 refills | Status: DC | PRN

## 2019-06-12 ENCOUNTER — Telehealth: Payer: Self-pay

## 2019-06-12 ENCOUNTER — Other Ambulatory Visit: Payer: Self-pay | Admitting: *Deleted

## 2019-06-12 DIAGNOSIS — E119 Type 2 diabetes mellitus without complications: Secondary | ICD-10-CM

## 2019-06-12 MED ORDER — FREESTYLE LITE TEST VI STRP: ORAL_STRIP | 4 refills | Status: DC

## 2019-06-12 NOTE — Telephone Encounter (Signed)
[facility] called about the Triamcinolone and Eucerin cream. They need to know the ratio, quantity and area on the body.

## 2019-06-12 NOTE — Telephone Encounter (Signed)
Ratio 1:1.

## 2019-06-12 NOTE — Telephone Encounter (Signed)
Called and advised Leigh at pharmacy.  Ration 1:1 Disp. 1 pound jar Apply to affected areas of body

## 2019-06-16 ENCOUNTER — Telehealth: Payer: Self-pay | Admitting: Family Medicine

## 2019-06-16 ENCOUNTER — Other Ambulatory Visit: Payer: Self-pay | Admitting: *Deleted

## 2019-06-16 MED ORDER — "PEN NEEDLES 5/16"" 31G X 8 MM MISC": 4 refills | Status: DC

## 2019-06-16 NOTE — Telephone Encounter (Signed)
Received fax from [place] and they authorized freestyle lite test strips until 04/22/2098.  - CF

## 2019-06-23 ENCOUNTER — Encounter: Payer: Self-pay | Admitting: Family Medicine

## 2019-06-26 ENCOUNTER — Other Ambulatory Visit: Payer: Self-pay

## 2019-06-26 ENCOUNTER — Encounter: Payer: Self-pay | Admitting: Family Medicine

## 2019-06-26 ENCOUNTER — Ambulatory Visit (INDEPENDENT_AMBULATORY_CARE_PROVIDER_SITE_OTHER): Payer: Medicare Other

## 2019-06-26 ENCOUNTER — Ambulatory Visit (INDEPENDENT_AMBULATORY_CARE_PROVIDER_SITE_OTHER): Payer: Medicare Other | Admitting: Family Medicine

## 2019-06-26 VITALS — BP 124/65 | HR 86 | Temp 99.1°F | Ht 64.0 in | Wt 164.0 lb

## 2019-06-26 DIAGNOSIS — R0789 Other chest pain: Secondary | ICD-10-CM | POA: Diagnosis not present

## 2019-06-26 DIAGNOSIS — I10 Essential (primary) hypertension: Secondary | ICD-10-CM | POA: Diagnosis not present

## 2019-06-26 DIAGNOSIS — E038 Other specified hypothyroidism: Secondary | ICD-10-CM | POA: Diagnosis not present

## 2019-06-26 NOTE — Assessment & Plan Note (Signed)
Pressure not well controlled.  We will add metoprolol 25 mg.

## 2019-06-26 NOTE — Progress Notes (Signed)
Established Patient Office Visit  Subjective:  Patient ID: Barbara Yu, female    DOB: 1945/06/13  Age: 74 y.o. MRN: [id number]  CC:  Chief Complaint  Patient presents with  . belching  . Chest Pain    HPI Barbara Yu presents for chest tightness.   She reports that she had an initial episode of left-sided chest pain while walking around the mall she was just leaving the store.  She did not feel anxious or stressed.  She got a squeezing sensation in the left chest that lasted about 5 minutes.  She did not feel diaphoretic or short of breath at that time and no radiation of pain.  Then on Tuesday night, 3/2, she was getting into bed when she started having similar left-sided chest pain.Barbara Yu some ASA and took some Pepcid.  That time it lasted about 2 hours.  She thought maybe it was just some bad gas that was causing discomfort.  Barbara Yu, she had had her 2nd COVID vaccine, Moderna on 3/1, the night before. Had a low grade temp the night after (24 + hours later) as well.  Now for the third time, started to feel it again today while driving here. Today it lasts about 10-15 min and radiated into her right arm.  Her BPs have been higher as well.  He denies any tachycardia.  She denies any problems swallowing.  No jaw pain.   Hx of GERD. No prior hx of CAD.  Normal stress echo in 2014 with [facility].    Past Medical History:  Diagnosis Date  . Anemia   . Diabetes mellitus without complication ([place])   . Hypertension   . Normal cardiac stress test 06/09/2012   at [facility] normal cardiac stress test (surgery preop); Dr Norman Clay  . Vitamin D deficiency     Past Surgical History:  Procedure Laterality Date  . BACK SURGERY      History reviewed. No pertinent family history.  Social History   Socioeconomic History  . Marital status: Widowed    Spouse name: Not on file  . Number of children: Not on file  . Years of education: Not on file  . Highest education level: Not on file   Occupational History  . Occupation: retired  Tobacco Use  . Smoking status: Former Research scientist (life sciences)  . Smokeless tobacco: Never Used  Substance and Sexual Activity  . Alcohol use: No  . Drug use: No  . Sexual activity: Never  Other Topics Concern  . Not on file  Social History Narrative  . Not on file   Social Determinants of Health   Financial Resource Strain:   . Difficulty of Paying Living Expenses: Not on file  Food Insecurity:   . Worried About Charity fundraiser in the Last Year: Not on file  . Ran Out of Food in the Last Year: Not on file  Transportation Needs:   . Lack of Transportation (Medical): Not on file  . Lack of Transportation (Non-Medical): Not on file  Physical Activity:   . Days of Exercise per Week: Not on file  . Minutes of Exercise per Session: Not on file  Stress:   . Feeling of Stress : Not on file  Social Connections:   . Frequency of Communication with Friends and Family: Not on file  . Frequency of Social Gatherings with Friends and Family: Not on file  . Attends Religious Services: Not on file  . Active Member of Clubs or Organizations: Not  on file  . Attends Archivist Meetings: Not on file  . Marital Status: Not on file  Intimate Partner Violence:   . Fear of Current or Ex-Partner: Not on file  . Emotionally Abused: Not on file  . Physically Abused: Not on file  . Sexually Abused: Not on file    Outpatient Medications Prior to Visit  Medication Sig Dispense Refill  . acetaminophen (TYLENOL) 325 MG tablet Take 650 mg by mouth as needed.    Marland Kitchen albuterol (VENTOLIN HFA) 108 (90 Base) MCG/ACT inhaler Inhale 2 puffs into the lungs every 6 (six) hours as needed. 54 g 4  . amLODipine-valsartan (EXFORGE) 5-320 MG tablet Take 1 tablet by mouth daily. 90 tablet 3  . colchicine 0.6 MG tablet Take 1 tablet (0.6 mg total) by mouth daily as needed. 1 tablet 0  . Darifenacin Hydrobromide (ENABLEX PO) Take by mouth.    . diazepam (VALIUM) 10 MG tablet  Take 10 mg by mouth as needed.    Marland Kitchen estradiol (ESTRACE) 0.1 MG/GM vaginal cream Place 1 Applicatorful vaginally 2 (two) times a week. 42.5 g 3  . estrogen, conjugated,-medroxyprogesterone (PREMPRO) 0.625-5 MG per tablet Take 1 tablet by mouth daily.      . Ferrous Sulfate (IRON) 325 (65 Fe) MG TABS Take 1 tablet by mouth 3 (three) times daily.    Marland Kitchen glipiZIDE (GLUCOTROL) 5 MG tablet Take 1 tablet (5 mg total) by mouth 2 (two) times daily before a meal. 180 tablet 1  . glucose blood (FREESTYLE LITE) test strip For testing up to 4 times daily. DX: E11.9 400 each 4  . Insulin Isophane & Regular Human (HUMULIN 70/30 KWIKPEN) (70-30) 100 UNIT/ML PEN 34 units in AM and 32 units in evening. 45 mL 4  . Insulin Pen Needle (PEN NEEDLES 31GX5/16") 31G X 8 MM MISC Use with insulin three (3) times daily  DX E11.9 and Z79.4 300 each 4  . lactulose (CHRONULAC) 10 GM/15ML solution Take 15-30 mLs (10-20 g total) by mouth 2 (two) times daily as needed for mild constipation. 1892 mL 3  . levothyroxine (SYNTHROID) 75 MCG tablet Take 1 tablet (75 mcg total) by mouth daily. 90 tablet 3  . levothyroxine (SYNTHROID, LEVOTHROID) 50 MCG tablet TAKE 1 TABLET TWO TIMES A WEEK. ON SUNDAY AND WEDNESDAY. ALTERNATING WITH 75 MCG THE OTHER 5 DAYS 90 tablet 4  . liraglutide (VICTOZA) 18 MG/3ML SOPN Inject 0.2 mLs (1.2 mg total) into the skin daily. 18 mL 3  . Magnesium 250 MG TABS Take by mouth.    . montelukast (SINGULAIR) 10 MG tablet Take 1 tablet (10 mg total) by mouth at bedtime. 90 tablet 3  . NARCAN 4 MG/0.1ML LIQD nasal spray kit CALL 911. ADMINISTER A SINGLE SPRAY OF NARCAN IN ONE NOSTRIL. REPEAT EVERY 3 MINUTES AS NEEDED IF NO OR MINIMAL RESPONSE.  0  . Oxycodone HCl 10 MG TABS     . polyethylene glycol (MIRALAX / GLYCOLAX) packet Take 17 g by mouth as needed.    . sitaGLIPtin (JANUVIA) 25 MG tablet Take 1 tablet (25 mg total) by mouth daily. 90 tablet 3  . Triamcinolone Acetonide (TRIAMCINOLONE 0.1 % CREAM : EUCERIN) CREA  Apply 1 application topically 2 (two) times daily as needed for itching. 1 jar 1 each 1  . Vitamin D, Ergocalciferol, (DRISDOL) 1.25 MG (50000 UT) CAPS capsule TAKE 1 CAPSULE ONCE A WEEK 30 capsule 4  . XTAMPZA ER 18 MG C12A Take 1 capsule by mouth  2 (two) times daily.     No facility-administered medications prior to visit.    Allergies  Allergen Reactions  . Amoxicillin Diarrhea and Itching    Other reaction(s): Confusion (intolerance)  . Atorvastatin Other (See Comments)    Burning skin sensation  . Hydrochlorothiazide     Other reaction(s): Other Chronic renal failure  . Nsaids Other (See Comments)    Peptic ulcer disease  . Tramadol Nausea And Vomiting    Other reaction(s): GI Upset (intolerance)  . Allopurinol Other (See Comments)    Makes her stomach hurt  . Morphine Other (See Comments)    Hallucinations  . Omeprazole Diarrhea  . Pepcid [Famotidine] Other (See Comments)    Cause her to become gasy  . Trulicity [Dulaglutide] Other (See Comments)  . Ciprofloxacin     Other reaction(s): GI Upset (intolerance) Other reaction(s): Abdominal Pain Other reaction(s): Abdominal Pain  . Gabapentin Nausea Only    Feels "stupid" and wt gain   . Pantoprazole Diarrhea    ROS Review of Systems    Objective:    Physical Exam  Constitutional: She is oriented to person, place, and time. She appears well-developed and well-nourished.  HENT:  Head: Normocephalic and atraumatic.  Right Ear: External ear normal.  Left Ear: External ear normal.  Nose: Nose normal.  Mouth/Throat: Oropharynx is clear and moist.  TMs and canals are clear.   Eyes: Pupils are equal, round, and reactive to light. Conjunctivae and EOM are normal.  Neck: No thyromegaly present.  Cardiovascular: Normal rate, regular rhythm and normal heart sounds.  Carotid bruits.  No renal artery bruits.  Pulmonary/Chest: Effort normal and breath sounds normal. She has no wheezes. She exhibits no tenderness.  No  chest wall tenderness.  Abdominal: Soft. Bowel sounds are normal. She exhibits no distension and no mass. There is no abdominal tenderness. There is no rebound and no guarding.  Musculoskeletal:     Cervical back: Neck supple.  Lymphadenopathy:    She has no cervical adenopathy.  Neurological: She is alert and oriented to person, place, and time.  Skin: Skin is warm and dry.  Psychiatric: She has a normal mood and affect. Her behavior is normal.    BP 124/65   Pulse 86   Temp 99.1 F (37.3 C)   Ht _0  (1.626 m)   Wt 164 lb (74.4 kg)   SpO2 100%   BMI 28.15 kg/m  Wt Readings from Last 3 Encounters:  06/26/19 164 lb (74.4 kg)  05/26/19 165 lb (74.8 kg)  02/23/19 158 lb (71.7 kg)     Health Maintenance Due  Topic Date Due  . DEXA SCAN  09/30/2018    There are no preventive care reminders to display for this patient.  Lab Results  Component Value Date   TSH 1.70 02/23/2019   Lab Results  Component Value Date   WBC 9.2 02/23/2019   HGB 10.5 (L) 02/23/2019   HCT 33.6 (L) 02/23/2019   MCV 79.4 (L) 02/23/2019   PLT 163 02/23/2019   Lab Results  Component Value Date   NA 138 02/23/2019   K 4.4 02/23/2019   CO2 21 02/23/2019   GLUCOSE 152 (H) 02/23/2019   BUN 32 (H) 02/23/2019   CREATININE 1.83 (H) 02/23/2019   BILITOT 0.3 02/23/2019   ALKPHOS 57 11/28/2016   AST 48 (H) 02/23/2019   ALT 46 (H) 02/23/2019   PROT 6.9 02/23/2019   ALBUMIN 4.3 11/28/2016   CALCIUM 9.4 02/23/2019  Lab Results  Component Value Date   CHOL 154 02/23/2019   Lab Results  Component Value Date   HDL 46 (L) 02/23/2019   Lab Results  Component Value Date   LDLCALC 72 02/23/2019   Lab Results  Component Value Date   TRIG 301 (H) 02/23/2019   Lab Results  Component Value Date   CHOLHDL 3.3 02/23/2019   Lab Results  Component Value Date   HGBA1C 7.3 (A) 05/26/2019      Assessment & Plan:   Problem List Items Addressed This Visit      Cardiovascular and Mediastinum    HTN (hypertension)    Pressure not well controlled.  We will add metoprolol 25 mg.      Relevant Orders   DG Chest 2 View   Ambulatory referral to Cardiology     Endocrine   Hypothyroidism   Relevant Orders   TSH    Other Visit Diagnoses    Atypical chest pain    -  Primary   Relevant Orders   EKG 12-Lead   CBC with Differential/Platelet   Troponin I   COMPLETE METABOLIC PANEL WITH GFR   TSH   DG Chest 2 View   Ambulatory referral to Cardiology      Atypical chest pain-I am concerned about cardiac cause.  She has diabetes, CKD 4 and hypertension, on estrogen therapy, and so is at high risk for cardiovascular disease as she is never had any prior diagnosis or problems.  EKG today showed rate of 95 bpm, normal sinus rhythm but she did have inverted T waves in the anterolateral leads.  Interestingly, we had an EKG from 2018 that did not show the T wave inversion but she had an old EKG on file from [facility] from 2014 that indicated that she did have T wave inversion and in the anterior lateral leads.  So is a little unclear if these changes are old or more recent is a little confusing actually.  So we can do some additional stat labs for further work-up and send her down for chest x-ray as well.  Will check potassium level as well.  She has had problems with hyperkalemia before.  She denies any tachycardia though.  Chest pain occurs again that I want her to go to the emergency department for further work-up.  Hypothyroidism-with recent onset of chest pain we will recheck TSH levels.  Hypertension-initial blood pressure was elevated but repeat look fantastic.  No orders of the defined types were placed in this encounter.   Follow-up: No follow-ups on file.    Beatrice Lecher, MD

## 2019-06-29 ENCOUNTER — Telehealth: Payer: Self-pay | Admitting: *Deleted

## 2019-06-29 DIAGNOSIS — R350 Frequency of micturition: Secondary | ICD-10-CM

## 2019-06-29 LAB — COMPLETE METABOLIC PANEL WITH GFR
AG Ratio: 1.6 (calc) (ref 1.0–2.5)
ALT: 59 U/L — ABNORMAL HIGH (ref 6–29)
AST: 80 U/L — ABNORMAL HIGH (ref 10–35)
Albumin: 4.7 g/dL (ref 3.6–5.1)
Alkaline phosphatase (APISO): 47 U/L (ref 37–153)
BUN/Creatinine Ratio: 18 (calc) (ref 6–22)
BUN: 33 mg/dL — ABNORMAL HIGH (ref 7–25)
CO2: 22 mmol/L (ref 20–32)
Calcium: 10.4 mg/dL (ref 8.6–10.4)
Chloride: 103 mmol/L (ref 98–110)
Creat: 1.87 mg/dL — ABNORMAL HIGH (ref 0.60–0.93)
GFR, Est African American: 30 mL/min/{1.73_m2} — ABNORMAL LOW (ref >=60)
GFR, Est Non African American: 26 mL/min/{1.73_m2} — ABNORMAL LOW (ref >=60)
Globulin: 2.9 g/dL (calc) (ref 1.9–3.7)
Glucose, Bld: 275 mg/dL — ABNORMAL HIGH (ref 65–99)
Potassium: 4.8 mmol/L (ref 3.5–5.3)
Sodium: 136 mmol/L (ref 135–146)
Total Bilirubin: 0.4 mg/dL (ref 0.2–1.2)
Total Protein: 7.6 g/dL (ref 6.1–8.1)

## 2019-06-29 LAB — CBC WITH DIFFERENTIAL/PLATELET
Absolute Monocytes: 637 cells/uL (ref 200–950)
Basophils Absolute: 59 cells/uL (ref 0–200)
Basophils Relative: 0.9 %
Eosinophils Absolute: 598 cells/uL — ABNORMAL HIGH (ref 15–500)
Eosinophils Relative: 9.2 %
HCT: 34.5 % — ABNORMAL LOW (ref 35.0–45.0)
Hemoglobin: 10.8 g/dL — ABNORMAL LOW (ref 11.7–15.5)
Lymphs Abs: 2496 cells/uL (ref 850–3900)
MCH: 24.5 pg — ABNORMAL LOW (ref 27.0–33.0)
MCHC: 31.3 g/dL — ABNORMAL LOW (ref 32.0–36.0)
MCV: 78.4 fL — ABNORMAL LOW (ref 80.0–100.0)
Monocytes Relative: 9.8 %
Neutro Abs: 2711 cells/uL (ref 1500–7800)
Neutrophils Relative %: 41.7 %
Platelets: 103 10*3/uL — ABNORMAL LOW (ref 140–400)
RBC: 4.4 10*6/uL (ref 3.80–5.10)
RDW: 13.6 % (ref 11.0–15.0)
Total Lymphocyte: 38.4 %
WBC: 6.5 10*3/uL (ref 3.8–10.8)

## 2019-06-29 LAB — EXTRA SPECIMEN

## 2019-06-29 LAB — TSH: TSH: 0.98 mIU/L (ref 0.40–4.50)

## 2019-06-29 LAB — TROPONIN I

## 2019-06-29 MED ORDER — TOLTERODINE TARTRATE ER 4 MG PO CP24: 4.0000 mg | ORAL_CAPSULE | Freq: Every day | ORAL | 4 refills | Status: DC

## 2019-06-29 NOTE — Telephone Encounter (Signed)
Medication refill

## 2019-07-14 ENCOUNTER — Other Ambulatory Visit: Payer: Self-pay

## 2019-07-14 ENCOUNTER — Encounter: Payer: Self-pay | Admitting: Cardiology

## 2019-07-14 ENCOUNTER — Ambulatory Visit (INDEPENDENT_AMBULATORY_CARE_PROVIDER_SITE_OTHER): Payer: Medicare Other | Admitting: Cardiology

## 2019-07-14 ENCOUNTER — Ambulatory Visit (HOSPITAL_COMMUNITY)
Admission: RE | Admit: 2019-07-14 | Discharge: 2019-07-14 | Disposition: A | Discharge status: Home / Self Care | Payer: Medicare Other | Source: Ambulatory Visit | Attending: Cardiology | Admitting: Cardiology

## 2019-07-14 VITALS — BP 122/56 | HR 90 | Ht 64.0 in | Wt 168.8 lb

## 2019-07-14 DIAGNOSIS — I119 Hypertensive heart disease without heart failure: Secondary | ICD-10-CM | POA: Diagnosis not present

## 2019-07-14 DIAGNOSIS — I10 Essential (primary) hypertension: Secondary | ICD-10-CM

## 2019-07-14 DIAGNOSIS — E119 Type 2 diabetes mellitus without complications: Secondary | ICD-10-CM | POA: Insufficient documentation

## 2019-07-14 DIAGNOSIS — E785 Hyperlipidemia, unspecified: Secondary | ICD-10-CM

## 2019-07-14 DIAGNOSIS — D750 Familial erythrocytosis: Secondary | ICD-10-CM | POA: Diagnosis not present

## 2019-07-14 DIAGNOSIS — R0789 Other chest pain: Secondary | ICD-10-CM

## 2019-07-14 DIAGNOSIS — I358 Other nonrheumatic aortic valve disorders: Secondary | ICD-10-CM | POA: Diagnosis not present

## 2019-07-14 DIAGNOSIS — Z794 Long term (current) use of insulin: Secondary | ICD-10-CM | POA: Diagnosis not present

## 2019-07-14 DIAGNOSIS — R072 Precordial pain: Secondary | ICD-10-CM

## 2019-07-14 DIAGNOSIS — R9431 Abnormal electrocardiogram [ECG] [EKG]: Secondary | ICD-10-CM

## 2019-07-14 HISTORY — DX: Abnormal electrocardiogram (ECG) (EKG): R94.31

## 2019-07-14 HISTORY — DX: Hyperlipidemia, unspecified: E78.5

## 2019-07-14 HISTORY — DX: Precordial pain: R07.2

## 2019-07-14 MED ORDER — METOPROLOL TARTRATE 25 MG PO TABS: 25.0000 mg | ORAL_TABLET | Freq: Two times a day (BID) | ORAL | 3 refills | Status: DC

## 2019-07-14 MED ORDER — ASPIRIN EC 81 MG PO TBEC: 81.0000 mg | DELAYED_RELEASE_TABLET | Freq: Every day | ORAL | 3 refills | Status: DC

## 2019-07-14 MED ORDER — NITROGLYCERIN 0.4 MG SL SUBL: 0.4000 mg | SUBLINGUAL_TABLET | SUBLINGUAL | 3 refills | Status: DC | PRN

## 2019-07-14 NOTE — Progress Notes (Signed)
  Echocardiogram 2D Echocardiogram has been performed.  Barbara Yu 07/14/2019, 3:59 PM

## 2019-07-14 NOTE — Progress Notes (Signed)
Cardiology Consultation:    Date:  07/14/2019   ID:  Burkley Dech, DOB 14-Jul-1945, MRN [id number]  PCP:  [name], MD  Cardiologist:  [name], MD   Referring MD: [name], *   No chief complaint on file. I have chest pain  History of Present Illness:    Barbara Yu is a 74 y.o. female who is being seen today for the evaluation of chest pain at the request of [name], *.  Past medical history significant for longstanding diabetes, hypertension, dyslipidemia.  About a month ago she was walking to the car going to her doctor and she developed squeezing tightness-like chest pain.  Entire sensation lasted for about 10 minutes and she sat in the car and waited until the past.  There was no shortness of breath there was no sweating associated with this sensation but it was quite severe.  Then she went to the doctor and after that she left and came home and had another episode slightly lighter lasting similar time.  Then for few days she did not have any issues and at the beginning of March she got 2 episodes of chest pain again much lighter than previously.  First episode happened when she lay down at night in the bed then she started having this sensation at this time lasted for about 15 to 20 minutes.  And eventually went away by itself.  And then few days later she had another episode last episode was more than 2 weeks ago.  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  Since that time she is doing well.  She is not too active because of chronic back problem.  She does have diabetes which later became somewhat difficult to control.  She does have intolerance to multiple statins..  She does have also significant kidney dysfunction with a GFR in the neighborhood of 25-32. She used to smoke but  quit in 1981 Does not have any family history of premature coronary artery disease  Past Medical History:  Diagnosis Date  . Anemia   . Diabetes mellitus without complication ([place])   . Hypertension   . Normal cardiac stress test 06/09/2012   at [facility] normal cardiac stress test (surgery preop); Dr Norman Clay  . Vitamin D deficiency     Past Surgical History:  Procedure Laterality Date  . BACK SURGERY      Current Medications: Current Meds  Medication Sig  . acetaminophen (TYLENOL) 325 MG tablet Take 650 mg by mouth as needed.  Marland Kitchen albuterol (VENTOLIN HFA) 108 (90 Base) MCG/ACT inhaler Inhale 2 puffs into the lungs every 6 (six) hours as needed.  . AMBULATORY NON FORMULARY MEDICATION   . amLODipine-valsartan (EXFORGE) 5-320 MG tablet Take 1 tablet by mouth daily.  . colchicine 0.6 MG tablet Take 1 tablet (0.6 mg total) by mouth daily as needed.  . diazepam (VALIUM) 10 MG tablet Take 10 mg by mouth as needed.  Marland Kitchen estradiol (ESTRACE) 0.1 MG/GM vaginal cream Place 1 Applicatorful vaginally 2 (two) times a week.  . estrogen, conjugated,-medroxyprogesterone (PREMPRO) 0.625-5 MG per tablet Take 1 tablet by mouth daily.    . Ferrous Sulfate (IRON) 325 (65 Fe) MG TABS Take 1 tablet by mouth 3 (three) times daily.  Marland Kitchen glimepiride (AMARYL) 4 MG tablet Take 4 mg by mouth 2 (two) times daily.  Marland Kitchen  glucose blood (FREESTYLE LITE) test strip For testing up to 4 times daily. DX: E11.9  . Insulin Isophane & Regular Human (HUMULIN 70/30 KWIKPEN) (70-30) 100 UNIT/ML PEN 34 units in AM and 32 units in evening.  . Insulin Pen Needle (PEN NEEDLES 31GX5/16") 31G X 8 MM MISC Use with insulin three (3) times daily  DX E11.9 and Z79.4  . lactulose (CHRONULAC) 10 GM/15ML solution Take 15-30 mLs (10-20 g total) by mouth 2 (two) times daily as needed for mild constipation.  Marland Kitchen levothyroxine (SYNTHROID) 75 MCG tablet Take 1 tablet (75 mcg total) by mouth daily.  Marland Kitchen levothyroxine (SYNTHROID, LEVOTHROID) 50 MCG tablet TAKE 1  TABLET TWO TIMES A WEEK. ON SUNDAY AND WEDNESDAY. ALTERNATING WITH 75 MCG THE OTHER 5 DAYS  . liraglutide (VICTOZA) 18 MG/3ML SOPN Inject 0.2 mLs (1.2 mg total) into the skin daily.  . Magnesium 250 MG TABS Take by mouth.  . montelukast (SINGULAIR) 10 MG tablet Take 1 tablet (10 mg total) by mouth at bedtime.  Marland Kitchen NARCAN 4 MG/0.1ML LIQD nasal spray kit CALL 911. ADMINISTER A SINGLE SPRAY OF NARCAN IN ONE NOSTRIL. REPEAT EVERY 3 MINUTES AS NEEDED IF NO OR MINIMAL RESPONSE.  Marland Kitchen Oxycodone HCl 10 MG TABS   . polyethylene glycol (MIRALAX / GLYCOLAX) packet Take 17 g by mouth as needed.  . sitaGLIPtin (JANUVIA) 25 MG tablet Take 1 tablet (25 mg total) by mouth daily.  Marland Kitchen tolterodine (DETROL LA) 4 MG 24 hr capsule Take 1 capsule (4 mg total) by mouth daily.  . Triamcinolone Acetonide (TRIAMCINOLONE 0.1 % CREAM : EUCERIN) CREA Apply 1 application topically 2 (two) times daily as needed for itching. 1 jar  . Vitamin D, Ergocalciferol, (DRISDOL) 1.25 MG (50000 UT) CAPS capsule TAKE 1 CAPSULE ONCE A WEEK  . XTAMPZA ER 18 MG C12A Take 1 capsule by mouth 2 (two) times daily.     Allergies:   Amoxicillin, Atorvastatin, Hydrochlorothiazide, Nsaids, Tramadol, Allopurinol, Morphine, Omeprazole, Pepcid [famotidine], Trulicity [dulaglutide], Ciprofloxacin, Gabapentin, and Pantoprazole   Social History   Socioeconomic History  . Marital status: Widowed    Spouse name: Not on file  . Number of children: Not on file  . Years of education: Not on file  . Highest education level: Not on file  Occupational History  . Occupation: retired  Tobacco Use  . Smoking status: Former Research scientist (life sciences)  . Smokeless tobacco: Never Used  Substance and Sexual Activity  . Alcohol use: No  . Drug use: No  . Sexual activity: Never  Other Topics Concern  . Not on file  Social History Narrative  . Not on file   Social Determinants of Health   Financial Resource Strain:   . Difficulty of Paying Living Expenses:   Food Insecurity:     . Worried About Charity fundraiser in the Last Year:   . Arboriculturist in the Last Year:   Transportation Needs:   . Film/video editor (Medical):   Marland Kitchen Lack of Transportation (Non-Medical):   Physical Activity:   . Days of Exercise per Week:   . Minutes of Exercise per Session:   Stress:   . Feeling of Stress :   Social Connections:   . Frequency of Communication with Friends and Family:   . Frequency of Social Gatherings with Friends and Family:   . Attends Religious Services:   . Active Member of Clubs or Organizations:   . Attends Archivist Meetings:   Marland Kitchen Marital Status:  Family History: The patient's family history is not on file. ROS:   Please see the history of present illness.    All 14 point review of systems negative except as described per history of present illness.  EKGs/Labs/Other Studies Reviewed:    The following studies were reviewed today: Normal sinus rhythm, normal P interval, dramatic symmetrical T inversion in anterior precordium  EKG:  EKG is  ordered today.  The ekg ordered today demonstrates normal sinus rhythm, normal P interval, nonspecific T wave changes with symmetrical inverted T wave in lead V2.  Recent Labs: 06/26/2019: ALT 59; BUN 33; Creat 1.87; Hemoglobin 10.8; Platelets 103; Potassium 4.8; Sodium 136; TSH 0.98  Recent Lipid Panel    Component Value Date/Time   CHOL 154 02/23/2019 1356   TRIG 301 (H) 02/23/2019 1356   HDL 46 (L) 02/23/2019 1356   CHOLHDL 3.3 02/23/2019 1356   LDLCALC 72 02/23/2019 1356    Physical Exam:    VS:  BP (!) 122/56   Pulse 90   Ht 5' 4" (1.626 m)   Wt 168 lb 12.8 oz (76.6 kg)   SpO2 95%   BMI 28.97 kg/m     Wt Readings from Last 3 Encounters:  07/14/19 168 lb 12.8 oz (76.6 kg)  06/26/19 164 lb (74.4 kg)  05/26/19 165 lb (74.8 kg)     GEN:  Well nourished, well developed in no acute distress HEENT: Normal NECK: No JVD; No carotid bruits LYMPHATICS: No  lymphadenopathy CARDIAC: RRR, no murmurs, no rubs, no gallops RESPIRATORY:  Clear to auscultation without rales, wheezing or rhonchi  ABDOMEN: Soft, non-tender, non-distended MUSCULOSKELETAL:  No edema; No deformity  SKIN: Warm and dry NEUROLOGIC:  Alert and oriented x 3 PSYCHIATRIC:  Normal affect   ASSESSMENT:    1. Atypical chest pain   2. Essential hypererythropoietinemia   3. Essential hypertension   4. Precordial chest pain   5. Nonspecific abnormal electrocardiogram (ECG) (EKG)   6. Type 2 diabetes mellitus without complication, with long-term current use of insulin ([place])   7. Dyslipidemia    PLAN:    In order of problems listed above:  1. Chest pain with quite dramatic EKG changes.  Raising suspicion for LAD disease.  What complicates the situation quite significantly is the fact that she does have kidney dysfunction.  We debated what to do with the situation and I presented them with 3 options option #1 is just medical therapy, she #2 stress testing option might #3 cardiac catheterization with the understanding that cardiac catheterization can lead to some kidney dysfunction.  Both her as well as her daughter struggling with decision but they are leaning more towards doing cardiac catheterization.  I think what can help [place] with determination and what will be the best option is the fact that we can do troponin I, I will also schedule her to have an echocardiogram to assess left ventricle ejection fraction if troponin eyes abnormal if echocardiogram shows some diminished left ventricle ejection fraction then cardiac catheterization will be the best choice.  In the meantime I asked her to start taking 1 baby aspirin every single day, I will give her prescription for nitroglycerin, and I will give her prescription for metoprolol 25 twice daily.  I will see her back in the office very quickly within next few days to see how she does.  We may be able to make a decision before that  visit. 2. Essential hypertension blood pressure appears to well controlled continue  present management. 3. Type 2 diabetes.  I will check her hemoglobin A1c 4. Dyslipidemia with intolerance to multiple statin.  I initiated conversation about PCSK9 agent however final decision about therapy will be made after coronary artery disease will be assessed.   Medication Adjustments/Labs and Tests Ordered: Current medicines are reviewed at length with the patient today.  Concerns regarding medicines are outlined above.  Orders Placed This Encounter  Procedures  . EKG 12-Lead   No orders of the defined types were placed in this encounter.   Signed, Park Liter, MD, [facility]. 07/14/2019 2:11 PM    [place]

## 2019-07-14 NOTE — Patient Instructions (Signed)
Medication Instructions:  1.  START Metoprolol 25 mg taking 1 twice daily 2.  START Nitroglycerin 0.4 s/l tablets USE AS DIRECTED ON THE BOTTLE 3.  START Aspirin 81 mg taking 1 daily  *If you need a refill on your cardiac medications before your next appointment, please call your pharmacy*   Lab Work: TODAY:  CMP, CBC, HGBA1C, & TROPONIN I  If you have labs (blood work) drawn today and your tests are completely normal, you will receive your results only by: Marland Kitchen MyChart Message (if you have MyChart) OR . A paper copy in the mail If you have any lab test that is abnormal or we need to change your treatment, we will call you to review the results.   Testing/Procedures: Your physician has requested that you have an echocardiogram. Echocardiography is a painless test that uses sound waves to create images of your heart. It provides your doctor with information about the size and shape of your heart and how well your heart's chambers and valves are working. This procedure takes approximately one hour. There are no restrictions for this procedure.     Follow-Up: At [facility], you and your health needs are our priority.  As part of our continuing mission to provide you with exceptional heart care, we have created designated Provider Care Teams.  These Care Teams include your primary Cardiologist (physician) and Advanced Practice Providers (APPs -  Physician Assistants and Nurse Practitioners) who all work together to provide you with the care you need, when you need it.  We recommend signing up for the patient portal called "MyChart".  Sign up information is provided on this After Visit Summary.  MyChart is used to connect with patients for Virtual Visits (Telemedicine).  Patients are able to view lab/test results, encounter notes, upcoming appointments, etc.  Non-urgent messages can be sent to your provider as well.   To learn more about what you can do with MyChart, go to  [email or web address].    Your next appointment:   10 month(s)  The format for your next appointment:   In Person  Provider:   [name], MD   Other Instructions  Echocardiogram An echocardiogram is a procedure that uses painless sound waves (ultrasound) to produce an image of the heart. Images from an echocardiogram can provide important information about:  Signs of coronary artery disease (CAD).  Aneurysm detection. An aneurysm is a weak or damaged part of an artery wall that bulges out from the normal force of blood pumping through the body.  Heart size and shape. Changes in the size or shape of the heart can be associated with certain conditions, including heart failure, aneurysm, and CAD.  Heart muscle function.  Heart valve function.  Signs of a past heart attack.  Fluid buildup around the heart.  Thickening of the heart muscle.  A tumor or infectious growth around the heart valves. Tell a health care provider about:  Any allergies you have.  All medicines you are taking, including vitamins, herbs, eye drops, creams, and over-the-counter medicines.  Any blood disorders you have.  Any surgeries you have had.  Any medical conditions you have.  Whether you are pregnant or may be pregnant. What are the risks? Generally, this is a safe procedure. However, problems may occur, including:  Allergic reaction to dye (contrast) that may be used during the procedure. What happens before the procedure? No specific preparation is needed. You may eat and drink normally. What happens during the procedure?  An IV tube may be inserted into one of your veins.  You may receive contrast through this tube. A contrast is an injection that improves the quality of the pictures from your heart.  A gel will be applied to your chest.  A wand-like tool (transducer) will be moved over your chest. The gel will help to transmit the sound waves from the transducer.  The  sound waves will harmlessly bounce off of your heart to allow the heart images to be captured in real-time motion. The images will be recorded on a computer. The procedure may vary among health care providers and hospitals. What happens after the procedure?  You may return to your normal, everyday life, including diet, activities, and medicines, unless your health care provider tells you not to do that. Summary  An echocardiogram is a procedure that uses painless sound waves (ultrasound) to produce an image of the heart.  Images from an echocardiogram can provide important information about the size and shape of your heart, heart muscle function, heart valve function, and fluid buildup around your heart.  You do not need to do anything to prepare before this procedure. You may eat and drink normally.  After the echocardiogram is completed, you may return to your normal, everyday life, unless your health care provider tells you not to do that. This information is not intended to replace advice given to you by your health care provider. Make sure you discuss any questions you have with your health care provider. Document Revised: 07/31/2018 Document Reviewed: 05/12/2016 Elsevier Patient Education  [place].

## 2019-07-15 LAB — CBC
Hematocrit: 33.6 % — ABNORMAL LOW (ref 34.0–46.6)
Hemoglobin: 10.4 g/dL — ABNORMAL LOW (ref 11.1–15.9)
MCH: 24.6 pg — ABNORMAL LOW (ref 26.6–33.0)
MCHC: 31 g/dL — ABNORMAL LOW (ref 31.5–35.7)
MCV: 79 fL (ref 79–97)
Platelets: 136 10*3/uL — ABNORMAL LOW (ref 150–450)
RBC: 4.23 x10E6/uL (ref 3.77–5.28)
RDW: 14.1 % (ref 11.7–15.4)
WBC: 7.9 10*3/uL (ref 3.4–10.8)

## 2019-07-15 LAB — HEMOGLOBIN A1C
Est. average glucose Bld gHb Est-mCnc: 192 mg/dL
Hgb A1c MFr Bld: 8.3 % — ABNORMAL HIGH (ref 4.8–5.6)

## 2019-07-15 LAB — ECHOCARDIOGRAM COMPLETE
Height: 64 in
Weight: 2700.8 oz

## 2019-07-15 LAB — TROPONIN I: Troponin I: <0.01 ng/mL (ref 0.00–0.04)

## 2019-07-20 ENCOUNTER — Telehealth: Payer: Self-pay | Admitting: Emergency Medicine

## 2019-07-20 DIAGNOSIS — R0789 Other chest pain: Secondary | ICD-10-CM

## 2019-07-20 DIAGNOSIS — R072 Precordial pain: Secondary | ICD-10-CM

## 2019-07-20 DIAGNOSIS — I10 Essential (primary) hypertension: Secondary | ICD-10-CM

## 2019-07-20 NOTE — Telephone Encounter (Signed)
Called patient informed her per Dr. Agustin Cree the patient needs to have lab work drawn she verbally understood and will have this drawn

## 2019-07-22 LAB — BASIC METABOLIC PANEL
BUN/Creatinine Ratio: 13 (ref 12–28)
BUN: 23 mg/dL (ref 8–27)
CO2: 22 mmol/L (ref 20–29)
Calcium: 10.4 mg/dL — ABNORMAL HIGH (ref 8.7–10.3)
Chloride: 101 mmol/L (ref 96–106)
Creatinine, Ser: 1.81 mg/dL — ABNORMAL HIGH (ref 0.57–1.00)
GFR calc Af Amer: 31 mL/min/{1.73_m2} — ABNORMAL LOW (ref >59)
GFR calc non Af Amer: 27 mL/min/{1.73_m2} — ABNORMAL LOW (ref >59)
Glucose: 184 mg/dL — ABNORMAL HIGH (ref 65–99)
Potassium: 5 mmol/L (ref 3.5–5.2)
Sodium: 138 mmol/L (ref 134–144)

## 2019-07-23 ENCOUNTER — Encounter: Payer: Self-pay | Admitting: Family Medicine

## 2019-07-24 NOTE — Addendum Note (Signed)
Addended by: Ashok Norris on: 07/24/2019 04:51 PM   Modules accepted: Orders

## 2019-07-24 NOTE — Telephone Encounter (Signed)
Called patient informed her of results and recommendations to have a lexiscan. Cannot scheduler her since schedulers have are no longer at the office today. Informed her I will call her back Monday with instructions and appointment date and time. She verbally understood no further questions.

## 2019-07-27 ENCOUNTER — Telehealth: Payer: Self-pay | Admitting: Cardiology

## 2019-07-27 ENCOUNTER — Encounter: Payer: Self-pay | Admitting: Family Medicine

## 2019-07-27 ENCOUNTER — Telehealth (HOSPITAL_COMMUNITY): Payer: Self-pay | Admitting: *Deleted

## 2019-07-27 NOTE — Telephone Encounter (Signed)
Called patient reminded her of nuclear appointment. Also informed her to hold her diabetic medication glimepiride, humulin, victoza, and januvia since she will not be eating. She verbally understood and plans to also check her blood sugar that morning. She was already informed of instructions. No further questions.

## 2019-07-27 NOTE — Telephone Encounter (Signed)
Barbara Yu is calling stating she is returning a phone call. I was unable to find any documentation as to what the phone call was in regards to. Please advise.

## 2019-07-27 NOTE — Telephone Encounter (Signed)
Patient's daughter given detailed instructions per Myocardial Perfusion Study Information Sheet for the test on 07/29/19 at 10:45. Patient notified to arrive 15 minutes early and that it is imperative to arrive on time for appointment to keep from having the test rescheduled.  If you need to cancel or reschedule your appointment, please call the office within 24 hours of your appointment. . Patient's daughter verbalized understanding.Veronia Beets

## 2019-07-29 ENCOUNTER — Ambulatory Visit (HOSPITAL_COMMUNITY): Payer: Medicare Other | Attending: Cardiology

## 2019-07-29 ENCOUNTER — Other Ambulatory Visit: Payer: Self-pay

## 2019-07-29 VITALS — Ht 64.0 in | Wt 168.0 lb

## 2019-07-29 DIAGNOSIS — R9431 Abnormal electrocardiogram [ECG] [EKG]: Secondary | ICD-10-CM | POA: Diagnosis present

## 2019-07-29 DIAGNOSIS — R0789 Other chest pain: Secondary | ICD-10-CM | POA: Diagnosis present

## 2019-07-29 DIAGNOSIS — R072 Precordial pain: Secondary | ICD-10-CM | POA: Insufficient documentation

## 2019-07-29 LAB — MYOCARDIAL PERFUSION IMAGING
LV dias vol: 62 mL (ref 46–106)
LV sys vol: 25 mL
Peak HR: 103 {beats}/min
Rest HR: 78 {beats}/min
SDS: 5
SRS: 1
SSS: 6
TID: 1.01

## 2019-07-29 MED ORDER — TECHNETIUM TC 99M TETROFOSMIN IV KIT
31.9000 | PACK | Freq: Once | INTRAVENOUS | Status: AC | PRN
  Administered 2019-07-29: 31.9 via INTRAVENOUS
  Filled 2019-07-29: qty 32

## 2019-07-29 MED ORDER — REGADENOSON 0.4 MG/5ML IV SOLN
0.4000 mg | Freq: Once | INTRAVENOUS | Status: AC
  Administered 2019-07-29: 0.4 mg via INTRAVENOUS

## 2019-07-29 MED ORDER — TECHNETIUM TC 99M TETROFOSMIN IV KIT
10.8000 | PACK | Freq: Once | INTRAVENOUS | Status: AC | PRN
  Administered 2019-07-29: 10.8 via INTRAVENOUS
  Filled 2019-07-29: qty 11

## 2019-08-01 ENCOUNTER — Encounter: Payer: Self-pay | Admitting: Family Medicine

## 2019-08-03 NOTE — Telephone Encounter (Signed)
Routing to provider  

## 2019-08-05 MED ORDER — QUINAPRIL HCL 20 MG PO TABS: 20.0000 mg | ORAL_TABLET | Freq: Every day | ORAL | 0 refills | Status: DC

## 2019-08-05 MED ORDER — AMLODIPINE BESYLATE 5 MG PO TABS: 5.0000 mg | ORAL_TABLET | Freq: Every day | ORAL | 3 refills | Status: DC

## 2019-08-14 ENCOUNTER — Other Ambulatory Visit: Payer: Self-pay

## 2019-08-14 ENCOUNTER — Ambulatory Visit (INDEPENDENT_AMBULATORY_CARE_PROVIDER_SITE_OTHER): Payer: Medicare Other | Admitting: Cardiology

## 2019-08-14 ENCOUNTER — Encounter: Payer: Self-pay | Admitting: Cardiology

## 2019-08-14 VITALS — BP 152/76 | HR 80 | Ht 64.0 in | Wt 165.0 lb

## 2019-08-14 DIAGNOSIS — D750 Familial erythrocytosis: Secondary | ICD-10-CM

## 2019-08-14 DIAGNOSIS — R072 Precordial pain: Secondary | ICD-10-CM

## 2019-08-14 DIAGNOSIS — N184 Chronic kidney disease, stage 4 (severe): Secondary | ICD-10-CM

## 2019-08-14 DIAGNOSIS — I10 Essential (primary) hypertension: Secondary | ICD-10-CM | POA: Diagnosis not present

## 2019-08-14 DIAGNOSIS — R0789 Other chest pain: Secondary | ICD-10-CM

## 2019-08-14 DIAGNOSIS — E785 Hyperlipidemia, unspecified: Secondary | ICD-10-CM

## 2019-08-14 MED ORDER — PRAVASTATIN SODIUM 20 MG PO TABS: 20.0000 mg | ORAL_TABLET | Freq: Every evening | ORAL | 3 refills | Status: DC

## 2019-08-14 MED ORDER — ISOSORBIDE MONONITRATE ER 30 MG PO TB24: 30.0000 mg | ORAL_TABLET | Freq: Every day | ORAL | 3 refills | Status: DC

## 2019-08-14 NOTE — Progress Notes (Signed)
Cardiology Office Note:    Date:  08/14/2019   ID:  Barbara Yu, DOB Aug 25, 1945, MRN [id number]  PCP:  Barbara Marry, MD  Cardiologist:  [name], MD    Referring MD: Barbara Yu, *   Chief Complaint  Patient presents with  . Follow-up    1 Month  I am doing much better  History of Present Illness:    Barbara Yu is a 74 y.o. female with past medical history significant for diabetes, dyslipidemia, chronic kidney failure, essential hypertension.  I met her first time about a month ago when she was referred to me because of chest pain.  I was worried about symptomatology that she had on top of that she did have quite significant EKG changes.  We were getting ready to pursue cardiac catheterization however then we discovered that she does have some significant kidney dysfunction and we elected to pursue more conservative approach.  She did have troponin I done which was negative, after that she had echocardiogram done which showed preserved left ventricle ejection fraction and then stress test which showed no evidence of ischemia.  Since that time she seems to be doing well.  She said sometimes she gets tired and fatigue symptoms develop chest pain but only 1 episode since have seen her last time.  Past Medical History:  Diagnosis Date  . Anemia   . Diabetes mellitus without complication ([place])   . Hypertension   . Normal cardiac stress test 06/09/2012   at [facility] normal cardiac stress test (surgery preop); Dr Norman Clay  . Vitamin D deficiency     Past Surgical History:  Procedure Laterality Date  . BACK SURGERY      Current Medications: Current Meds  Medication Sig  . acetaminophen (TYLENOL) 325 MG tablet Take 650 mg by mouth as needed.  Marland Kitchen albuterol (VENTOLIN HFA) 108 (90 Base) MCG/ACT inhaler Inhale 2 puffs into the lungs every 6 (six) hours as needed.  Marland Kitchen amLODipine (NORVASC) 5 MG tablet Take 1 tablet (5 mg total) by mouth daily.  Marland Kitchen aspirin EC  81 MG tablet Take 1 tablet (81 mg total) by mouth daily.  . colchicine 0.6 MG tablet Take 1 tablet (0.6 mg total) by mouth daily as needed.  . diazepam (VALIUM) 10 MG tablet Take 10 mg by mouth as needed.  Marland Kitchen estradiol (ESTRACE) 0.1 MG/GM vaginal cream Place 1 Applicatorful vaginally 2 (two) times a week.  . estrogen, conjugated,-medroxyprogesterone (PREMPRO) 0.625-5 MG per tablet Take 1 tablet by mouth daily.    . Ferrous Sulfate (IRON) 325 (65 Fe) MG TABS Take 1 tablet by mouth 3 (three) times daily.  Marland Kitchen glipiZIDE (GLUCOTROL) 5 MG tablet Take 5 mg by mouth 2 (two) times daily.  Marland Kitchen glucose blood (FREESTYLE LITE) test strip For testing up to 4 times daily. DX: E11.9  . Insulin Isophane & Regular Human (HUMULIN 70/30 KWIKPEN) (70-30) 100 UNIT/ML PEN 34 units in AM and 32 units in evening.  . lactulose (CHRONULAC) 10 GM/15ML solution Take 15-30 mLs (10-20 g total) by mouth 2 (two) times daily as needed for mild constipation.  Marland Kitchen levothyroxine (SYNTHROID) 75 MCG tablet Take 1 tablet (75 mcg total) by mouth daily.  Marland Kitchen levothyroxine (SYNTHROID, LEVOTHROID) 50 MCG tablet TAKE 1 TABLET TWO TIMES A WEEK. ON SUNDAY AND WEDNESDAY. ALTERNATING WITH 75 MCG THE OTHER 5 DAYS  . liraglutide (VICTOZA) 18 MG/3ML SOPN Inject 0.2 mLs (1.2 mg total) into the skin daily.  . Magnesium 250 MG TABS Take  by mouth.  . metoprolol tartrate (LOPRESSOR) 25 MG tablet Take 1 tablet (25 mg total) by mouth 2 (two) times daily.  . montelukast (SINGULAIR) 10 MG tablet Take 1 tablet (10 mg total) by mouth at bedtime.  Marland Kitchen NARCAN 4 MG/0.1ML LIQD nasal spray kit CALL 911. ADMINISTER A SINGLE SPRAY OF NARCAN IN ONE NOSTRIL. REPEAT EVERY 3 MINUTES AS NEEDED IF NO OR MINIMAL RESPONSE.  Marland Kitchen Oxycodone HCl 10 MG TABS   . polyethylene glycol (MIRALAX / GLYCOLAX) packet Take 17 g by mouth as needed.  . quinapril (ACCUPRIL) 20 MG tablet Take 1 tablet (20 mg total) by mouth at bedtime.  . tolterodine (DETROL LA) 4 MG 24 hr capsule Take 1 capsule (4 mg  total) by mouth daily.  . Triamcinolone Acetonide (TRIAMCINOLONE 0.1 % CREAM : EUCERIN) CREA Apply 1 application topically 2 (two) times daily as needed for itching. 1 jar  . Vitamin D, Ergocalciferol, (DRISDOL) 1.25 MG (50000 UT) CAPS capsule TAKE 1 CAPSULE ONCE A WEEK  . XTAMPZA ER 18 MG C12A Take 1 capsule by mouth 2 (two) times daily.     Allergies:   Amoxicillin, Atorvastatin, Hydrochlorothiazide, Nsaids, Tramadol, Allopurinol, Morphine, Omeprazole, Pepcid [famotidine], Trulicity [dulaglutide], Ciprofloxacin, Gabapentin, and Pantoprazole   Social History   Socioeconomic History  . Marital status: Widowed    Spouse name: Not on file  . Number of children: Not on file  . Years of education: Not on file  . Highest education level: Not on file  Occupational History  . Occupation: retired  Tobacco Use  . Smoking status: Former Research scientist (life sciences)  . Smokeless tobacco: Never Used  Substance and Sexual Activity  . Alcohol use: No  . Drug use: No  . Sexual activity: Never  Other Topics Concern  . Not on file  Social History Narrative  . Not on file   Social Determinants of Health   Financial Resource Strain:   . Difficulty of Paying Living Expenses:   Food Insecurity:   . Worried About Charity fundraiser in the Last Year:   . Arboriculturist in the Last Year:   Transportation Needs:   . Film/video editor (Medical):   Marland Kitchen Lack of Transportation (Non-Medical):   Physical Activity:   . Days of Exercise per Week:   . Minutes of Exercise per Session:   Stress:   . Feeling of Stress :   Social Connections:   . Frequency of Communication with Friends and Family:   . Frequency of Social Gatherings with Friends and Family:   . Attends Religious Services:   . Active Member of Clubs or Organizations:   . Attends Archivist Meetings:   Marland Kitchen Marital Status:      Family History: The patient's family history is not on file. ROS:   Please see the history of present illness.      All 14 point review of systems negative except as described per history of present illness  EKGs/Labs/Other Studies Reviewed:    EKG today showed normal sinus rhythm normal P interval nonspecific ST segment changes she does have flattened T waves in lead V2 and minimal T inversion in lead V3 V4  Recent Labs: 06/26/2019: ALT 59; TSH 0.98 07/14/2019: Hemoglobin 10.4; Platelets 136 07/21/2019: BUN 23; Creatinine, Ser 1.81; Potassium 5.0; Sodium 138  Recent Lipid Panel    Component Value Date/Time   CHOL 154 02/23/2019 1356   TRIG 301 (H) 02/23/2019 1356   HDL 46 (L) 02/23/2019 1356  CHOLHDL 3.3 02/23/2019 1356   LDLCALC 72 02/23/2019 1356    Physical Exam:    VS:  BP (!) 152/76   Pulse 80   Ht 5' 4"  (1.626 m)   Wt 165 lb (74.8 kg)   SpO2 99%   BMI 28.32 kg/m     Wt Readings from Last 3 Encounters:  08/14/19 165 lb (74.8 kg)  07/29/19 168 lb (76.2 kg)  07/14/19 168 lb 12.8 oz (76.6 kg)     GEN:  Well nourished, well developed in no acute distress HEENT: Normal NECK: No JVD; No carotid bruits LYMPHATICS: No lymphadenopathy CARDIAC: RRR, no murmurs, no rubs, no gallops RESPIRATORY:  Clear to auscultation without rales, wheezing or rhonchi  ABDOMEN: Soft, non-tender, non-distended MUSCULOSKELETAL:  No edema; No deformity  SKIN: Warm and dry LOWER EXTREMITIES: no swelling NEUROLOGIC:  Alert and oriented x 3 PSYCHIATRIC:  Normal affect   ASSESSMENT:    1. Essential hypertension   2. Atypical chest pain   3. Essential hypererythropoietinemia   4. Precordial chest pain   5. CKD (chronic kidney disease) stage 4, GFR 15-29 ml/min (HCC)   6. Dyslipidemia    PLAN:    In order of problems listed above:  1. Chest pain suspicion for coronary artery disease however stress test negative.  I will add Imdur 30 mg to her medical regimen.  She had one episode of chest pain, try nitroglycerin with good relief.  We will continue monitoring her very carefully, therefore I see her  back in about 1 month.  Also asked her to let me know if she develop any chest pain.  Asked also to let me know if chest pain became worse.  I still think we may be required cardiac catheterization, however, I prefer to avoid it because of kidney dysfunction. 2. Essential hypertension her blood pressure seems to be well controlled. 3. Chronic kidney disease.  Noted. 4. Dyslipidemia on high intensity statin which I will continue   Medication Adjustments/Labs and Tests Ordered: Current medicines are reviewed at length with the patient today.  Concerns regarding medicines are outlined above.  Orders Placed This Encounter  Procedures  . EKG 12-Lead   Medication changes:  Meds ordered this encounter  Medications  . isosorbide mononitrate (IMDUR) 30 MG 24 hr tablet    Sig: Take 1 tablet (30 mg total) by mouth daily.    Dispense:  90 tablet    Refill:  3  . pravastatin (PRAVACHOL) 20 MG tablet    Sig: Take 1 tablet (20 mg total) by mouth every evening.    Dispense:  90 tablet    Refill:  3    Signed, Park Liter, MD, [facility] 08/14/2019 4:48 PM    [place]

## 2019-08-14 NOTE — Patient Instructions (Signed)
Medication Instructions:  Your physician has recommended you make the following change in your medication: 1. Start imdur one tablet (30 mg) daily 2. Pravastatin one tablet ( 20 mg) daily 3.  Sent in to requested pharmacy.    Labwork: -None  Testing/Procedures: -None  Follow-Up: Your physician recommends that you keep your scheduled follow-up appointment in: June with Dr. Fraser Din.    Any Other Special Instructions Will Be Listed Below (If Applicable).     If you need a refill on your cardiac medications before your next appointment, please call your pharmacy.

## 2019-08-17 ENCOUNTER — Other Ambulatory Visit: Payer: Self-pay | Admitting: Family Medicine

## 2019-08-17 MED ORDER — TERCONAZOLE 0.4 % VA CREA: 1.0000 | TOPICAL_CREAM | Freq: Every day | VAGINAL | 1 refills | Status: DC

## 2019-08-21 ENCOUNTER — Ambulatory Visit (INDEPENDENT_AMBULATORY_CARE_PROVIDER_SITE_OTHER): Payer: Medicare Other | Admitting: Family Medicine

## 2019-08-21 ENCOUNTER — Ambulatory Visit: Payer: Medicare Other | Admitting: Family Medicine

## 2019-08-21 ENCOUNTER — Encounter: Payer: Self-pay | Admitting: Family Medicine

## 2019-08-21 ENCOUNTER — Other Ambulatory Visit: Payer: Self-pay

## 2019-08-21 VITALS — BP 122/61 | HR 77 | Ht 64.0 in | Wt 162.0 lb

## 2019-08-21 DIAGNOSIS — E785 Hyperlipidemia, unspecified: Secondary | ICD-10-CM

## 2019-08-21 DIAGNOSIS — I10 Essential (primary) hypertension: Secondary | ICD-10-CM

## 2019-08-21 DIAGNOSIS — Z794 Long term (current) use of insulin: Secondary | ICD-10-CM

## 2019-08-21 DIAGNOSIS — N760 Acute vaginitis: Secondary | ICD-10-CM

## 2019-08-21 DIAGNOSIS — N184 Chronic kidney disease, stage 4 (severe): Secondary | ICD-10-CM | POA: Diagnosis not present

## 2019-08-21 DIAGNOSIS — E119 Type 2 diabetes mellitus without complications: Secondary | ICD-10-CM | POA: Diagnosis not present

## 2019-08-21 NOTE — Assessment & Plan Note (Signed)
Encouraged her to go ahead and start the pravastatin.

## 2019-08-21 NOTE — Assessment & Plan Note (Signed)
Pressure looks fantastic today.  Continue current regimen. 

## 2019-08-21 NOTE — Progress Notes (Signed)
Established Patient Office Visit  Subjective:  Patient ID: Barbara Yu, female    DOB: [birth year]  Age: 74 y.o. MRN: [id number]  CC:  Chief Complaint  Patient presents with  . Diabetes  . Vaginal Itching    x 1.5-2 wk    HPI [name] presents for   Follow-up diabetes-we recently made a few changes and adjustments to her regimen, simplify things a little bit and for cost purposes. No lows in the last 6 weeks.  She often eats very little for meals and sometimes only eats a couple of times a day.  She does try to get about 2000 steps per day around the house but does not have any specific purposeful exercise daily.  Overall she feels fairly happy and content she does not feel like she is down or sad but sometimes she feels a little stressed and irritable and anxious.  She did follow-up with cardiologist for some of the chest tightness that she was experiencing.  Usually when it happens it occurs when she is feeling a little stressed or overwhelmed.  She says she will usually try taking a Tums and sometimes that works and if it does not and she will usually try taking a nitroglycerin.  The cardiologist recently put her on a nitrate to try to see if this improves the angina but she has not started it yet.  She was also recently started on a statin but has not started that yet either but plans to.  Also complains of vaginal itching for about the last week and 1/2 to 2 weeks.    Past Medical History:  Diagnosis Date  . Anemia   . Diabetes mellitus without complication ([place])   . Hypertension   . Normal cardiac stress test 06/09/2012   at [facility] normal cardiac stress test (surgery preop); Dr Norman Clay  . Vitamin D deficiency     Past Surgical History:  Procedure Laterality Date  . BACK SURGERY      History reviewed. No pertinent family history.  Social History   Socioeconomic History  . Marital status: Widowed    Spouse name: Not on file  . Number of children: Not on  file  . Years of education: Not on file  . Highest education level: Not on file  Occupational History  . Occupation: retired  Tobacco Use  . Smoking status: Former Research scientist (life sciences)  . Smokeless tobacco: Never Used  Substance and Sexual Activity  . Alcohol use: No  . Drug use: No  . Sexual activity: Never  Other Topics Concern  . Not on file  Social History Narrative  . Not on file   Social Determinants of Health   Financial Resource Strain:   . Difficulty of Paying Living Expenses:   Food Insecurity:   . Worried About Charity fundraiser in the Last Year:   . Arboriculturist in the Last Year:   Transportation Needs:   . Film/video editor (Medical):   Marland Kitchen Lack of Transportation (Non-Medical):   Physical Activity:   . Days of Exercise per Week:   . Minutes of Exercise per Session:   Stress:   . Feeling of Stress :   Social Connections:   . Frequency of Communication with Friends and Family:   . Frequency of Social Gatherings with Friends and Family:   . Attends Religious Services:   . Active Member of Clubs or Organizations:   . Attends Archivist Meetings:   Marland Kitchen Marital  Status:   Intimate Partner Violence:   . Fear of Current or Ex-Partner:   . Emotionally Abused:   Marland Kitchen Physically Abused:   . Sexually Abused:     Outpatient Medications Prior to Visit  Medication Sig Dispense Refill  . acetaminophen (TYLENOL) 325 MG tablet Take 650 mg by mouth as needed.    Marland Kitchen albuterol (VENTOLIN HFA) 108 (90 Base) MCG/ACT inhaler Inhale 2 puffs into the lungs every 6 (six) hours as needed. 54 g 4  . amLODipine (NORVASC) 5 MG tablet Take 1 tablet (5 mg total) by mouth daily. 90 tablet 3  . colchicine 0.6 MG tablet Take 1 tablet (0.6 mg total) by mouth daily as needed. 1 tablet 0  . diazepam (VALIUM) 10 MG tablet Take 10 mg by mouth as needed.    . Ferrous Sulfate (IRON) 325 (65 Fe) MG TABS Take 1 tablet by mouth 3 (three) times daily.    Marland Kitchen glipiZIDE (GLUCOTROL) 5 MG tablet Take 5 mg  by mouth 2 (two) times daily.    Marland Kitchen glucose blood (FREESTYLE LITE) test strip For testing up to 4 times daily. DX: E11.9 400 each 4  . Insulin Isophane & Regular Human (HUMULIN 70/30 KWIKPEN) (70-30) 100 UNIT/ML PEN 34 units in AM and 32 units in evening. 45 mL 4  . isosorbide mononitrate (IMDUR) 30 MG 24 hr tablet Take 1 tablet (30 mg total) by mouth daily. 90 tablet 3  . lactulose (CHRONULAC) 10 GM/15ML solution Take 15-30 mLs (10-20 g total) by mouth 2 (two) times daily as needed for mild constipation. 1892 mL 3  . levothyroxine (SYNTHROID) 75 MCG tablet Take 1 tablet (75 mcg total) by mouth daily. 90 tablet 3  . levothyroxine (SYNTHROID, LEVOTHROID) 50 MCG tablet TAKE 1 TABLET TWO TIMES A WEEK. ON SUNDAY AND WEDNESDAY. ALTERNATING WITH 75 MCG THE OTHER 5 DAYS 90 tablet 4  . liraglutide (VICTOZA) 18 MG/3ML SOPN Inject 0.2 mLs (1.2 mg total) into the skin daily. 18 mL 3  . metoprolol tartrate (LOPRESSOR) 25 MG tablet Take 1 tablet (25 mg total) by mouth 2 (two) times daily. 180 tablet 3  . montelukast (SINGULAIR) 10 MG tablet Take 1 tablet (10 mg total) by mouth at bedtime. 90 tablet 3  . NARCAN 4 MG/0.1ML LIQD nasal spray kit CALL 911. ADMINISTER A SINGLE SPRAY OF NARCAN IN ONE NOSTRIL. REPEAT EVERY 3 MINUTES AS NEEDED IF NO OR MINIMAL RESPONSE.  0  . Oxycodone HCl 10 MG TABS     . polyethylene glycol (MIRALAX / GLYCOLAX) packet Take 17 g by mouth as needed.    . quinapril (ACCUPRIL) 20 MG tablet Take 1 tablet (20 mg total) by mouth at bedtime. 90 tablet 0  . terconazole (TERAZOL 7) 0.4 % vaginal cream Place 1 applicator vaginally at bedtime. 45 g 1  . tolterodine (DETROL LA) 4 MG 24 hr capsule Take 1 capsule (4 mg total) by mouth daily. 90 capsule 4  . Triamcinolone Acetonide (TRIAMCINOLONE 0.1 % CREAM : EUCERIN) CREA Apply 1 application topically 2 (two) times daily as needed for itching. 1 jar 1 each 1  . Vitamin D, Ergocalciferol, (DRISDOL) 1.25 MG (50000 UT) CAPS capsule TAKE 1 CAPSULE ONCE A  WEEK 30 capsule 4  . XTAMPZA ER 18 MG C12A Take 1 capsule by mouth 2 (two) times daily.    . pravastatin (PRAVACHOL) 20 MG tablet Take 1 tablet (20 mg total) by mouth every evening. (Patient not taking: Reported on 08/21/2019) 90 tablet 3  .  aspirin EC 81 MG tablet Take 1 tablet (81 mg total) by mouth daily. 90 tablet 3   No facility-administered medications prior to visit.    Allergies  Allergen Reactions  . Amoxicillin Diarrhea and Itching    Other reaction(s): Confusion (intolerance)  . Atorvastatin Other (See Comments)    Burning skin sensation  . Hydrochlorothiazide     Other reaction(s): Other Chronic renal failure  . Nsaids Other (See Comments)    Peptic ulcer disease  . Tramadol Nausea And Vomiting    Other reaction(s): GI Upset (intolerance)  . Allopurinol Other (See Comments)    Makes her stomach hurt  . Morphine Other (See Comments)    Hallucinations  . Omeprazole Diarrhea  . Pepcid [Famotidine] Other (See Comments)    Cause her to become gasy  . Trulicity [Dulaglutide] Other (See Comments)  . Ciprofloxacin     Other reaction(s): GI Upset (intolerance) Other reaction(s): Abdominal Pain Other reaction(s): Abdominal Pain  . Gabapentin Nausea Only    Feels "stupid" and wt gain   . Pantoprazole Diarrhea    ROS Review of Systems    Objective:    Physical Exam  Constitutional: She is oriented to person, place, and time. She appears well-developed and well-nourished.  HENT:  Head: Normocephalic and atraumatic.  Cardiovascular: Normal rate, regular rhythm and normal heart sounds.  Pulmonary/Chest: Effort normal and breath sounds normal.  Neurological: She is alert and oriented to person, place, and time.  Skin: Skin is warm and dry.  Psychiatric: She has a normal mood and affect. Her behavior is normal.    BP 122/61   Pulse 77   Ht _0  (1.626 m)   Wt 162 lb (73.5 kg)   SpO2 100%   BMI 27.81 kg/m  Wt Readings from Last 3 Encounters:  08/21/19 162 lb  (73.5 kg)  08/14/19 165 lb (74.8 kg)  07/29/19 168 lb (76.2 kg)     There are no preventive care reminders to display for this patient.  There are no preventive care reminders to display for this patient.  Lab Results  Component Value Date   TSH 0.98 06/26/2019   Lab Results  Component Value Date   WBC 7.9 07/14/2019   HGB 10.4 (L) 07/14/2019   HCT 33.6 (L) 07/14/2019   MCV 79 07/14/2019   PLT 136 (L) 07/14/2019   Lab Results  Component Value Date   NA 138 07/21/2019   K 5.0 07/21/2019   CO2 22 07/21/2019   GLUCOSE 184 (H) 07/21/2019   BUN 23 07/21/2019   CREATININE 1.81 (H) 07/21/2019   BILITOT 0.4 06/26/2019   ALKPHOS 57 11/28/2016   AST 80 (H) 06/26/2019   ALT 59 (H) 06/26/2019   PROT 7.6 06/26/2019   ALBUMIN 4.3 11/28/2016   CALCIUM 10.4 (H) 07/21/2019   Lab Results  Component Value Date   CHOL 154 02/23/2019   Lab Results  Component Value Date   HDL 46 (L) 02/23/2019   Lab Results  Component Value Date   LDLCALC 72 02/23/2019   Lab Results  Component Value Date   TRIG 301 (H) 02/23/2019   Lab Results  Component Value Date   CHOLHDL 3.3 02/23/2019   Lab Results  Component Value Date   HGBA1C 8.3 (H) 07/14/2019      Assessment & Plan:   Problem List Items Addressed This Visit      Cardiovascular and Mediastinum   HTN (hypertension) - Primary    Pressure looks fantastic today.  Continue current regimen.        Endocrine   Type 2 diabetes mellitus without complications (HCC)    Last A1c was not well controlled at 8.3 approximately 4 weeks ago.  It does look like her 14-day average was around 180 so we discussed a goal of getting her A1c under 8.0.  She has been difficult to control her blood sugars because she does eat so irregularly that it makes a little challenging to control her insulin without causing hypoglycemia.  Fortunately she has not had any hypoglycemia in the last 6 weeks.  She is currently on an ACE inhibitor and is planning  to start the pravastatin soon.  Did discuss with her that if she starts to experience any myalgias that is okay to take it every other day if needed versus just stopping it.        Genitourinary   CKD (chronic kidney disease) stage 4, GFR 15-29 ml/min (HCC)    Did have an appointment with nephrology they will be seeing her back soon.        Other   Dyslipidemia    Encouraged her to go ahead and start the pravastatin.       Other Visit Diagnoses    Acute vaginitis       Relevant Orders   SureSwab, Vaginosis/Vaginitis Plus     Vaginitis-wet prep performed.  Will call patient with results.  No orders of the defined types were placed in this encounter.   Follow-up: Return in about 9 weeks (around 10/23/2019) for Diabetes follow-up.    Beatrice Lecher, MD

## 2019-08-21 NOTE — Assessment & Plan Note (Signed)
Last A1c was not well controlled at 8.3 approximately 4 weeks ago.  It does look like her 14-day average was around 180 so we discussed a goal of getting her A1c under 8.0.  She has been difficult to control her blood sugars because she does eat so irregularly that it makes a little challenging to control her insulin without causing hypoglycemia.  Fortunately she has not had any hypoglycemia in the last 6 weeks.  She is currently on an ACE inhibitor and is planning to start the pravastatin soon.  Did discuss with her that if she starts to experience any myalgias that is okay to take it every other day if needed versus just stopping it.

## 2019-08-21 NOTE — Assessment & Plan Note (Signed)
Did have an appointment with nephrology they will be seeing her back soon.

## 2019-08-21 NOTE — Patient Instructions (Signed)
Work on increasing steps to 3000 a day.  Add protein to your carbs.

## 2019-08-21 NOTE — Progress Notes (Signed)
Pt's blood sugar averages are as follows:  7 day    181 14 day  192 30 day  175

## 2019-08-22 ENCOUNTER — Other Ambulatory Visit: Payer: Self-pay | Admitting: Family Medicine

## 2019-08-24 ENCOUNTER — Other Ambulatory Visit: Payer: Self-pay | Admitting: *Deleted

## 2019-08-24 NOTE — Telephone Encounter (Signed)
Refill for victoza pens has already been sent.

## 2019-08-25 LAB — SURESWAB, VAGINOSIS/VAGINITIS PLUS
Atopobium vaginae: NOT DETECTED Log (cells/mL)
C. albicans, DNA: NOT DETECTED
C. glabrata, DNA: NOT DETECTED
C. parapsilosis, DNA: NOT DETECTED
C. trachomatis RNA, TMA: NOT DETECTED
C. tropicalis, DNA: NOT DETECTED
Gardnerella vaginalis: <4.7 Log (cells/mL)
LACTOBACILLUS SPECIES: 7.1 Log (cells/mL)
MEGASPHAERA SPECIES: NOT DETECTED Log (cells/mL)
N. gonorrhoeae RNA, TMA: NOT DETECTED
Trichomonas vaginalis RNA: NOT DETECTED

## 2019-09-01 ENCOUNTER — Telehealth: Payer: Self-pay | Admitting: *Deleted

## 2019-09-01 NOTE — Telephone Encounter (Signed)
Called and stated that she has noticed some R sided facial swelling, and bilateral ankle and foot swelling.   She questions whether or not it could be coming from the recent changes in some of her medications? She did notice that the swelling does go down in the mornings.   Her recent changes have been Amlodipine, Glipizide, quinapril, (cardiology started her on Imdur,metoprolol, pravastatin).

## 2019-09-02 ENCOUNTER — Telehealth: Payer: Self-pay | Admitting: *Deleted

## 2019-09-02 DIAGNOSIS — Z794 Long term (current) use of insulin: Secondary | ICD-10-CM

## 2019-09-02 DIAGNOSIS — E119 Type 2 diabetes mellitus without complications: Secondary | ICD-10-CM

## 2019-09-02 MED ORDER — VICTOZA 18 MG/3ML ~~LOC~~ SOPN: 1.2000 mg | PEN_INJECTOR | Freq: Every day | SUBCUTANEOUS | 3 refills | Status: DC

## 2019-09-02 NOTE — Telephone Encounter (Signed)
Left message advising of recommendations and advised to call back.

## 2019-09-02 NOTE — Telephone Encounter (Signed)
OK let cut back down on the amlodipine to half a tab for 2 weeks and see if improves.

## 2019-09-02 NOTE — Telephone Encounter (Signed)
ERR

## 2019-09-04 NOTE — Telephone Encounter (Signed)
Pt advised of recommendations.  

## 2019-09-18 ENCOUNTER — Encounter: Payer: Self-pay | Admitting: Family Medicine

## 2019-09-25 ENCOUNTER — Other Ambulatory Visit: Payer: Self-pay | Admitting: Cardiology

## 2019-09-25 ENCOUNTER — Ambulatory Visit (INDEPENDENT_AMBULATORY_CARE_PROVIDER_SITE_OTHER): Payer: Medicare Other | Admitting: Cardiology

## 2019-09-25 ENCOUNTER — Other Ambulatory Visit: Payer: Self-pay

## 2019-09-25 ENCOUNTER — Encounter: Payer: Self-pay | Admitting: Cardiology

## 2019-09-25 VITALS — BP 140/62 | HR 94 | Ht 64.0 in | Wt 165.0 lb

## 2019-09-25 DIAGNOSIS — N184 Chronic kidney disease, stage 4 (severe): Secondary | ICD-10-CM | POA: Diagnosis not present

## 2019-09-25 DIAGNOSIS — I10 Essential (primary) hypertension: Secondary | ICD-10-CM

## 2019-09-25 DIAGNOSIS — R072 Precordial pain: Secondary | ICD-10-CM

## 2019-09-25 DIAGNOSIS — E785 Hyperlipidemia, unspecified: Secondary | ICD-10-CM

## 2019-09-25 NOTE — Patient Instructions (Signed)
Medication Instructions:  Your physician recommends that you continue on your current medications as directed. Please refer to the Current Medication list given to you today.  *If you need a refill on your cardiac medications before your next appointment, please call your pharmacy*   Lab Work: Your physician recommends that you return for lab work today: lipid  If you have labs (blood work) drawn today and your tests are completely normal, you will receive your results only by: . MyChart Message (if you have MyChart) OR . A paper copy in the mail If you have any lab test that is abnormal or we need to change your treatment, we will call you to review the results.   Testing/Procedures: None   Follow-Up: At [facility], you and your health needs are our priority.  As part of our continuing mission to provide you with exceptional heart care, we have created designated Provider Care Teams.  These Care Teams include your primary Cardiologist (physician) and Advanced Practice Providers (APPs -  Physician Assistants and Nurse Practitioners) who all work together to provide you with the care you need, when you need it.  We recommend signing up for the patient portal called "MyChart".  Sign up information is provided on this After Visit Summary.  MyChart is used to connect with patients for Virtual Visits (Telemedicine).  Patients are able to view lab/test results, encounter notes, upcoming appointments, etc.  Non-urgent messages can be sent to your provider as well.   To learn more about what you can do with MyChart, go to [email or web address].    Your next appointment:   3 month(s)  The format for your next appointment:   In Person  Provider:   Robert Krasowski, MD   Other Instructions   

## 2019-09-25 NOTE — Progress Notes (Signed)
Cardiology Office Note:    Date:  09/25/2019   ID:  Barbara Yu, DOB 1945-12-28, MRN [id number]  PCP:  [name], MD  Cardiologist:  [name], MD    Referring MD: [name], *   Chief Complaint  Patient presents with  . Follow-up    6 WK FU     History of Present Illness:    Barbara Yu is a 74 y.o. female with past medical history significant for essential hypertension, precordial chest pain, chronic kidney disease, dyslipidemia.  She comes today to my office for follow-up.  She did have a stress test done which showed no evidence of ischemia she still have chest pain which is suspicion for angina pectoris.  We will talk about potentially doing cardiac catheterization but because of her kidney dysfunction would prefer to manage this medically.  Therefore, I put her on Imdur and she reports feeling better on that.  Still has some chest pain but those happening maybe once a week.  And she is very happy the way she is.  Past Medical History:  Diagnosis Date  . Anemia   . Diabetes mellitus without complication ([place])   . Hypertension   . Normal cardiac stress test 06/09/2012   at [facility] normal cardiac stress test (surgery preop); Dr Norman Clay  . Vitamin D deficiency     Past Surgical History:  Procedure Laterality Date  . BACK SURGERY      Current Medications: Current Meds  Medication Sig  . acetaminophen (TYLENOL) 325 MG tablet Take 650 mg by mouth as needed.  Marland Kitchen albuterol (VENTOLIN HFA) 108 (90 Base) MCG/ACT inhaler Inhale 2 puffs into the lungs every 6 (six) hours as needed.  Marland Kitchen amLODipine (NORVASC) 5 MG tablet Take 1 tablet (5 mg total) by mouth daily.  . colchicine 0.6 MG tablet Take 1 tablet (0.6 mg total) by mouth daily as needed.  . diazepam (VALIUM) 10 MG tablet Take 10 mg by mouth as needed.  . Ferrous Sulfate (IRON) 325 (65 Fe) MG TABS Take 1 tablet by mouth 3 (three) times daily.  Marland Kitchen glipiZIDE (GLUCOTROL) 5 MG tablet Take 5 mg by  mouth 2 (two) times daily.  Marland Kitchen glucose blood (FREESTYLE LITE) test strip For testing up to 4 times daily. DX: E11.9  . HUMULIN 70/30 KWIKPEN (70-30) 100 UNIT/ML KwikPen INJECT 34 UNITS IN THE MORNING AND 32 UNITS IN THE EVENING  . isosorbide mononitrate (IMDUR) 30 MG 24 hr tablet Take 1 tablet (30 mg total) by mouth daily.  Marland Kitchen lactulose (CHRONULAC) 10 GM/15ML solution Take 15-30 mLs (10-20 g total) by mouth 2 (two) times daily as needed for mild constipation.  Marland Kitchen levothyroxine (SYNTHROID) 50 MCG tablet TAKE 1 TABLET TWO TIMES A WEEK. ON SUNDAY AND WEDNESDAY. ALTERNATING WITH 75 MCG THE OTHER 5 DAYS  . levothyroxine (SYNTHROID) 75 MCG tablet Take 1 tablet (75 mcg total) by mouth daily.  Marland Kitchen liraglutide (VICTOZA) 18 MG/3ML SOPN Inject 0.2 mLs (1.2 mg total) into the skin daily.  . metoprolol tartrate (LOPRESSOR) 25 MG tablet Take 1 tablet (25 mg total) by mouth 2 (two) times daily.  . montelukast (SINGULAIR) 10 MG tablet Take 1 tablet (10 mg total) by mouth at bedtime.  Marland Kitchen NARCAN 4 MG/0.1ML LIQD nasal spray kit CALL 911. ADMINISTER A SINGLE SPRAY OF NARCAN IN ONE NOSTRIL. REPEAT EVERY 3 MINUTES AS NEEDED IF NO OR MINIMAL RESPONSE.  Marland Kitchen Oxycodone HCl 10 MG TABS   . polyethylene glycol (MIRALAX / GLYCOLAX) packet  Take 17 g by mouth as needed.  . pravastatin (PRAVACHOL) 20 MG tablet Take 1 tablet (20 mg total) by mouth every evening.  . quinapril (ACCUPRIL) 20 MG tablet Take 1 tablet (20 mg total) by mouth at bedtime.  Marland Kitchen terconazole (TERAZOL 7) 0.4 % vaginal cream Place 1 applicator vaginally at bedtime.  . tolterodine (DETROL LA) 4 MG 24 hr capsule Take 1 capsule (4 mg total) by mouth daily.  . Triamcinolone Acetonide (TRIAMCINOLONE 0.1 % CREAM : EUCERIN) CREA Apply 1 application topically 2 (two) times daily as needed for itching. 1 jar  . Vitamin D, Ergocalciferol, (DRISDOL) 1.25 MG (50000 UT) CAPS capsule TAKE 1 CAPSULE ONCE A WEEK  . XTAMPZA ER 18 MG C12A Take 1 capsule by mouth 2 (two) times daily.      Allergies:   Amoxicillin, Atorvastatin, Hydrochlorothiazide, Nsaids, Tramadol, Allopurinol, Morphine, Omeprazole, Pepcid [famotidine], Trulicity [dulaglutide], Ciprofloxacin, Gabapentin, and Pantoprazole   Social History   Socioeconomic History  . Marital status: Widowed    Spouse name: Not on file  . Number of children: Not on file  . Years of education: Not on file  . Highest education level: Not on file  Occupational History  . Occupation: retired  Tobacco Use  . Smoking status: Former Smoker    Quit date: 09/08/1979    Years since quitting: 40.0  . Smokeless tobacco: Never Used  Substance and Sexual Activity  . Alcohol use: No  . Drug use: No  . Sexual activity: Never  Other Topics Concern  . Not on file  Social History Narrative  . Not on file   Social Determinants of Health   Financial Resource Strain:   . Difficulty of Paying Living Expenses:   Food Insecurity:   . Worried About Charity fundraiser in the Last Year:   . Arboriculturist in the Last Year:   Transportation Needs:   . Film/video editor (Medical):   Marland Kitchen Lack of Transportation (Non-Medical):   Physical Activity:   . Days of Exercise per Week:   . Minutes of Exercise per Session:   Stress:   . Feeling of Stress :   Social Connections:   . Frequency of Communication with Friends and Family:   . Frequency of Social Gatherings with Friends and Family:   . Attends Religious Services:   . Active Member of Clubs or Organizations:   . Attends Archivist Meetings:   Marland Kitchen Marital Status:      Family History: The patient's family history is not on file. ROS:   Please see the history of present illness.    All 14 point review of systems negative except as described per history of present illness  EKGs/Labs/Other Studies Reviewed:      Recent Labs: 06/26/2019: ALT 59; TSH 0.98 07/14/2019: Hemoglobin 10.4; Platelets 136 07/21/2019: BUN 23; Creatinine, Ser 1.81; Potassium 5.0; Sodium 138   Recent Lipid Panel    Component Value Date/Time   CHOL 154 02/23/2019 1356   TRIG 301 (H) 02/23/2019 1356   HDL 46 (L) 02/23/2019 1356   CHOLHDL 3.3 02/23/2019 1356   LDLCALC 72 02/23/2019 1356    Physical Exam:    VS:  BP 140/62   Pulse 94   Ht 5' 4"  (1.626 m)   Wt 165 lb (74.8 kg)   SpO2 98%   BMI 28.32 kg/m     Wt Readings from Last 3 Encounters:  09/25/19 165 lb (74.8 kg)  08/21/19 162 lb (  73.5 kg)  08/14/19 165 lb (74.8 kg)     GEN:  Well nourished, well developed in no acute distress HEENT: Normal NECK: No JVD; No carotid bruits LYMPHATICS: No lymphadenopathy CARDIAC: RRR, no murmurs, no rubs, no gallops RESPIRATORY:  Clear to auscultation without rales, wheezing or rhonchi  ABDOMEN: Soft, non-tender, non-distended MUSCULOSKELETAL:  No edema; No deformity  SKIN: Warm and dry LOWER EXTREMITIES: no swelling NEUROLOGIC:  Alert and oriented x 3 PSYCHIATRIC:  Normal affect   ASSESSMENT:    1. Dyslipidemia   2. Essential hypertension   3. Precordial chest pain   4. CKD (chronic kidney disease) stage 4, GFR 15-29 ml/min (HCC)    PLAN:    In order of problems listed above:  1. Precordial chest pain for suspicion for angina pectoris but stress test showed no evidence of ischemia.  We wanted to pursue cardiac catheterization but because of kidney dysfunction we elected to try medical therapy as per her wishes.  She is responding nicely to therapy and she is feeling well.  She does have episode of chest pain maybe once a week she took nitroglycerin only twice since I seen her last time.  She still favor conservative approach.  Therefore we will continue present management asked her to let me know if pain became more frequent or more prolonged. 2. Essential hypertension blood pressure well controlled, but she forgot to take her medication this morning.  I stressed importance of taking medication on a regular basis. 3. Dyslipidemia, will schedule her to have fasting  lipid profile   Medication Adjustments/Labs and Tests Ordered: Current medicines are reviewed at length with the patient today.  Concerns regarding medicines are outlined above.  Orders Placed This Encounter  Procedures  . Lipid Profile   Medication changes: No orders of the defined types were placed in this encounter.   Signed, Park Liter, MD, [facility] 09/25/2019 2:53 PM    [place]

## 2019-09-26 ENCOUNTER — Encounter: Payer: Self-pay | Admitting: Family Medicine

## 2019-09-26 LAB — LIPID PANEL
Chol/HDL Ratio: 2.7 ratio (ref 0.0–4.4)
Cholesterol, Total: 135 mg/dL (ref 100–199)
HDL: 50 mg/dL (ref >39)
LDL Chol Calc (NIH): 45 mg/dL (ref 0–99)
Triglycerides: 262 mg/dL — ABNORMAL HIGH (ref 0–149)
VLDL Cholesterol Cal: 40 mg/dL (ref 5–40)

## 2019-09-28 ENCOUNTER — Other Ambulatory Visit: Payer: Self-pay | Admitting: *Deleted

## 2019-09-28 DIAGNOSIS — Z794 Long term (current) use of insulin: Secondary | ICD-10-CM

## 2019-09-28 DIAGNOSIS — E119 Type 2 diabetes mellitus without complications: Secondary | ICD-10-CM

## 2019-09-28 DIAGNOSIS — R002 Palpitations: Secondary | ICD-10-CM

## 2019-09-28 MED ORDER — FREESTYLE LIBRE 2 SENSOR MISC: 11 refills | Status: DC

## 2019-09-28 MED ORDER — FREESTYLE LIBRE 2 READER DEVI: 0 refills | Status: DC

## 2019-09-28 NOTE — Progress Notes (Signed)
Pt requested Rx for freestyle [place]

## 2019-10-16 ENCOUNTER — Encounter: Payer: Self-pay | Admitting: Family Medicine

## 2019-10-16 ENCOUNTER — Telehealth: Payer: Self-pay | Admitting: *Deleted

## 2019-10-16 DIAGNOSIS — E1129 Type 2 diabetes mellitus with other diabetic kidney complication: Secondary | ICD-10-CM | POA: Insufficient documentation

## 2019-10-16 DIAGNOSIS — IMO0002 Reserved for concepts with insufficient information to code with codable children: Secondary | ICD-10-CM | POA: Insufficient documentation

## 2019-10-16 DIAGNOSIS — E1165 Type 2 diabetes mellitus with hyperglycemia: Secondary | ICD-10-CM | POA: Insufficient documentation

## 2019-10-16 DIAGNOSIS — E1122 Type 2 diabetes mellitus with diabetic chronic kidney disease: Secondary | ICD-10-CM | POA: Insufficient documentation

## 2019-10-16 HISTORY — DX: Reserved for concepts with insufficient information to code with codable children: IMO0002

## 2019-10-16 HISTORY — DX: Type 2 diabetes mellitus with hyperglycemia: E11.29

## 2019-10-16 NOTE — Telephone Encounter (Signed)
Form for freestyle faxed confirmation received.

## 2019-10-20 ENCOUNTER — Other Ambulatory Visit: Payer: Self-pay | Admitting: Family Medicine

## 2019-10-30 ENCOUNTER — Encounter: Payer: Self-pay | Admitting: Family Medicine

## 2019-10-30 ENCOUNTER — Ambulatory Visit (INDEPENDENT_AMBULATORY_CARE_PROVIDER_SITE_OTHER): Payer: Medicare Other | Admitting: Family Medicine

## 2019-10-30 VITALS — BP 115/57 | HR 84 | Ht 64.0 in | Wt 164.0 lb

## 2019-10-30 DIAGNOSIS — I10 Essential (primary) hypertension: Secondary | ICD-10-CM | POA: Diagnosis not present

## 2019-10-30 DIAGNOSIS — R197 Diarrhea, unspecified: Secondary | ICD-10-CM

## 2019-10-30 DIAGNOSIS — E1165 Type 2 diabetes mellitus with hyperglycemia: Secondary | ICD-10-CM

## 2019-10-30 DIAGNOSIS — E119 Type 2 diabetes mellitus without complications: Secondary | ICD-10-CM

## 2019-10-30 DIAGNOSIS — M1A0790 Idiopathic chronic gout, unspecified ankle and foot, without tophus (tophi): Secondary | ICD-10-CM

## 2019-10-30 DIAGNOSIS — E1129 Type 2 diabetes mellitus with other diabetic kidney complication: Secondary | ICD-10-CM | POA: Diagnosis not present

## 2019-10-30 DIAGNOSIS — IMO0002 Reserved for concepts with insufficient information to code with codable children: Secondary | ICD-10-CM

## 2019-10-30 DIAGNOSIS — Z794 Long term (current) use of insulin: Secondary | ICD-10-CM | POA: Diagnosis not present

## 2019-10-30 LAB — POCT GLYCOSYLATED HEMOGLOBIN (HGB A1C): Hemoglobin A1C: 8.1 % — AB (ref 4.0–5.6)

## 2019-10-30 MED ORDER — FREESTYLE PRECISION NEO TEST VI STRP: ORAL_STRIP | 6 refills | Status: DC

## 2019-10-30 NOTE — Assessment & Plan Note (Signed)
Pressure actually looks fantastic today.

## 2019-10-30 NOTE — Assessment & Plan Note (Signed)
She did have a flare recently in her left knee after eating a little bit of peanut butter after hypoglycemic event.  She does follow with rheumatology for this.

## 2019-10-30 NOTE — Assessment & Plan Note (Addendum)
A1c improved today it is down to 8.1 which is great.  I am concerned that she still having some hypoglycemic events particularly at night.  She says it does wake her up and she feels bad when it happens.  She will usually drink juice and sometimes have peanut butter.  Actually on a decrease her morning dose down to 30 units and just continue her evening dose at 15.  Also want a make sure that she is taking her evening injection before her evening meal.  Encouraged her to keep up the regular walking I think it is fantastic and will help with her overall cardiovascular health as well as reduce her glucose levels.  Recommend checking sugars before meals and when not feeling well.  Her regimen requires frequent adjustments based on glucose readings.

## 2019-10-30 NOTE — Progress Notes (Addendum)
Established Patient Office Visit  Subjective:  Patient ID: Barbara Yu, female    DOB: 06/16/1945  Age: 74 y.o. MRN: [id number]  CC:  Chief Complaint  Patient presents with  . Diabetes    HPI [name] presents for   Follow-up diabetes-she is doing okay on her own rent regimen.  Still struggling with eating regularly.  She says a lot of times she just does not feel like eating she really does not have much of an appetite.  She says she typically takes 34 units of Humulin 70/30 in the morning and 15 to 20 units in the evening.  She gives insulin up to 3 times a day.  Usually after she eats her evening meal.  She says she has been trying to walk a little bit more often at the mall.  She also wanted let me know that she been having diarrhea after she recently started an herbal tea for her kidney and liver health.  She stopped that she realizing that it was the medication causing it but still wants to use it occasionally.  We also discussed starting pravastatin at last office visit.  Past Medical History:  Diagnosis Date  . Anemia   . Anemia in stage 3 chronic kidney disease 02/03/2018  . Anemia in stage 4 chronic kidney disease ([place]) 02/03/2018  . AR (allergic rhinitis) 10/02/2016  . CKD (chronic kidney disease) stage 4, GFR 15-29 ml/min (HCC) 10/05/2016  . Diabetes mellitus without complication ([place])   . Dyslipidemia 07/14/2019  . 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  [place]   . Gastro-esophageal reflux disease without esophagitis 06/03/2012  . Gout 10/03/2016  . HTN (hypertension) 06/03/2012  . Hyperkalemia, diminished renal excretion 01/05/2019  . Hypertension   . Hypothyroidism 06/03/2012  . Iron deficiency anemia 10/02/2016  . Lumbar pseudoarthrosis 06/28/2016   Formatting of this note might be different from the original.  Added automatically from request for surgery [phone]  . Memory deficit 06/27/2018   MMSE 06/2017: 27/30. Pass is 28.   . Nonspecific abnormal electrocardiogram (ECG) (EKG) 07/14/2019  . Normal cardiac stress test 06/09/2012   at [facility] normal cardiac stress test (surgery preop); Dr Norman Clay  . Osteoarthritis 10/02/2016  . Pain in right hip 01/03/2012   Formatting of this note might be different from the original. Overview:  Right greater than left. MRI done 2013 shows right hip has partial posterior labral tear. Also has component of greater trochanteric bursitis and had corticosteroid injection with good relief in early July, 2012  . Peripheral neuropathy 10/02/2016  . Precordial chest pain 07/14/2019  . Primary osteoarthritis of both hips 01/03/2012   Right greater than left. MRI done 2013 shows right hip has partial posterior labral tear. Also has component of greater trochanteric bursitis and had corticosteroid injection with good relief in early July, 2012   . Thrombocytopenia, unspecified ([place]) 02/12/2013   Formatting of this note might be different from the original. 10/1 IMO update  . Type 2 diabetes mellitus with stage 4 chronic kidney disease ([place]) 06/03/2012  . Uncontrolled type 2 diabetes with renal manifestation ([place]) 10/16/2019  . Vitamin D deficiency     Past Surgical History:  Procedure Laterality Date  . BACK SURGERY      Family History  Problem Relation Age of Onset  . Diabetes Mother   . Diabetes Brother   .  Diabetes Son     Social History   Socioeconomic History  . Marital status: Widowed    Spouse name: Not on file  . Number of children: Not on file  . Years of education: Not on file  . Highest education level: Not on file  Occupational History  . Occupation: retired  Tobacco Use  . Smoking status: Former Smoker    Quit date: 09/08/1979    Years since quitting: 40.4  . Smokeless tobacco: Never Used  Substance and Sexual Activity  . Alcohol use: No  . Drug use: No   . Sexual activity: Never  Other Topics Concern  . Not on file  Social History Narrative  . Not on file   Social Determinants of Health   Financial Resource Strain:   . Difficulty of Paying Living Expenses: Not on file  Food Insecurity:   . Worried About Charity fundraiser in the Last Year: Not on file  . Ran Out of Food in the Last Year: Not on file  Transportation Needs:   . Lack of Transportation (Medical): Not on file  . Lack of Transportation (Non-Medical): Not on file  Physical Activity:   . Days of Exercise per Week: Not on file  . Minutes of Exercise per Session: Not on file  Stress:   . Feeling of Stress : Not on file  Social Connections:   . Frequency of Communication with Friends and Family: Not on file  . Frequency of Social Gatherings with Friends and Family: Not on file  . Attends Religious Services: Not on file  . Active Member of Clubs or Organizations: Not on file  . Attends Archivist Meetings: Not on file  . Marital Status: Not on file  Intimate Partner Violence:   . Fear of Current or Ex-Partner: Not on file  . Emotionally Abused: Not on file  . Physically Abused: Not on file  . Sexually Abused: Not on file    Outpatient Medications Prior to Visit  Medication Sig Dispense Refill  . nitroGLYCERIN (NITROSTAT) 0.4 MG SL tablet Place under the tongue.    Marland Kitchen acetaminophen (TYLENOL) 325 MG tablet Take 650 mg by mouth as needed.    Marland Kitchen albuterol (VENTOLIN HFA) 108 (90 Base) MCG/ACT inhaler Inhale 2 puffs into the lungs every 6 (six) hours as needed. 54 g 4  . amLODipine (NORVASC) 5 MG tablet Take 1 tablet (5 mg total) by mouth daily. 90 tablet 3  . colchicine 0.6 MG tablet Take 1 tablet (0.6 mg total) by mouth daily as needed. 1 tablet 0  . Continuous Blood Gluc Receiver (FREESTYLE LIBRE 2 READER) DEVI For checking blood sugars.Dx:E11.9 1 each 0  . Continuous Blood Gluc Sensor (FREESTYLE LIBRE 2 SENSOR) MISC 14 day sensor. Dx:E11.9 6 each 11  .  diazepam (VALIUM) 10 MG tablet Take 10 mg by mouth as needed.    . Ferrous Sulfate (IRON) 325 (65 Fe) MG TABS Take 1 tablet by mouth 3 (three) times daily.    Marland Kitchen glipiZIDE (GLUCOTROL) 5 MG tablet Take 5 mg by mouth 2 (two) times daily.    Marland Kitchen glucose blood (FREESTYLE LITE) test strip For testing up to 4 times daily. DX: E11.9 400 each 4  . HUMULIN 70/30 KWIKPEN (70-30) 100 UNIT/ML KwikPen INJECT 34 UNITS IN THE MORNING AND 32 UNITS IN THE EVENING 45 mL 4  . lactulose (CHRONULAC) 10 GM/15ML solution Take 15-30 mLs (10-20 g total) by mouth 2 (two) times daily as needed  for mild constipation. 1892 mL 3  . levothyroxine (SYNTHROID) 50 MCG tablet TAKE 1 TABLET TWO TIMES A WEEK. ON SUNDAY AND WEDNESDAY. ALTERNATING WITH 75 MCG THE OTHER 5 DAYS 90 tablet 3  . levothyroxine (SYNTHROID) 75 MCG tablet Take 1 tablet (75 mcg total) by mouth daily. 90 tablet 3  . liraglutide (VICTOZA) 18 MG/3ML SOPN Inject 0.2 mLs (1.2 mg total) into the skin daily. 18 mL 3  . metoprolol tartrate (LOPRESSOR) 25 MG tablet Take 1 tablet (25 mg total) by mouth 2 (two) times daily. 180 tablet 3  . montelukast (SINGULAIR) 10 MG tablet Take 1 tablet (10 mg total) by mouth at bedtime. 90 tablet 3  . NARCAN 4 MG/0.1ML LIQD nasal spray kit CALL 911. ADMINISTER A SINGLE SPRAY OF NARCAN IN ONE NOSTRIL. REPEAT EVERY 3 MINUTES AS NEEDED IF NO OR MINIMAL RESPONSE.  0  . Oxycodone HCl 10 MG TABS     . polyethylene glycol (MIRALAX / GLYCOLAX) packet Take 17 g by mouth as needed.    . pravastatin (PRAVACHOL) 20 MG tablet Take 1 tablet (20 mg total) by mouth every evening. 90 tablet 3  . terconazole (TERAZOL 7) 0.4 % vaginal cream Place 1 applicator vaginally at bedtime. 45 g 1  . tolterodine (DETROL LA) 4 MG 24 hr capsule Take 1 capsule (4 mg total) by mouth daily. 90 capsule 4  . Triamcinolone Acetonide (TRIAMCINOLONE 0.1 % CREAM : EUCERIN) CREA Apply 1 application topically 2 (two) times daily as needed for itching. 1 jar 1 each 1  . Vitamin D,  Ergocalciferol, (DRISDOL) 1.25 MG (50000 UT) CAPS capsule TAKE 1 CAPSULE ONCE A WEEK 30 capsule 4  . XTAMPZA ER 18 MG C12A Take 1 capsule by mouth 2 (two) times daily.    . isosorbide mononitrate (IMDUR) 30 MG 24 hr tablet Take 1 tablet (30 mg total) by mouth daily. 90 tablet 3  . quinapril (ACCUPRIL) 20 MG tablet TAKE 1 TABLET AT BEDTIME 90 tablet 0   No facility-administered medications prior to visit.    Allergies  Allergen Reactions  . Amoxicillin Diarrhea and Itching    Other reaction(s): Confusion (intolerance)  . Atorvastatin Other (See Comments)    Burning skin sensation  . Hydrochlorothiazide     Other reaction(s): Other Chronic renal failure  . Nsaids Other (See Comments)    Peptic ulcer disease  . Tramadol Nausea And Vomiting    Other reaction(s): GI Upset (intolerance)  . Allopurinol Other (See Comments)    Makes her stomach hurt  . Morphine Other (See Comments)    Hallucinations  . Omeprazole Diarrhea  . Pepcid [Famotidine] Other (See Comments)    Cause her to become gasy  . Trulicity [Dulaglutide] Other (See Comments)  . Ciprofloxacin     Other reaction(s): GI Upset (intolerance) Other reaction(s): Abdominal Pain Other reaction(s): Abdominal Pain  . Gabapentin Nausea Only    Feels "stupid" and wt gain   . Pantoprazole Diarrhea    ROS Review of Systems    Objective:    Physical Exam Constitutional:      Appearance: She is well-developed.  HENT:     Head: Normocephalic and atraumatic.  Cardiovascular:     Rate and Rhythm: Normal rate and regular rhythm.     Heart sounds: Normal heart sounds.  Pulmonary:     Effort: Pulmonary effort is normal.     Breath sounds: Normal breath sounds.  Skin:    General: Skin is warm and dry.  Neurological:  Mental Status: She is alert and oriented to person, place, and time.  Psychiatric:        Behavior: Behavior normal.     BP (!) 115/57   Pulse 84   Ht 5' 4"  (1.626 m)   Wt 164 lb (74.4 kg)   SpO2  100%   BMI 28.15 kg/m  Wt Readings from Last 3 Encounters:  01/11/20 165 lb 0.6 oz (74.9 kg)  12/22/19 166 lb (75.3 kg)  10/30/19 164 lb (74.4 kg)     There are no preventive care reminders to display for this patient.  There are no preventive care reminders to display for this patient.  Lab Results  Component Value Date   TSH 0.98 06/26/2019   Lab Results  Component Value Date   WBC 7.9 07/14/2019   HGB 10.4 (L) 07/14/2019   HCT 33.6 (L) 07/14/2019   MCV 79 07/14/2019   PLT 136 (L) 07/14/2019   Lab Results  Component Value Date   NA 138 07/21/2019   K 5.0 07/21/2019   CO2 22 07/21/2019   GLUCOSE 184 (H) 07/21/2019   BUN 23 07/21/2019   CREATININE 1.81 (H) 07/21/2019   BILITOT 0.4 06/26/2019   ALKPHOS 57 11/28/2016   AST 80 (H) 06/26/2019   ALT 59 (H) 06/26/2019   PROT 7.6 06/26/2019   ALBUMIN 4.3 11/28/2016   CALCIUM 10.4 (H) 07/21/2019   Lab Results  Component Value Date   CHOL 135 09/25/2019   Lab Results  Component Value Date   HDL 50 09/25/2019   Lab Results  Component Value Date   LDLCALC 45 09/25/2019   Lab Results  Component Value Date   TRIG 262 (H) 09/25/2019   Lab Results  Component Value Date   CHOLHDL 2.7 09/25/2019   Lab Results  Component Value Date   HGBA1C 8.1 (A) 10/30/2019      Assessment & Plan:   Problem List Items Addressed This Visit      Cardiovascular and Mediastinum   HTN (hypertension)    Pressure actually looks fantastic today.      Relevant Medications   nitroGLYCERIN (NITROSTAT) 0.4 MG SL tablet     Endocrine   Uncontrolled type 2 diabetes with renal manifestation (HCC)    A1c improved today it is down to 8.1 which is great.  I am concerned that she still having some hypoglycemic events particularly at night.  She says it does wake her up and she feels bad when it happens.  She will usually drink juice and sometimes have peanut butter.  Actually on a decrease her morning dose down to 30 units and just  continue her evening dose at 15.  Also want a make sure that she is taking her evening injection before her evening meal.  Encouraged her to keep up the regular walking I think it is fantastic and will help with her overall cardiovascular health as well as reduce her glucose levels.  Recommend checking sugars before meals and when not feeling well.  Her regimen requires frequent adjustments based on glucose readings.        Relevant Medications   glucose blood (FREESTYLE PRECISION NEO TEST) test strip     Other   Gout    She did have a flare recently in her left knee after eating a little bit of peanut butter after hypoglycemic event.  She does follow with rheumatology for this.       Other Visit Diagnoses    Type 2  diabetes mellitus without complication, with long-term current use of insulin (HCC)    -  Primary   Relevant Orders   POCT glycosylated hemoglobin (Hb A1C) (Completed)   Diarrhea, unspecified type          Diarrhea-not exactly clear what is in the herbal tea just encouraged her to let me look at the ingredient list to make sure that it safe to take.  It sounds most like at some type of colon cleanse.  Symptoms seem to have resolved off of the tea.  See scanned glucose log.  Meds ordered this encounter  Medications  . glucose blood (FREESTYLE PRECISION NEO TEST) test strip    Sig: Dx. Diabetes. Test daily as needed.    Dispense:  100 each    Refill:  6    Follow-up: Return in about 3 months (around 01/30/2020) for Diabetes follow-up.    Beatrice Lecher, MD

## 2019-10-30 NOTE — Patient Instructions (Signed)
Decrease Humulin to 30 units in the morning before breakfast,   and stay at 15 units before evening meal.

## 2019-11-12 ENCOUNTER — Ambulatory Visit (INDEPENDENT_AMBULATORY_CARE_PROVIDER_SITE_OTHER): Payer: Medicare Other

## 2019-11-12 ENCOUNTER — Telehealth: Payer: Self-pay | Admitting: *Deleted

## 2019-11-12 DIAGNOSIS — R002 Palpitations: Secondary | ICD-10-CM | POA: Diagnosis not present

## 2019-11-12 NOTE — Telephone Encounter (Signed)
Information for [place] faxed to [facility] DME supplier. Confirmation received .

## 2019-11-25 ENCOUNTER — Telehealth: Payer: Self-pay | Admitting: *Deleted

## 2019-12-01 NOTE — Telephone Encounter (Signed)
Information faxed

## 2019-12-22 ENCOUNTER — Other Ambulatory Visit: Payer: Self-pay

## 2019-12-22 ENCOUNTER — Encounter: Payer: Self-pay | Admitting: Family Medicine

## 2019-12-22 ENCOUNTER — Ambulatory Visit (INDEPENDENT_AMBULATORY_CARE_PROVIDER_SITE_OTHER): Payer: Medicare Other | Admitting: Family Medicine

## 2019-12-22 VITALS — BP 124/64 | HR 85 | Ht 64.0 in | Wt 166.0 lb

## 2019-12-22 DIAGNOSIS — M7918 Myalgia, other site: Secondary | ICD-10-CM

## 2019-12-22 DIAGNOSIS — M549 Dorsalgia, unspecified: Secondary | ICD-10-CM

## 2019-12-22 MED ORDER — PREDNISONE 20 MG PO TABS: 40.0000 mg | ORAL_TABLET | Freq: Every day | ORAL | 0 refills | Status: DC

## 2019-12-22 NOTE — Progress Notes (Signed)
Established Patient Office Visit  Subjective:  Patient ID: Barbara Yu, female    DOB: 1946/04/07  Age: 74 y.o. MRN: [id number]  CC:  Chief Complaint  Patient presents with  . Shoulder Pain    R shoulder    HPI Barbara Yu presents for Pt reports that the pain started 4 days ago.  She says the pain started abruptly she had just gotten up around 11 AM and felt a sudden sharp shooting electrical pain in her right posterior shoulder it radiated down to her right mid buttock area.  She said it lasted maybe 30 to 40 seconds and then started to ease off.  Then later it occurred again when she was trying to reach into a cabinet so she was reaching up with her right arm and again felt the sensation recur.  It has happened several times since then no other specific triggers or trauma or injury.  She is not been doing any repetitive motions or heavy lifting.Pain is 10/10, stabbing, comes and goes no matter what she does sit,stand walking the pain will come. She has been using a heating pad, arthritis strength tylenol and her regular pain medication and none of these have really helped her.   Does not radiate down into her legs or around into her chest.  Did bring in some imaging including x-ray of the thoracic spine and lumbar spine that were performed in 2018.  She does not have any associated nausea or abdominal pain with this.  No recent chest pain.  Eating or not eating does not seem to aggravate or trigger her symptoms.  Change in bowels recently.  History of cholecystectomy.  Past Medical History:  Diagnosis Date  . Anemia   . Diabetes mellitus without complication ([place])   . Hypertension   . Normal cardiac stress test 06/09/2012   at [facility] normal cardiac stress test (surgery preop); Dr Norman Clay  . Vitamin D deficiency     Past Surgical History:  Procedure Laterality Date  . BACK SURGERY      History reviewed. No pertinent family history.  Social History   Socioeconomic  History  . Marital status: Widowed    Spouse name: Not on file  . Number of children: Not on file  . Years of education: Not on file  . Highest education level: Not on file  Occupational History  . Occupation: retired  Tobacco Use  . Smoking status: Former Smoker    Quit date: 09/08/1979    Years since quitting: 40.3  . Smokeless tobacco: Never Used  Substance and Sexual Activity  . Alcohol use: No  . Drug use: No  . Sexual activity: Never  Other Topics Concern  . Not on file  Social History Narrative  . Not on file   Social Determinants of Health   Financial Resource Strain:   . Difficulty of Paying Living Expenses: Not on file  Food Insecurity:   . Worried About Charity fundraiser in the Last Year: Not on file  . Ran Out of Food in the Last Year: Not on file  Transportation Needs:   . Lack of Transportation (Medical): Not on file  . Lack of Transportation (Non-Medical): Not on file  Physical Activity:   . Days of Exercise per Week: Not on file  . Minutes of Exercise per Session: Not on file  Stress:   . Feeling of Stress : Not on file  Social Connections:   . Frequency of Communication with Friends and  Family: Not on file  . Frequency of Social Gatherings with Friends and Family: Not on file  . Attends Religious Services: Not on file  . Active Member of Clubs or Organizations: Not on file  . Attends Archivist Meetings: Not on file  . Marital Status: Not on file  Intimate Partner Violence:   . Fear of Current or Ex-Partner: Not on file  . Emotionally Abused: Not on file  . Physically Abused: Not on file  . Sexually Abused: Not on file    Outpatient Medications Prior to Visit  Medication Sig Dispense Refill  . acetaminophen (TYLENOL) 325 MG tablet Take 650 mg by mouth as needed.    Marland Kitchen albuterol (VENTOLIN HFA) 108 (90 Base) MCG/ACT inhaler Inhale 2 puffs into the lungs every 6 (six) hours as needed. 54 g 4  . amLODipine (NORVASC) 5 MG tablet Take 1  tablet (5 mg total) by mouth daily. 90 tablet 3  . colchicine 0.6 MG tablet Take 1 tablet (0.6 mg total) by mouth daily as needed. 1 tablet 0  . Continuous Blood Gluc Receiver (FREESTYLE LIBRE 2 READER) DEVI For checking blood sugars.Dx:E11.9 1 each 0  . Continuous Blood Gluc Sensor (FREESTYLE LIBRE 2 SENSOR) MISC 14 day sensor. Dx:E11.9 6 each 11  . diazepam (VALIUM) 10 MG tablet Take 10 mg by mouth as needed.    . Ferrous Sulfate (IRON) 325 (65 Fe) MG TABS Take 1 tablet by mouth 3 (three) times daily.    Marland Kitchen glipiZIDE (GLUCOTROL) 5 MG tablet Take 5 mg by mouth 2 (two) times daily.    Marland Kitchen glucose blood (FREESTYLE LITE) test strip For testing up to 4 times daily. DX: E11.9 400 each 4  . glucose blood (FREESTYLE PRECISION NEO TEST) test strip Dx. Diabetes. Test daily as needed. 100 each 6  . HUMULIN 70/30 KWIKPEN (70-30) 100 UNIT/ML KwikPen INJECT 34 UNITS IN THE MORNING AND 32 UNITS IN THE EVENING 45 mL 4  . isosorbide mononitrate (IMDUR) 30 MG 24 hr tablet Take 1 tablet (30 mg total) by mouth daily. 90 tablet 3  . lactulose (CHRONULAC) 10 GM/15ML solution Take 15-30 mLs (10-20 g total) by mouth 2 (two) times daily as needed for mild constipation. 1892 mL 3  . levothyroxine (SYNTHROID) 50 MCG tablet TAKE 1 TABLET TWO TIMES A WEEK. ON SUNDAY AND WEDNESDAY. ALTERNATING WITH 75 MCG THE OTHER 5 DAYS 90 tablet 3  . levothyroxine (SYNTHROID) 75 MCG tablet Take 1 tablet (75 mcg total) by mouth daily. 90 tablet 3  . liraglutide (VICTOZA) 18 MG/3ML SOPN Inject 0.2 mLs (1.2 mg total) into the skin daily. 18 mL 3  . metoprolol tartrate (LOPRESSOR) 25 MG tablet Take 1 tablet (25 mg total) by mouth 2 (two) times daily. 180 tablet 3  . montelukast (SINGULAIR) 10 MG tablet Take 1 tablet (10 mg total) by mouth at bedtime. 90 tablet 3  . NARCAN 4 MG/0.1ML LIQD nasal spray kit CALL 911. ADMINISTER A SINGLE SPRAY OF NARCAN IN ONE NOSTRIL. REPEAT EVERY 3 MINUTES AS NEEDED IF NO OR MINIMAL RESPONSE.  0  . nitroGLYCERIN  (NITROSTAT) 0.4 MG SL tablet Place under the tongue.    Marland Kitchen Oxycodone HCl 10 MG TABS     . polyethylene glycol (MIRALAX / GLYCOLAX) packet Take 17 g by mouth as needed.    . pravastatin (PRAVACHOL) 20 MG tablet Take 1 tablet (20 mg total) by mouth every evening. 90 tablet 3  . quinapril (ACCUPRIL) 20 MG tablet TAKE  1 TABLET AT BEDTIME 90 tablet 0  . terconazole (TERAZOL 7) 0.4 % vaginal cream Place 1 applicator vaginally at bedtime. 45 g 1  . tolterodine (DETROL LA) 4 MG 24 hr capsule Take 1 capsule (4 mg total) by mouth daily. 90 capsule 4  . Triamcinolone Acetonide (TRIAMCINOLONE 0.1 % CREAM : EUCERIN) CREA Apply 1 application topically 2 (two) times daily as needed for itching. 1 jar 1 each 1  . Vitamin D, Ergocalciferol, (DRISDOL) 1.25 MG (50000 UT) CAPS capsule TAKE 1 CAPSULE ONCE A WEEK 30 capsule 4  . XTAMPZA ER 18 MG C12A Take 1 capsule by mouth 2 (two) times daily.     No facility-administered medications prior to visit.    Allergies  Allergen Reactions  . Amoxicillin Diarrhea and Itching    Other reaction(s): Confusion (intolerance)  . Atorvastatin Other (See Comments)    Burning skin sensation  . Hydrochlorothiazide     Other reaction(s): Other Chronic renal failure  . Nsaids Other (See Comments)    Peptic ulcer disease  . Tramadol Nausea And Vomiting    Other reaction(s): GI Upset (intolerance)  . Allopurinol Other (See Comments)    Makes her stomach hurt  . Morphine Other (See Comments)    Hallucinations  . Omeprazole Diarrhea  . Pepcid [Famotidine] Other (See Comments)    Cause her to become gasy  . Trulicity [Dulaglutide] Other (See Comments)  . Ciprofloxacin     Other reaction(s): GI Upset (intolerance) Other reaction(s): Abdominal Pain Other reaction(s): Abdominal Pain  . Gabapentin Nausea Only    Feels "stupid" and wt gain   . Pantoprazole Diarrhea    ROS Review of Systems    Objective:    Physical Exam Constitutional:      Appearance: She is  well-developed.  HENT:     Head: Normocephalic and atraumatic.  Cardiovascular:     Rate and Rhythm: Normal rate and regular rhythm.     Heart sounds: Normal heart sounds.  Pulmonary:     Effort: Pulmonary effort is normal.     Breath sounds: Normal breath sounds.  Musculoskeletal:     Comments: Apical spine with normal flexion and extension slightly decreased rotation right and left but symmetric.  Normal side bending right and left.  Shoulders with normal range of motion.  Nontender over the cervical thoracic or lumbar spine.  Slightly tender between the scapula and the mid thoracic spine.  Nontender over the upper trapezius or shoulder joint itself.  Normal external rotation of the shoulder.  Tenderness over the left buttock area.  Hip, knee, ankle strength is 4+ out of 5 but symmetric.  Patellar reflexes trace bilaterally  Skin:    General: Skin is warm and dry.  Neurological:     Mental Status: She is alert and oriented to person, place, and time.  Psychiatric:        Behavior: Behavior normal.     BP 124/64   Pulse 85   Ht 5' 4"  (1.626 m)   Wt 166 lb (75.3 kg)   SpO2 99%   BMI 28.49 kg/m  Wt Readings from Last 3 Encounters:  12/22/19 166 lb (75.3 kg)  10/30/19 164 lb (74.4 kg)  09/25/19 165 lb (74.8 kg)     There are no preventive care reminders to display for this patient.  There are no preventive care reminders to display for this patient.  Lab Results  Component Value Date   TSH 0.98 06/26/2019   Lab Results  Component Value Date   WBC 7.9 07/14/2019   HGB 10.4 (L) 07/14/2019   HCT 33.6 (L) 07/14/2019   MCV 79 07/14/2019   PLT 136 (L) 07/14/2019   Lab Results  Component Value Date   NA 138 07/21/2019   K 5.0 07/21/2019   CO2 22 07/21/2019   GLUCOSE 184 (H) 07/21/2019   BUN 23 07/21/2019   CREATININE 1.81 (H) 07/21/2019   BILITOT 0.4 06/26/2019   ALKPHOS 57 11/28/2016   AST 80 (H) 06/26/2019   ALT 59 (H) 06/26/2019   PROT 7.6 06/26/2019   ALBUMIN  4.3 11/28/2016   CALCIUM 10.4 (H) 07/21/2019   Lab Results  Component Value Date   CHOL 135 09/25/2019   Lab Results  Component Value Date   HDL 50 09/25/2019   Lab Results  Component Value Date   LDLCALC 45 09/25/2019   Lab Results  Component Value Date   TRIG 262 (H) 09/25/2019   Lab Results  Component Value Date   CHOLHDL 2.7 09/25/2019   Lab Results  Component Value Date   HGBA1C 8.1 (A) 10/30/2019      Assessment & Plan:   Problem List Items Addressed This Visit    None    Visit Diagnoses    Upper back pain on right side    -  Primary   Relevant Medications   predniSONE (DELTASONE) 20 MG tablet   Right buttock pain         Sitting in the room she experienced some stabbing pain in her right upper chest almost towards the axilla after we did some range of motion exercises but not during.  Upper back pain with what sounds like radicular pain.  Recommend a trial of prednisone since her regular medications do not seem to be helping.  Edema seems to be an electrical shooting sensation.  Home PT exercises given to do on her own at home.  Call if not proving over the next week.  Right buttock pain-mild tenderness on exam.  Strength assisted right symmetric.  Negative straight leg raise bilaterally no radiation down the leg.  Recommend trial of prednisone.  Did warn that it will increase her blood sugars temporarily   Meds ordered this encounter  Medications  . predniSONE (DELTASONE) 20 MG tablet    Sig: Take 2 tablets (40 mg total) by mouth daily with breakfast.    Dispense:  10 tablet    Refill:  0    Follow-up: No follow-ups on file.    Beatrice Lecher, MD

## 2019-12-22 NOTE — Progress Notes (Signed)
Pt reports that the pain started 4 days ago. She stated that it starts in her R shoulder and goes down into her R buttock. Pain is 10/10, stabbing, comes and goes no matter what she does sit,stand walking the pain will come. She has been using a heating pad, arthritis strength tylenol and her regular pain medication and none of these have really helped her.   She also brought in CDs from previous imaging

## 2019-12-23 DIAGNOSIS — R079 Chest pain, unspecified: Secondary | ICD-10-CM

## 2019-12-25 ENCOUNTER — Telehealth: Payer: Self-pay | Admitting: Cardiology

## 2019-12-25 ENCOUNTER — Telehealth: Payer: Self-pay | Admitting: Family Medicine

## 2019-12-25 DIAGNOSIS — R778 Other specified abnormalities of plasma proteins: Secondary | ICD-10-CM

## 2019-12-25 DIAGNOSIS — R079 Chest pain, unspecified: Secondary | ICD-10-CM

## 2019-12-25 LAB — TROPONIN T: Troponin T (Highly Sensitive): 26 ng/L (ref 0–14)

## 2019-12-25 NOTE — Telephone Encounter (Signed)
     Barbara Yu called, she just wanted to make sure Dr. Raliegh Ip is aware of elevated troponin of the pt.

## 2019-12-25 NOTE — Telephone Encounter (Signed)
Elevated troponin level came back today.  She is actually feeling well today but did have a little bit of chest pain last night after she could not find her pills for her dogs she had misplaced them.  But she is feeling better today.  Sent a message to Dr. Orlie Pollen .  Recommended repeating the troponin.  If elevating then will need to go to the emergency department.  If trending downward then she can follow-up with him next week.

## 2019-12-29 LAB — TROPONIN T: Troponin T (Highly Sensitive): 20 ng/L (ref 0–14)

## 2019-12-29 NOTE — Telephone Encounter (Signed)
Yes, I am aware of her troponin being elevated some only mild elevation in his flat meaning there is no significant changes.  Please schedule to have repeated echocardiogram to make sure there is no segmental wall motion abnormalities and ask if she having any chest pain

## 2019-12-30 NOTE — Telephone Encounter (Signed)
Called patient. Informed her that we will need a echocardiogram. She denies any chest pain. Working on getting patient scheduled. Will let her know when scheduled. No further questions.

## 2019-12-30 NOTE — Telephone Encounter (Signed)
Called patient informed he of echo appointment. Advised her to call back if she needs to reschedule she verbally understood no further questions.

## 2020-01-01 ENCOUNTER — Other Ambulatory Visit: Payer: Self-pay

## 2020-01-01 ENCOUNTER — Ambulatory Visit (HOSPITAL_BASED_OUTPATIENT_CLINIC_OR_DEPARTMENT_OTHER)
Admission: RE | Admit: 2020-01-01 | Discharge: 2020-01-01 | Disposition: A | Discharge status: Home / Self Care | Payer: Medicare Other | Source: Ambulatory Visit | Attending: Cardiology | Admitting: Cardiology

## 2020-01-01 DIAGNOSIS — R079 Chest pain, unspecified: Secondary | ICD-10-CM

## 2020-01-01 LAB — ECHOCARDIOGRAM COMPLETE
AR max vel: 2.61 cm2
AV Area VTI: 2.53 cm2
AV Area mean vel: 2.42 cm2
AV Mean grad: 3 mmHg
AV Peak grad: 6.5 mmHg
Ao pk vel: 1.27 m/s
Area-P 1/2: 3.69 cm2
Calc EF: 67.8 %
S' Lateral: 2.81 cm
Single Plane A2C EF: 72.3 %
Single Plane A4C EF: 66.7 %

## 2020-01-05 ENCOUNTER — Other Ambulatory Visit: Payer: Self-pay | Admitting: Physician Assistant

## 2020-01-08 ENCOUNTER — Other Ambulatory Visit: Payer: Self-pay

## 2020-01-08 DIAGNOSIS — E119 Type 2 diabetes mellitus without complications: Secondary | ICD-10-CM | POA: Insufficient documentation

## 2020-01-08 DIAGNOSIS — I10 Essential (primary) hypertension: Secondary | ICD-10-CM | POA: Insufficient documentation

## 2020-01-08 DIAGNOSIS — D649 Anemia, unspecified: Secondary | ICD-10-CM | POA: Insufficient documentation

## 2020-01-11 ENCOUNTER — Other Ambulatory Visit: Payer: Self-pay

## 2020-01-11 ENCOUNTER — Encounter: Payer: Self-pay | Admitting: Cardiology

## 2020-01-11 ENCOUNTER — Ambulatory Visit (INDEPENDENT_AMBULATORY_CARE_PROVIDER_SITE_OTHER): Payer: Medicare Other | Admitting: Cardiology

## 2020-01-11 VITALS — BP 134/64 | HR 88 | Ht 64.0 in | Wt 165.0 lb

## 2020-01-11 DIAGNOSIS — N184 Chronic kidney disease, stage 4 (severe): Secondary | ICD-10-CM

## 2020-01-11 DIAGNOSIS — I10 Essential (primary) hypertension: Secondary | ICD-10-CM

## 2020-01-11 DIAGNOSIS — I209 Angina pectoris, unspecified: Secondary | ICD-10-CM | POA: Insufficient documentation

## 2020-01-11 HISTORY — DX: Angina pectoris, unspecified: I20.9

## 2020-01-11 MED ORDER — ISOSORBIDE MONONITRATE ER 60 MG PO TB24: 60.0000 mg | ORAL_TABLET | Freq: Every day | ORAL | 1 refills | Status: DC

## 2020-01-11 NOTE — Addendum Note (Signed)
Addended by: Senaida Ores on: 01/11/2020 02:34 PM   Modules accepted: Orders

## 2020-01-11 NOTE — Progress Notes (Signed)
Cardiology Office Note:    Date:  01/11/2020   ID:  Barbara Yu, DOB 1946-04-02, MRN [id number]  PCP:  [name], MD  Cardiologist:  [name], MD    Referring MD: [name], *   No chief complaint on file. I had episode of chest pain 2 weeks ago  History of Present Illness:    Barbara Yu is a 74 y.o. female with past medical history significant for essential hypertension, chronic kidney disease, dyslipidemia.  She was referred to [place] because of chest pain which look very suspicious for angina pectoris.  Stress test was done which showed no evidence of ischemia however she does describe quite typical angina pectoris.  About 2 weeks ago her dog died and she was very emotional developed tightness in the chest.  She did not take nitroglycerin she took some rest relax and pain went away.  Also anytime she tried to push herself while walking or walking uphill she will developed a sensation.  Initially I give her Imdur 30 mg which very good response in spite of that she still does have some breakthrough angina pectoris.  We talked in length about the situation today and I told her again that in my opinion probably cardiac catheterization will be the best way to assess the situation.  Basically what I am trying to do is to rule out significant triple-vessel disease.  However, she does have some kidney dysfunction she is afraid which she will have cardiac catheterization she may end up having kidney injury she does not want to pursue cardiac catheterization at this stage.  She wants to augment her medical therapy.  Also recently she started complaining of having more pain with her back and was also some potential issue with surgery on her back.  Past Medical History:  Diagnosis Date  . Anemia   . Anemia in stage 3 chronic kidney disease 02/03/2018  . Anemia in stage 4 chronic kidney disease ([place]) 02/03/2018  . AR (allergic rhinitis) 10/02/2016  . CKD (chronic  kidney disease) stage 4, GFR 15-29 ml/min (HCC) 10/05/2016  . Diabetes mellitus without complication ([place])   . Dyslipidemia 07/14/2019  . 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  [place]   . Gastro-esophageal reflux disease without esophagitis 06/03/2012  . Gout 10/03/2016  . HTN (hypertension) 06/03/2012  . Hyperkalemia, diminished renal excretion 01/05/2019  . Hypertension   . Hypothyroidism 06/03/2012  . Iron deficiency anemia 10/02/2016  . Lumbar pseudoarthrosis 06/28/2016   Formatting of this note might be different from the original. Added automatically from request for surgery [id number]  . Memory deficit 06/27/2018   MMSE 06/2017: 27/30. Pass is 28.   . Nonspecific abnormal electrocardiogram (ECG) (EKG) 07/14/2019  . Normal cardiac stress test 06/09/2012   at [facility] normal cardiac stress test (surgery preop); Dr Norman Clay  . Osteoarthritis 10/02/2016  . Pain in right hip 01/03/2012   Formatting of this note might be different from the original. Overview:  Right greater than left. MRI done 2013 shows right hip has partial posterior labral tear. Also has component of greater trochanteric bursitis and had corticosteroid injection with good relief in early July, 2012  . Peripheral neuropathy 10/02/2016  . Precordial chest pain 07/14/2019  . Primary osteoarthritis of both hips 01/03/2012   Right greater than left. MRI done 2013 shows right hip has partial posterior labral  tear. Also has component of greater trochanteric bursitis and had corticosteroid injection with good relief in early July, 2012   . Thrombocytopenia, unspecified ([place]) 02/12/2013   Formatting of this note might be different from the original. 10/1 IMO update  . Type 2 diabetes mellitus with stage 4 chronic kidney disease ([place]) 06/03/2012  . Uncontrolled type 2 diabetes with renal manifestation ([place])  10/16/2019  . Vitamin D deficiency     Past Surgical History:  Procedure Laterality Date  . BACK SURGERY      Current Medications: Current Meds  Medication Sig  . ACCUPRIL 20 MG tablet TAKE 1 TABLET AT BEDTIME  . acetaminophen (TYLENOL) 325 MG tablet Take 650 mg by mouth as needed.  Marland Kitchen albuterol (VENTOLIN HFA) 108 (90 Base) MCG/ACT inhaler Inhale 2 puffs into the lungs every 6 (six) hours as needed.  Marland Kitchen amLODipine (NORVASC) 5 MG tablet Take 1 tablet (5 mg total) by mouth daily.  . colchicine 0.6 MG tablet Take 1 tablet (0.6 mg total) by mouth daily as needed.  . Continuous Blood Gluc Receiver (FREESTYLE LIBRE 2 READER) DEVI For checking blood sugars.Dx:E11.9  . Continuous Blood Gluc Sensor (FREESTYLE LIBRE 2 SENSOR) MISC 14 day sensor. Dx:E11.9  . diazepam (VALIUM) 10 MG tablet Take 10 mg by mouth as needed.  . Ferrous Sulfate (IRON) 325 (65 Fe) MG TABS Take 1 tablet by mouth 3 (three) times daily.  Marland Kitchen glipiZIDE (GLUCOTROL) 5 MG tablet Take 5 mg by mouth 2 (two) times daily.  Marland Kitchen glucose blood (FREESTYLE LITE) test strip For testing up to 4 times daily. DX: E11.9  . glucose blood (FREESTYLE PRECISION NEO TEST) test strip Dx. Diabetes. Test daily as needed.  Marland Kitchen HUMULIN 70/30 KWIKPEN (70-30) 100 UNIT/ML KwikPen INJECT 34 UNITS IN THE MORNING AND 32 UNITS IN THE EVENING  . lactulose (CHRONULAC) 10 GM/15ML solution Take 15-30 mLs (10-20 g total) by mouth 2 (two) times daily as needed for mild constipation.  Marland Kitchen levothyroxine (SYNTHROID) 50 MCG tablet TAKE 1 TABLET TWO TIMES A WEEK. ON SUNDAY AND WEDNESDAY. ALTERNATING WITH 75 MCG THE OTHER 5 DAYS  . levothyroxine (SYNTHROID) 75 MCG tablet Take 1 tablet (75 mcg total) by mouth daily.  Marland Kitchen liraglutide (VICTOZA) 18 MG/3ML SOPN Inject 0.2 mLs (1.2 mg total) into the skin daily.  Marland Kitchen NARCAN 4 MG/0.1ML LIQD nasal spray kit CALL 911. ADMINISTER A SINGLE SPRAY OF NARCAN IN ONE NOSTRIL. REPEAT EVERY 3 MINUTES AS NEEDED IF NO OR MINIMAL RESPONSE.  . nitroGLYCERIN  (NITROSTAT) 0.4 MG SL tablet Place under the tongue.  Marland Kitchen Oxycodone HCl 10 MG TABS   . polyethylene glycol (MIRALAX / GLYCOLAX) packet Take 17 g by mouth as needed.  Marland Kitchen terconazole (TERAZOL 7) 0.4 % vaginal cream Place 1 applicator vaginally at bedtime.  . tolterodine (DETROL LA) 4 MG 24 hr capsule Take 1 capsule (4 mg total) by mouth daily.  . Triamcinolone Acetonide (TRIAMCINOLONE 0.1 % CREAM : EUCERIN) CREA Apply 1 application topically 2 (two) times daily as needed for itching. 1 jar  . Vitamin D, Ergocalciferol, (DRISDOL) 1.25 MG (50000 UT) CAPS capsule TAKE 1 CAPSULE ONCE A WEEK  . XTAMPZA ER 18 MG C12A Take 1 capsule by mouth 2 (two) times daily.     Allergies:   Amoxicillin, Atorvastatin, Hydrochlorothiazide, Nsaids, Tramadol, Allopurinol, Morphine, Omeprazole, Pepcid [famotidine], Trulicity [dulaglutide], Ciprofloxacin, Gabapentin, and Pantoprazole   Social History   Socioeconomic History  . Marital status: Widowed    Spouse name: Not on file  .  Number of children: Not on file  . Years of education: Not on file  . Highest education level: Not on file  Occupational History  . Occupation: retired  Tobacco Use  . Smoking status: Former Smoker    Quit date: 09/08/1979    Years since quitting: 40.3  . Smokeless tobacco: Never Used  Substance and Sexual Activity  . Alcohol use: No  . Drug use: No  . Sexual activity: Never  Other Topics Concern  . Not on file  Social History Narrative  . Not on file   Social Determinants of Health   Financial Resource Strain:   . Difficulty of Paying Living Expenses: Not on file  Food Insecurity:   . Worried About Charity fundraiser in the Last Year: Not on file  . Ran Out of Food in the Last Year: Not on file  Transportation Needs:   . Lack of Transportation (Medical): Not on file  . Lack of Transportation (Non-Medical): Not on file  Physical Activity:   . Days of Exercise per Week: Not on file  . Minutes of Exercise per Session: Not  on file  Stress:   . Feeling of Stress : Not on file  Social Connections:   . Frequency of Communication with Friends and Family: Not on file  . Frequency of Social Gatherings with Friends and Family: Not on file  . Attends Religious Services: Not on file  . Active Member of Clubs or Organizations: Not on file  . Attends Archivist Meetings: Not on file  . Marital Status: Not on file     Family History: The patient's family history includes Diabetes in her brother, mother, and son. ROS:   Please see the history of present illness.    All 14 point review of systems negative except as described per history of present illness  EKGs/Labs/Other Studies Reviewed:      Recent Labs: 06/26/2019: ALT 59; TSH 0.98 07/14/2019: Hemoglobin 10.4; Platelets 136 07/21/2019: BUN 23; Creatinine, Ser 1.81; Potassium 5.0; Sodium 138  Recent Lipid Panel    Component Value Date/Time   CHOL 135 09/25/2019 1507   TRIG 262 (H) 09/25/2019 1507   HDL 50 09/25/2019 1507   CHOLHDL 2.7 09/25/2019 1507   CHOLHDL 3.3 02/23/2019 1356   LDLCALC 45 09/25/2019 1507   LDLCALC 72 02/23/2019 1356    Physical Exam:    VS:  BP 134/64   Pulse 88   Ht 5' 4"  (1.626 m)   Wt 165 lb 0.6 oz (74.9 kg)   SpO2 99%   BMI 28.33 kg/m     Wt Readings from Last 3 Encounters:  01/11/20 165 lb 0.6 oz (74.9 kg)  12/22/19 166 lb (75.3 kg)  10/30/19 164 lb (74.4 kg)     GEN:  Well nourished, well developed in no acute distress HEENT: Normal NECK: No JVD; No carotid bruits LYMPHATICS: No lymphadenopathy CARDIAC: RRR, no murmurs, no rubs, no gallops RESPIRATORY:  Clear to auscultation without rales, wheezing or rhonchi  ABDOMEN: Soft, non-tender, non-distended MUSCULOSKELETAL:  No edema; No deformity  SKIN: Warm and dry LOWER EXTREMITIES: no swelling NEUROLOGIC:  Alert and oriented x 3 PSYCHIATRIC:  Normal affect   ASSESSMENT:    1. Essential hypertension   2. Angina pectoris ([place])   3. CKD (chronic  kidney disease) stage 4, GFR 15-29 ml/min (HCC)    PLAN:    In order of problems listed above:  1. Angina pectoris.  She described episode of chest  tightness with emotions of relieved by rest as well as exercise relieved by rest.  Not accelerated pattern just stable.  I put her on long-acting nitrate with good response out today I willing to increase the dose of those medications she is already on beta-blocker as well as calcium channel blocker will double the dose of Imdur from 30 mg to 60 mg daily.  Again I discussed the issue of chronic catheterization she does not want to pursue this right now.  We did troponin I which is minimally elevated I suspect that is because of some kidney dysfunction not much delta.  Will repeat EKG today.  Told if she has more chest pain she need to take nitroglycerin go to the emergency room. 2. Essential hypertension: Blood pressure seems to be controlled today we will continue present management. 3. Dyslipidemia: She is on pravastatin 20 only, I did review her K PN which show me her LDL of 45 and HDL 58 this is from 09/25/2019.  Therefore, we will continue present management.   Medication Adjustments/Labs and Tests Ordered: Current medicines are reviewed at length with the patient today.  Concerns regarding medicines are outlined above.  No orders of the defined types were placed in this encounter.  Medication changes: No orders of the defined types were placed in this encounter.   Signed, Park Liter, MD, [facility] 01/11/2020 2:13 PM    [place]

## 2020-01-11 NOTE — Patient Instructions (Signed)
Medication Instructions:  Your physician has recommended you make the following change in your medication:   INCREASE: Imdur to 60 mg daily   *If you need a refill on your cardiac medications before your next appointment, please call your pharmacy*   Lab Work: None.  If you have labs (blood work) drawn today and your tests are completely normal, you will receive your results only by: Marland Kitchen MyChart Message (if you have MyChart) OR . A paper copy in the mail If you have any lab test that is abnormal or we need to change your treatment, we will call you to review the results.   Testing/Procedures: None.    Follow-Up: At [facility], you and your health needs are our priority.  As part of our continuing mission to provide you with exceptional heart care, we have created designated Provider Care Teams.  These Care Teams include your primary Cardiologist (physician) and Advanced Practice Providers (APPs -  Physician Assistants and Nurse Practitioners) who all work together to provide you with the care you need, when you need it.  We recommend signing up for the patient portal called "MyChart".  Sign up information is provided on this After Visit Summary.  MyChart is used to connect with patients for Virtual Visits (Telemedicine).  Patients are able to view lab/test results, encounter notes, upcoming appointments, etc.  Non-urgent messages can be sent to your provider as well.   To learn more about what you can do with MyChart, go to [email or web address].    Your next appointment:   3 month(s)  The format for your next appointment:   In Person  Provider:   [name], MD   Other Instructions

## 2020-01-19 ENCOUNTER — Telehealth: Payer: Self-pay | Admitting: *Deleted

## 2020-01-19 NOTE — Telephone Encounter (Signed)
Additional information faxed.confirmation received.

## 2020-01-20 ENCOUNTER — Telehealth: Payer: Self-pay | Admitting: *Deleted

## 2020-01-20 NOTE — Telephone Encounter (Signed)
Fax requesting additional information regarding pt's insulin treatment regimen.

## 2020-02-01 ENCOUNTER — Other Ambulatory Visit: Payer: Self-pay

## 2020-02-01 ENCOUNTER — Telehealth: Payer: Self-pay | Admitting: Family Medicine

## 2020-02-01 ENCOUNTER — Ambulatory Visit (INDEPENDENT_AMBULATORY_CARE_PROVIDER_SITE_OTHER): Payer: Medicare Other | Admitting: Family Medicine

## 2020-02-01 ENCOUNTER — Encounter: Payer: Self-pay | Admitting: Family Medicine

## 2020-02-01 VITALS — BP 119/54 | HR 78 | Ht 64.0 in | Wt 167.0 lb

## 2020-02-01 DIAGNOSIS — M961 Postlaminectomy syndrome, not elsewhere classified: Secondary | ICD-10-CM | POA: Diagnosis not present

## 2020-02-01 DIAGNOSIS — M5441 Lumbago with sciatica, right side: Secondary | ICD-10-CM

## 2020-02-01 DIAGNOSIS — N184 Chronic kidney disease, stage 4 (severe): Secondary | ICD-10-CM

## 2020-02-01 DIAGNOSIS — E1122 Type 2 diabetes mellitus with diabetic chronic kidney disease: Secondary | ICD-10-CM

## 2020-02-01 DIAGNOSIS — I209 Angina pectoris, unspecified: Secondary | ICD-10-CM

## 2020-02-01 DIAGNOSIS — Z794 Long term (current) use of insulin: Secondary | ICD-10-CM | POA: Diagnosis not present

## 2020-02-01 DIAGNOSIS — G8929 Other chronic pain: Secondary | ICD-10-CM

## 2020-02-01 DIAGNOSIS — E119 Type 2 diabetes mellitus without complications: Secondary | ICD-10-CM | POA: Diagnosis not present

## 2020-02-01 DIAGNOSIS — M5442 Lumbago with sciatica, left side: Secondary | ICD-10-CM

## 2020-02-01 LAB — POCT GLYCOSYLATED HEMOGLOBIN (HGB A1C): Hemoglobin A1C: 7.8 % — AB (ref 4.0–5.6)

## 2020-02-01 NOTE — Progress Notes (Signed)
Pt reports that she has been taking 36U of the humulin 70/30 in the morning and 25U in the evenings.  Her averages are as follows:  7:   175 14: 160 30: 168 90: 162  She would like to discuss getting an MRI of her back done

## 2020-02-01 NOTE — Progress Notes (Signed)
Established Patient Office Visit  Subjective:  Patient ID: Barbara Yu, female    DOB: November 03, 1945  Age: 74 y.o. MRN: [id number]  CC:  Chief Complaint  Patient presents with  . Diabetes    HPI Marvin Maenza presents for   Diabetes - no hypoglycemic events. No wounds or sores that are not healing well. No increased thirst or urination. Checking glucose at home. Taking medications as prescribed without any side effects.  doing 36 in AM 25 units.  When I saw her in July I had encouraged her to decrease her insulin to 30 and 15 but she had had a couple of corticosteroid injections as well as oral prednisone that caused her glucose levels to be elevated.  Her averages are as follows:  7:   175 14: 160 30: 168 90: 162     She would also like to get an update MRI of her spine.  She has been having more flares and the injections don;t seem to be lasting as long.  Has had a flare over the last 3 days.    Past Medical History:  Diagnosis Date  . Anemia   . Anemia in stage 3 chronic kidney disease ([place]) 02/03/2018  . 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 AR (allergic rhinitis) 10/02/2016  . CKD (chronic kidney disease) stage 4, GFR 15-29 ml/min ([facility]) 10/05/2016  . Diabetes mellitus without complication ([place])   . Dyslipidemia 07/14/2019  . 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  [facility]   . Gastro-esophageal reflux disease without esophagitis 06/03/2012  . Gout 10/03/2016  . HTN (hypertension) 06/03/2012  . Hyperkalemia, diminished renal excretion 01/05/2019  . Hypertension   . Hypothyroidism 06/03/2012  . Iron deficiency anemia 10/02/2016  . Lumbar pseudoarthrosis 06/28/2016   Formatting of this note might be different from the original. Added automatically from request for surgery [phone]  . Memory deficit 06/27/2018    MMSE 06/2017: 27/30. Pass is 28.   . Nonspecific abnormal electrocardiogram (ECG) (EKG) 07/14/2019  . Normal cardiac stress test 06/09/2012   at [facility] normal cardiac stress test (surgery preop); Dr Norman Clay  . Osteoarthritis 10/02/2016  . Pain in right hip 01/03/2012   Formatting of this note might be different from the original. Overview:  Right greater than left. MRI done 2013 shows right hip has partial posterior labral tear. Also has component of greater trochanteric bursitis and had corticosteroid injection with good relief in early July, 2012  . Peripheral neuropathy 10/02/2016  . Precordial chest pain 07/14/2019  . Primary osteoarthritis of both hips 01/03/2012   Right greater than left. MRI done 2013 shows right hip has partial posterior labral tear. Also has component of greater trochanteric bursitis and had corticosteroid injection with good relief in early July, 2012   . Thrombocytopenia, unspecified ([place]) 02/12/2013   Formatting of this note might be different from the original. 10/1 IMO update  . Type 2 diabetes mellitus with stage 4 chronic kidney disease ([place]) 06/03/2012  . Uncontrolled type 2 diabetes with renal manifestation ([place]) 10/16/2019  . Vitamin D deficiency     Past Surgical History:  Procedure Laterality Date  . BACK SURGERY      Family History  Problem Relation Age of Onset  . Diabetes Mother   . Diabetes Brother   . Diabetes Son     Social History  Socioeconomic History  . Marital status: Widowed    Spouse name: Not on file  . Number of children: Not on file  . Years of education: Not on file  . Highest education level: Not on file  Occupational History  . Occupation: retired  Tobacco Use  . Smoking status: Former Smoker    Quit date: 09/08/1979    Years since quitting: 40.4  . Smokeless tobacco: Never Used  Substance and Sexual Activity  . Alcohol use: No  . Drug use: No  . Sexual activity: Never  Other Topics Concern  . Not on file  Social  History Narrative  . Not on file   Social Determinants of Health   Financial Resource Strain:   . Difficulty of Paying Living Expenses: Not on file  Food Insecurity:   . Worried About Charity fundraiser in the Last Year: Not on file  . Ran Out of Food in the Last Year: Not on file  Transportation Needs:   . Lack of Transportation (Medical): Not on file  . Lack of Transportation (Non-Medical): Not on file  Physical Activity:   . Days of Exercise per Week: Not on file  . Minutes of Exercise per Session: Not on file  Stress:   . Feeling of Stress : Not on file  Social Connections:   . Frequency of Communication with Friends and Family: Not on file  . Frequency of Social Gatherings with Friends and Family: Not on file  . Attends Religious Services: Not on file  . Active Member of Clubs or Organizations: Not on file  . Attends Archivist Meetings: Not on file  . Marital Status: Not on file  Intimate Partner Violence:   . Fear of Current or Ex-Partner: Not on file  . Emotionally Abused: Not on file  . Physically Abused: Not on file  . Sexually Abused: Not on file    Outpatient Medications Prior to Visit  Medication Sig Dispense Refill  . ACCUPRIL 20 MG tablet TAKE 1 TABLET AT BEDTIME 90 tablet 3  . acetaminophen (TYLENOL) 325 MG tablet Take 650 mg by mouth as needed.    Marland Kitchen albuterol (VENTOLIN HFA) 108 (90 Base) MCG/ACT inhaler Inhale 2 puffs into the lungs every 6 (six) hours as needed. 54 g 4  . amLODipine (NORVASC) 5 MG tablet Take 1 tablet (5 mg total) by mouth daily. 90 tablet 3  . colchicine 0.6 MG tablet Take 1 tablet (0.6 mg total) by mouth daily as needed. 1 tablet 0  . Continuous Blood Gluc Receiver (FREESTYLE LIBRE 2 READER) DEVI For checking blood sugars.Dx:E11.9 1 each 0  . Continuous Blood Gluc Sensor (FREESTYLE LIBRE 2 SENSOR) MISC 14 day sensor. Dx:E11.9 6 each 11  . diazepam (VALIUM) 10 MG tablet Take 10 mg by mouth as needed.    . Ferrous Sulfate (IRON)  325 (65 Fe) MG TABS Take 1 tablet by mouth 3 (three) times daily.    Marland Kitchen glipiZIDE (GLUCOTROL) 5 MG tablet Take 5 mg by mouth 2 (two) times daily.    Marland Kitchen glucose blood (FREESTYLE LITE) test strip For testing up to 4 times daily. DX: E11.9 400 each 4  . glucose blood (FREESTYLE PRECISION NEO TEST) test strip Dx. Diabetes. Test daily as needed. 100 each 6  . HUMULIN 70/30 KWIKPEN (70-30) 100 UNIT/ML KwikPen INJECT 34 UNITS IN THE MORNING AND 32 UNITS IN THE EVENING 45 mL 4  . isosorbide mononitrate (IMDUR) 60 MG 24 hr tablet Take 1  tablet (60 mg total) by mouth daily. 90 tablet 1  . levothyroxine (SYNTHROID) 50 MCG tablet TAKE 1 TABLET TWO TIMES A WEEK. ON SUNDAY AND WEDNESDAY. ALTERNATING WITH 75 MCG THE OTHER 5 DAYS 90 tablet 3  . levothyroxine (SYNTHROID) 75 MCG tablet Take 1 tablet (75 mcg total) by mouth daily. 90 tablet 3  . liraglutide (VICTOZA) 18 MG/3ML SOPN Inject 0.2 mLs (1.2 mg total) into the skin daily. 18 mL 3  . NARCAN 4 MG/0.1ML LIQD nasal spray kit CALL 911. ADMINISTER A SINGLE SPRAY OF NARCAN IN ONE NOSTRIL. REPEAT EVERY 3 MINUTES AS NEEDED IF NO OR MINIMAL RESPONSE.  0  . nitroGLYCERIN (NITROSTAT) 0.4 MG SL tablet Place under the tongue.    Marland Kitchen Oxycodone HCl 10 MG TABS     . polyethylene glycol (MIRALAX / GLYCOLAX) packet Take 17 g by mouth as needed.    Marland Kitchen terconazole (TERAZOL 7) 0.4 % vaginal cream Place 1 applicator vaginally at bedtime. 45 g 1  . tolterodine (DETROL LA) 4 MG 24 hr capsule Take 1 capsule (4 mg total) by mouth daily. 90 capsule 4  . Triamcinolone Acetonide (TRIAMCINOLONE 0.1 % CREAM : EUCERIN) CREA Apply 1 application topically 2 (two) times daily as needed for itching. 1 jar 1 each 1  . Vitamin D, Ergocalciferol, (DRISDOL) 1.25 MG (50000 UT) CAPS capsule TAKE 1 CAPSULE ONCE A WEEK 30 capsule 4  . XTAMPZA ER 18 MG C12A Take 1 capsule by mouth 2 (two) times daily.    . metoprolol tartrate (LOPRESSOR) 25 MG tablet Take 1 tablet (25 mg total) by mouth 2 (two) times daily.  180 tablet 3  . montelukast (SINGULAIR) 10 MG tablet Take 1 tablet (10 mg total) by mouth at bedtime. 90 tablet 3  . pravastatin (PRAVACHOL) 20 MG tablet Take 1 tablet (20 mg total) by mouth every evening. 90 tablet 3  . lactulose (CHRONULAC) 10 GM/15ML solution Take 15-30 mLs (10-20 g total) by mouth 2 (two) times daily as needed for mild constipation. 1892 mL 3   No facility-administered medications prior to visit.    Allergies  Allergen Reactions  . Amoxicillin Diarrhea and Itching    Other reaction(s): Confusion (intolerance)  . Atorvastatin Other (See Comments)    Burning skin sensation  . Hydrochlorothiazide     Other reaction(s): Other Chronic renal failure  . Nsaids Other (See Comments)    Peptic ulcer disease  . Tramadol Nausea And Vomiting    Other reaction(s): GI Upset (intolerance)  . Allopurinol Other (See Comments)    Makes her stomach hurt  . Morphine Other (See Comments)    Hallucinations  . Omeprazole Diarrhea  . Pepcid [Famotidine] Other (See Comments)    Cause her to become gasy  . Trulicity [Dulaglutide] Other (See Comments)  . Ciprofloxacin     Other reaction(s): GI Upset (intolerance) Other reaction(s): Abdominal Pain Other reaction(s): Abdominal Pain  . Gabapentin Nausea Only    Feels "stupid" and wt gain   . Pantoprazole Diarrhea    ROS Review of Systems    Objective:    Physical Exam Constitutional:      Appearance: She is well-developed.  HENT:     Head: Normocephalic and atraumatic.  Cardiovascular:     Rate and Rhythm: Normal rate and regular rhythm.     Heart sounds: Normal heart sounds.  Pulmonary:     Effort: Pulmonary effort is normal.     Breath sounds: Normal breath sounds.  Skin:  General: Skin is warm and dry.  Neurological:     Mental Status: She is alert and oriented to person, place, and time.  Psychiatric:        Behavior: Behavior normal.     BP (!) 119/54   Pulse 78   Ht 5' 4"  (1.626 m)   Wt 167 lb (75.8  kg)   SpO2 98%   BMI 28.67 kg/m  Wt Readings from Last 3 Encounters:  02/01/20 167 lb (75.8 kg)  01/11/20 165 lb 0.6 oz (74.9 kg)  12/22/19 166 lb (75.3 kg)     There are no preventive care reminders to display for this patient.  There are no preventive care reminders to display for this patient.  Lab Results  Component Value Date   TSH 0.98 06/26/2019   Lab Results  Component Value Date   WBC 7.9 07/14/2019   HGB 10.4 (L) 07/14/2019   HCT 33.6 (L) 07/14/2019   MCV 79 07/14/2019   PLT 136 (L) 07/14/2019   Lab Results  Component Value Date   NA 138 07/21/2019   K 5.0 07/21/2019   CO2 22 07/21/2019   GLUCOSE 184 (H) 07/21/2019   BUN 23 07/21/2019   CREATININE 1.81 (H) 07/21/2019   BILITOT 0.4 06/26/2019   ALKPHOS 57 11/28/2016   AST 80 (H) 06/26/2019   ALT 59 (H) 06/26/2019   PROT 7.6 06/26/2019   ALBUMIN 4.3 11/28/2016   CALCIUM 10.4 (H) 07/21/2019   Lab Results  Component Value Date   CHOL 135 09/25/2019   Lab Results  Component Value Date   HDL 50 09/25/2019   Lab Results  Component Value Date   LDLCALC 45 09/25/2019   Lab Results  Component Value Date   TRIG 262 (H) 09/25/2019   Lab Results  Component Value Date   CHOLHDL 2.7 09/25/2019   Lab Results  Component Value Date   HGBA1C 7.8 (A) 02/01/2020      Assessment & Plan:   Problem List Items Addressed This Visit      Endocrine   Type 2 diabetes mellitus with stage 4 chronic kidney disease ([place])    Okay to decrease your insulin down to 30 units in the morning and 15 in the evening.  Try this for the next 3 weeks if you are still having low blood sugars then please let me know.  If you feel either going too high then you can also let me know. F/u 3 mo        Other   Failed back surgical syndrome    Low back pain getting worse.  Will get MRI of lumbar spine for further workup.  Pain radiating into her legs as times.         Other Visit Diagnoses    Type 2 diabetes mellitus without  complication, with long-term current use of insulin ([place])    -  Primary   Relevant Orders   POCT glycosylated hemoglobin (Hb A1C) (Completed)   Chronic low back pain with bilateral sciatica, unspecified back pain laterality       Relevant Orders   MR LUMBAR SPINE WO CONTRAST      No orders of the defined types were placed in this encounter.   Follow-up: Return in about 3 months (around 05/03/2020) for Diabetes follow-up.    Beatrice Lecher, MD

## 2020-02-01 NOTE — Telephone Encounter (Signed)
Barbara Yu can you call [facility] on [address] to see what type of MRI for her spine would they recommend? With contrast? Without?  See if they have a peference.

## 2020-02-01 NOTE — Patient Instructions (Addendum)
Okay to decrease your insulin down to 30 units in the morning and 15 in the evening.  Try this for the next 3 weeks if you are still having low blood sugars then please let me know.  If you feel either going too high then you can also let me know.  We will see if we can get forms from your foot doctor for diabetic shoes.

## 2020-02-02 NOTE — Telephone Encounter (Signed)
Called and spoke with Dorian Pod at front desk. Nurse will call back on my direct line with recommendations.

## 2020-02-02 NOTE — Telephone Encounter (Signed)
Received call from Judson Roch, nurse with [facility]. MRI will need to be without contrast.   CB # [phone]

## 2020-02-03 ENCOUNTER — Encounter: Payer: Self-pay | Admitting: Family Medicine

## 2020-02-03 NOTE — Assessment & Plan Note (Signed)
Okay to decrease your insulin down to 30 units in the morning and 15 in the evening.  Try this for the next 3 weeks if you are still having low blood sugars then please let me know.  If you feel either going too high then you can also let me know. F/u 3 mo

## 2020-02-03 NOTE — Assessment & Plan Note (Signed)
Low back pain getting worse.  Will get MRI of lumbar spine for further workup.  Pain radiating into her legs as times.

## 2020-02-04 ENCOUNTER — Telehealth: Payer: Self-pay | Admitting: *Deleted

## 2020-02-04 NOTE — Telephone Encounter (Signed)
Pt called stating that she was at [facility] to get her 3rd Covid vaccine and was told that she would need to have the record of the immunization.  Immunization record faxed confirmation received .

## 2020-02-07 ENCOUNTER — Ambulatory Visit (INDEPENDENT_AMBULATORY_CARE_PROVIDER_SITE_OTHER): Payer: Medicare Other

## 2020-02-07 ENCOUNTER — Other Ambulatory Visit: Payer: Self-pay

## 2020-02-07 DIAGNOSIS — M5441 Lumbago with sciatica, right side: Secondary | ICD-10-CM

## 2020-02-07 DIAGNOSIS — G8929 Other chronic pain: Secondary | ICD-10-CM | POA: Diagnosis not present

## 2020-02-07 DIAGNOSIS — M5442 Lumbago with sciatica, left side: Secondary | ICD-10-CM

## 2020-02-09 MED ORDER — ISOSORBIDE MONONITRATE ER 30 MG PO TB24: 30.0000 mg | ORAL_TABLET | Freq: Every day | ORAL | 1 refills | Status: DC

## 2020-03-01 ENCOUNTER — Telehealth: Payer: Self-pay | Admitting: *Deleted

## 2020-03-01 MED ORDER — GLIPIZIDE 5 MG PO TABS: 5.0000 mg | ORAL_TABLET | Freq: Two times a day (BID) | ORAL | 3 refills | Status: DC

## 2020-03-01 NOTE — Telephone Encounter (Signed)
Pt called and stated that she is out of her gluoctrol and could not order from her mail order pharmacy. Asked that this be sent for her.Marland Kitchen

## 2020-03-08 HISTORY — DX: Hypercalcemia: E83.52

## 2020-04-08 ENCOUNTER — Other Ambulatory Visit: Payer: Self-pay | Admitting: Family Medicine

## 2020-04-08 DIAGNOSIS — J209 Acute bronchitis, unspecified: Secondary | ICD-10-CM

## 2020-04-11 ENCOUNTER — Other Ambulatory Visit: Payer: Self-pay | Admitting: *Deleted

## 2020-04-11 MED ORDER — LEVOTHYROXINE SODIUM 75 MCG PO TABS: 75.0000 ug | ORAL_TABLET | Freq: Every day | ORAL | 3 refills | Status: DC

## 2020-04-18 ENCOUNTER — Other Ambulatory Visit: Payer: Self-pay

## 2020-04-18 DIAGNOSIS — E119 Type 2 diabetes mellitus without complications: Secondary | ICD-10-CM | POA: Insufficient documentation

## 2020-04-19 ENCOUNTER — Other Ambulatory Visit: Payer: Self-pay

## 2020-04-19 ENCOUNTER — Ambulatory Visit (INDEPENDENT_AMBULATORY_CARE_PROVIDER_SITE_OTHER): Payer: Medicare Other | Admitting: Cardiology

## 2020-04-19 ENCOUNTER — Encounter: Payer: Self-pay | Admitting: Cardiology

## 2020-04-19 VITALS — BP 118/62 | HR 83 | Ht 64.0 in | Wt 165.0 lb

## 2020-04-19 DIAGNOSIS — E1122 Type 2 diabetes mellitus with diabetic chronic kidney disease: Secondary | ICD-10-CM | POA: Diagnosis not present

## 2020-04-19 DIAGNOSIS — Z794 Long term (current) use of insulin: Secondary | ICD-10-CM

## 2020-04-19 DIAGNOSIS — I209 Angina pectoris, unspecified: Secondary | ICD-10-CM

## 2020-04-19 DIAGNOSIS — N184 Chronic kidney disease, stage 4 (severe): Secondary | ICD-10-CM | POA: Diagnosis not present

## 2020-04-19 DIAGNOSIS — I10 Essential (primary) hypertension: Secondary | ICD-10-CM

## 2020-04-19 MED ORDER — ISOSORBIDE MONONITRATE ER 60 MG PO TB24: 60.0000 mg | ORAL_TABLET | Freq: Every day | ORAL | 3 refills | Status: DC

## 2020-04-19 NOTE — Progress Notes (Signed)
Cardiology Office Note:    Date:  04/19/2020   ID:  Barbara Yu, DOB 20-Nov-1945, MRN [id number]  PCP:  [name], MD  Cardiologist:  [name], MD    Referring MD: [name], *   No chief complaint on file. I am still having chest pains  History of Present Illness:    Barbara Yu is a 74 y.o. female with past medical history significant for multiple risk factors for coronary artery disease, that include essential hypertension, dyslipidemia, she did have a stress test which showed no evidence of ischemia however she describes fairly typical exertional chest pain that also happened with emotional stress relieved by nitroglycerin as well as relieved by rest relaxation.  Actually few weeks ago I received a message from her daughter who said that they decided to pursue cardiac catheterization.  We did talk about this from the very beginning however she does have significant kidney dysfunction therefore we decided to try to manage this problem medically.  She is still complaining of having chest pain that happen when she gets upset when she walks she takes nitroglycerin with good relief.  Past Medical History:  Diagnosis Date  . Anemia   . Anemia in stage 3 chronic kidney disease ([place]) 02/03/2018  . Anemia in stage 4 chronic kidney disease ([place]) 02/03/2018  . Angina pectoris ([place]) 01/11/2020  . AR (allergic rhinitis) 10/02/2016  . CKD (chronic kidney disease) stage 4, GFR 15-29 ml/min (HCC) 10/05/2016  . Diabetes mellitus without complication ([place])   . Dyslipidemia 07/14/2019  . 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  [place]   . Gastro-esophageal reflux disease without esophagitis 06/03/2012  . Gout 10/03/2016  . HTN (hypertension) 06/03/2012  . Hypercalcemia 03/08/2020  . Hyperkalemia, diminished renal  excretion 01/05/2019  . Hypertension   . Hypothyroidism 06/03/2012  . Iron deficiency anemia 10/02/2016  . Lumbar pseudoarthrosis 06/28/2016   Formatting of this note might be different from the original. Added automatically from request for surgery [phone]  . Memory deficit 06/27/2018   MMSE 06/2017: 27/30. Pass is 28.   . Nonspecific abnormal electrocardiogram (ECG) (EKG) 07/14/2019  . Normal cardiac stress test 06/09/2012   at [facility] normal cardiac stress test (surgery preop); Dr Norman Clay  . Osteoarthritis 10/02/2016  . Pain in right hip 01/03/2012   Formatting of this note might be different from the original. Overview:  Right greater than left. MRI done 2013 shows right hip has partial posterior labral tear. Also has component of greater trochanteric bursitis and had corticosteroid injection with good relief in early July, 2012  . Peripheral neuropathy 10/02/2016  . Precordial chest pain 07/14/2019  . Primary osteoarthritis of both hips 01/03/2012   Right greater than left. MRI done 2013 shows right hip has partial posterior labral tear. Also has component of greater trochanteric bursitis and had corticosteroid injection with good relief in early July, 2012   . Thrombocytopenia, unspecified ([place]) 02/12/2013   Formatting of this note might be different from the original. 10/1 IMO update  . Type 2 diabetes mellitus with stage 4 chronic kidney disease ([place]) 06/03/2012  . Uncontrolled type 2 diabetes with renal manifestation ([place]) 10/16/2019  . Vitamin D deficiency     Past Surgical History:  Procedure Laterality Date  . BACK SURGERY      Current Medications: Current Meds  Medication Sig  . ACCUPRIL  20 MG tablet TAKE 1 TABLET AT BEDTIME  . acetaminophen (TYLENOL) 325 MG tablet Take 650 mg by mouth as needed.  Marland Kitchen amLODipine (NORVASC) 5 MG tablet Take 1 tablet (5 mg total) by mouth daily.  . colchicine 0.6 MG tablet Take 1 tablet (0.6 mg total) by mouth daily as needed.  . Continuous Blood Gluc  Receiver (FREESTYLE LIBRE 2 READER) DEVI For checking blood sugars.Dx:E11.9  . Continuous Blood Gluc Sensor (FREESTYLE LIBRE 2 SENSOR) MISC 14 day sensor. Dx:E11.9  . diazepam (VALIUM) 10 MG tablet Take 10 mg by mouth as needed.  Marland Kitchen glipiZIDE (GLUCOTROL) 5 MG tablet Take 1 tablet (5 mg total) by mouth 2 (two) times daily.  Marland Kitchen glucose blood (FREESTYLE LITE) test strip For testing up to 4 times daily. DX: E11.9  . glucose blood (FREESTYLE PRECISION NEO TEST) test strip Dx. Diabetes. Test daily as needed.  Marland Kitchen HUMULIN 70/30 KWIKPEN (70-30) 100 UNIT/ML KwikPen INJECT 34 UNITS IN THE MORNING AND 32 UNITS IN THE EVENING  . isosorbide mononitrate (IMDUR) 30 MG 24 hr tablet Take 1 tablet (30 mg total) by mouth daily.  Marland Kitchen levothyroxine (SYNTHROID) 50 MCG tablet TAKE 1 TABLET TWO TIMES A WEEK. ON SUNDAY AND WEDNESDAY. ALTERNATING WITH 75 MCG THE OTHER 5 DAYS  . levothyroxine (SYNTHROID) 75 MCG tablet Take 1 tablet (75 mcg total) by mouth daily.  Marland Kitchen liraglutide (VICTOZA) 18 MG/3ML SOPN Inject 0.2 mLs (1.2 mg total) into the skin daily.  Marland Kitchen NARCAN 4 MG/0.1ML LIQD nasal spray kit CALL 911. ADMINISTER A SINGLE SPRAY OF NARCAN IN ONE NOSTRIL. REPEAT EVERY 3 MINUTES AS NEEDED IF NO OR MINIMAL RESPONSE.  . nitroGLYCERIN (NITROSTAT) 0.4 MG SL tablet Place under the tongue.  Marland Kitchen Oxycodone HCl 10 MG TABS   . polyethylene glycol (MIRALAX / GLYCOLAX) packet Take 17 g by mouth as needed.  Marland Kitchen PROAIR HFA 108 (90 Base) MCG/ACT inhaler USE 2 INHALATIONS EVERY 6 HOURS AS NEEDED  . terconazole (TERAZOL 7) 0.4 % vaginal cream Place 1 applicator vaginally at bedtime.  . tolterodine (DETROL LA) 4 MG 24 hr capsule Take 1 capsule (4 mg total) by mouth daily.  . Triamcinolone Acetonide (TRIAMCINOLONE 0.1 % CREAM : EUCERIN) CREA Apply 1 application topically 2 (two) times daily as needed for itching. 1 jar  . XTAMPZA ER 18 MG C12A Take 1 capsule by mouth 2 (two) times daily.     Allergies:   Amoxicillin, Atorvastatin, Hydrochlorothiazide,  Nsaids, Tramadol, Allopurinol, Morphine, Omeprazole, Pepcid [famotidine], Trulicity [dulaglutide], Ciprofloxacin, Gabapentin, and Pantoprazole   Social History   Socioeconomic History  . Marital status: Widowed    Spouse name: Not on file  . Number of children: Not on file  . Years of education: Not on file  . Highest education level: Not on file  Occupational History  . Occupation: retired  Tobacco Use  . Smoking status: Former Smoker    Quit date: 09/08/1979    Years since quitting: 40.6  . Smokeless tobacco: Never Used  Substance and Sexual Activity  . Alcohol use: No  . Drug use: No  . Sexual activity: Never  Other Topics Concern  . Not on file  Social History Narrative  . Not on file   Social Determinants of Health   Financial Resource Strain: Not on file  Food Insecurity: Not on file  Transportation Needs: Not on file  Physical Activity: Not on file  Stress: Not on file  Social Connections: Not on file     Family History:  The patient's family history includes Diabetes in her brother, mother, and son. ROS:   Please see the history of present illness.    All 14 point review of systems negative except as described per history of present illness  EKGs/Labs/Other Studies Reviewed:      Recent Labs: 06/26/2019: ALT 59; TSH 0.98 07/14/2019: Hemoglobin 10.4; Platelets 136 07/21/2019: BUN 23; Creatinine, Ser 1.81; Potassium 5.0; Sodium 138  Recent Lipid Panel    Component Value Date/Time   CHOL 135 09/25/2019 1507   TRIG 262 (H) 09/25/2019 1507   HDL 50 09/25/2019 1507   CHOLHDL 2.7 09/25/2019 1507   CHOLHDL 3.3 02/23/2019 1356   LDLCALC 45 09/25/2019 1507   LDLCALC 72 02/23/2019 1356    Physical Exam:    VS:  BP 118/62 (BP Location: Right Arm, Patient Position: Sitting, Cuff Size: Normal)   Pulse 83   Ht 5' 4" (1.626 m)   Wt 165 lb (74.8 kg)   SpO2 96%   BMI 28.32 kg/m     Wt Readings from Last 3 Encounters:  04/19/20 165 lb (74.8 kg)  02/01/20 167  lb (75.8 kg)  01/11/20 165 lb 0.6 oz (74.9 kg)     GEN:  Well nourished, well developed in no acute distress HEENT: Normal NECK: No JVD; No carotid bruits LYMPHATICS: No lymphadenopathy CARDIAC: RRR, no murmurs, no rubs, no gallops RESPIRATORY:  Clear to auscultation without rales, wheezing or rhonchi  ABDOMEN: Soft, non-tender, non-distended MUSCULOSKELETAL:  No edema; No deformity  SKIN: Warm and dry LOWER EXTREMITIES: no swelling NEUROLOGIC:  Alert and oriented x 3 PSYCHIATRIC:  Normal affect   ASSESSMENT:    1. Angina pectoris ([place])   2. Primary hypertension   3. Type 2 diabetes mellitus with stage 4 chronic kidney disease, with long-term current use of insulin ([place])   4. CKD (chronic kidney disease) stage 4, GFR 15-29 ml/min (HCC)    PLAN:    In order of problems listed above:  1. Fairly typical angina pectoris with normal stress test with document potential doing cardiac catheterization, however, after long discussion when I explained to her what cardiac catheterization is what involved with the risk and benefits she said that she would like to try albumin medications before pursuing cardiac catheterization.  Therefore, I will double the dose of Imdur.  She takes 30 we will go to 60 mg daily she is already on beta-blocker which I will continue.  I am a little reluctant to put her on ranolazine because of her kidney dysfunction.  She will use nitroglycerin on as-needed basis I see her very quickly within a month to see how she does.  We will still keep cardiac catheterization as an option. 2. Essential hypertension blood pressure well controlled continue present medications. 3. Diabetes: Her hemoglobin A1c was 7.8 this is in October of this year.  She is to work better in controlling her diabetes. 4. Chronic kidney failure with last creatinine of 1.8.  Obesity is of biggest concern.  Will try to avoid cardiac catheterization if we can however if she fails medical therapy then  cardiac catheterization will be needed.   Medication Adjustments/Labs and Tests Ordered: Current medicines are reviewed at length with the patient today.  Concerns regarding medicines are outlined above.  No orders of the defined types were placed in this encounter.  Medication changes: No orders of the defined types were placed in this encounter.   Signed, Park Liter, MD, [facility] 04/19/2020 1:51 PM  [facility]

## 2020-04-19 NOTE — Addendum Note (Signed)
Addended by: Resa Miner I on: 04/19/2020 01:56 PM   Modules accepted: Orders

## 2020-04-19 NOTE — Patient Instructions (Signed)
Medication Instructions:  Your physician has recommended you make the following change in your medication:  INCREASE: Imdur 60 mg take one tablet by mouth daily.  *If you need a refill on your cardiac medications before your next appointment, please call your pharmacy*   Lab Work: None If you have labs (blood work) drawn today and your tests are completely normal, you will receive your results only by:  [place] (if you have MyChart) OR  A paper copy in the mail If you have any lab test that is abnormal or we need to change your treatment, we will call you to review the results.   Testing/Procedures: None   Follow-Up: At [facility], you and your health needs are our priority.  As part of our continuing mission to provide you with exceptional heart care, we have created designated Provider Care Teams.  These Care Teams include your primary Cardiologist (physician) and Advanced Practice Providers (APPs -  Physician Assistants and Nurse Practitioners) who all work together to provide you with the care you need, when you need it.  We recommend signing up for the patient portal called "MyChart".  Sign up information is provided on this After Visit Summary.  MyChart is used to connect with patients for Virtual Visits (Telemedicine).  Patients are able to view lab/test results, encounter notes, upcoming appointments, etc.  Non-urgent messages can be sent to your provider as well.   To learn more about what you can do with MyChart, go to [email or web address].    Your next appointment:   1 month(s)  The format for your next appointment:   In Person  Provider:   [name], MD   Other Instructions

## 2020-05-03 ENCOUNTER — Ambulatory Visit: Payer: Medicare Other | Admitting: Family Medicine

## 2020-05-24 ENCOUNTER — Ambulatory Visit (INDEPENDENT_AMBULATORY_CARE_PROVIDER_SITE_OTHER): Payer: Medicare Other | Admitting: Cardiology

## 2020-05-24 ENCOUNTER — Other Ambulatory Visit: Payer: Self-pay

## 2020-05-24 ENCOUNTER — Encounter: Payer: Self-pay | Admitting: Cardiology

## 2020-05-24 VITALS — BP 112/62 | HR 96 | Ht 64.0 in | Wt 167.0 lb

## 2020-05-24 DIAGNOSIS — Z794 Long term (current) use of insulin: Secondary | ICD-10-CM

## 2020-05-24 DIAGNOSIS — I209 Angina pectoris, unspecified: Secondary | ICD-10-CM

## 2020-05-24 DIAGNOSIS — I10 Essential (primary) hypertension: Secondary | ICD-10-CM | POA: Diagnosis not present

## 2020-05-24 DIAGNOSIS — E1122 Type 2 diabetes mellitus with diabetic chronic kidney disease: Secondary | ICD-10-CM

## 2020-05-24 DIAGNOSIS — N184 Chronic kidney disease, stage 4 (severe): Secondary | ICD-10-CM | POA: Diagnosis not present

## 2020-05-24 MED ORDER — ISOSORBIDE MONONITRATE ER 120 MG PO TB24: 120.0000 mg | ORAL_TABLET | Freq: Every day | ORAL | 1 refills | Status: DC

## 2020-05-24 NOTE — Patient Instructions (Signed)
Medication Instructions:  Your physician has recommended you make the following change in your medication:   INCREASE: imdur to 120 mg daily   *If you need a refill on your cardiac medications before your next appointment, please call your pharmacy*   Lab Work: None If you have labs (blood work) drawn today and your tests are completely normal, you will receive your results only by: Marland Kitchen MyChart Message (if you have MyChart) OR . A paper copy in the mail If you have any lab test that is abnormal or we need to change your treatment, we will call you to review the results.   Testing/Procedures: None   Follow-Up: At [facility], you and your health needs are our priority.  As part of our continuing mission to provide you with exceptional heart care, we have created designated Provider Care Teams.  These Care Teams include your primary Cardiologist (physician) and Advanced Practice Providers (APPs -  Physician Assistants and Nurse Practitioners) who all work together to provide you with the care you need, when you need it.  We recommend signing up for the patient portal called "MyChart".  Sign up information is provided on this After Visit Summary.  MyChart is used to connect with patients for Virtual Visits (Telemedicine).  Patients are able to view lab/test results, encounter notes, upcoming appointments, etc.  Non-urgent messages can be sent to your provider as well.   To learn more about what you can do with MyChart, go to [email or web address].    Your next appointment:   3 month(s)  The format for your next appointment:   In Person  Provider:   [name], MD   Other Instructions

## 2020-05-24 NOTE — Progress Notes (Signed)
Cardiology Office Note:    Date:  05/24/2020   ID:  Barbara Yu, DOB [birth year], MRN [id number]  PCP:  [name], MD  Cardiologist:  [name], MD    Referring MD: [name], *   Chief Complaint  Patient presents with  . Follow-up  I am doing better  History of Present Illness:    Barbara Yu is a 75 y.o. female with past medical history significant for essential hypertension, dyslipidemia with intolerance to some statin, hypothyroidism, diabetes. She was sent to me because of fairly typical angina pectoris. Because of some kidney dysfunction we elected to proceed with stress testing stress test came normal. However she was still complaining of fairly typical angina pectoris we initiated conversation about potential doing cardiac catheterization, however, when she found out potential side effects including kidney dysfunction after cardiac catheterization she did not want to do it. We have been trying to manage this problem medically lately seems to be having some successes with that.  Past Medical History:  Diagnosis Date  . Anemia   . Anemia in stage 3 chronic kidney disease ([place]) 02/03/2018  . Anemia in stage 4 chronic kidney disease ([place]) 02/03/2018  . Angina pectoris ([place]) 01/11/2020  . AR (allergic rhinitis) 10/02/2016  . CKD (chronic kidney disease) stage 4, GFR 15-29 ml/min (HCC) 10/05/2016  . Diabetes mellitus without complication ([place])   . Dyslipidemia 07/14/2019  . 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  [place]   . Gastro-esophageal reflux disease without esophagitis 06/03/2012  . Gout 10/03/2016  . HTN (hypertension) 06/03/2012  . Hypercalcemia 03/08/2020  . Hyperkalemia, diminished renal excretion 01/05/2019  . Hypertension   . Hypothyroidism 06/03/2012  . Iron deficiency anemia 10/02/2016  .  Lumbar pseudoarthrosis 06/28/2016   Formatting of this note might be different from the original. Added automatically from request for surgery [id number]  . Memory deficit 06/27/2018   MMSE 06/2017: 27/30. Pass is 28.   . Nonspecific abnormal electrocardiogram (ECG) (EKG) 07/14/2019  . Normal cardiac stress test 06/09/2012   at [facility] normal cardiac stress test (surgery preop); Dr Norman Clay  . Osteoarthritis 10/02/2016  . Pain in right hip 01/03/2012   Formatting of this note might be different from the original. Overview:  Right greater than left. MRI done 2013 shows right hip has partial posterior labral tear. Also has component of greater trochanteric bursitis and had corticosteroid injection with good relief in early July, 2012  . Peripheral neuropathy 10/02/2016  . Precordial chest pain 07/14/2019  . Primary osteoarthritis of both hips 01/03/2012   Right greater than left. MRI done 2013 shows right hip has partial posterior labral tear. Also has component of greater trochanteric bursitis and had corticosteroid injection with good relief in early July, 2012   . Thrombocytopenia, unspecified ([place]) 02/12/2013   Formatting of this note might be different from the original. 10/1 IMO update  . Type 2 diabetes mellitus with stage 4 chronic kidney disease ([place]) 06/03/2012  . Uncontrolled type 2 diabetes with renal manifestation ([place]) 10/16/2019  . Vitamin D deficiency     Past Surgical History:  Procedure Laterality Date  . BACK SURGERY      Current Medications: Current Meds  Medication Sig  . ACCUPRIL 20 MG tablet TAKE 1 TABLET AT BEDTIME  . acetaminophen (TYLENOL) 325 MG tablet Take 650 mg by mouth as needed.  Marland Kitchen  amLODipine (NORVASC) 5 MG tablet Take 1 tablet (5 mg total) by mouth daily.  . colchicine 0.6 MG tablet Take 1 tablet (0.6 mg total) by mouth daily as needed.  . diazepam (VALIUM) 10 MG tablet Take 10 mg by mouth as needed.  Marland Kitchen glipiZIDE (GLUCOTROL) 5 MG tablet Take 1 tablet (5 mg total) by mouth 2  (two) times daily.  Marland Kitchen HUMULIN 70/30 KWIKPEN (70-30) 100 UNIT/ML KwikPen INJECT 34 UNITS IN THE MORNING AND 32 UNITS IN THE EVENING  . isosorbide mononitrate (IMDUR) 60 MG 24 hr tablet Take 1 tablet (60 mg total) by mouth daily.  Marland Kitchen levothyroxine (SYNTHROID) 50 MCG tablet TAKE 1 TABLET TWO TIMES A WEEK. ON SUNDAY AND WEDNESDAY. ALTERNATING WITH 75 MCG THE OTHER 5 DAYS  . levothyroxine (SYNTHROID) 75 MCG tablet Take 1 tablet (75 mcg total) by mouth daily.  Marland Kitchen liraglutide (VICTOZA) 18 MG/3ML SOPN Inject 0.2 mLs (1.2 mg total) into the skin daily.  . montelukast (SINGULAIR) 10 MG tablet Take 1 tablet (10 mg total) by mouth at bedtime.  Marland Kitchen NARCAN 4 MG/0.1ML LIQD nasal spray kit CALL 911. ADMINISTER A SINGLE SPRAY OF NARCAN IN ONE NOSTRIL. REPEAT EVERY 3 MINUTES AS NEEDED IF NO OR MINIMAL RESPONSE.  Marland Kitchen Oxycodone HCl 10 MG TABS   . polyethylene glycol (MIRALAX / GLYCOLAX) packet Take 17 g by mouth as needed.  . pravastatin (PRAVACHOL) 20 MG tablet Take 1 tablet (20 mg total) by mouth every evening.  Marland Kitchen PROAIR HFA 108 (90 Base) MCG/ACT inhaler USE 2 INHALATIONS EVERY 6 HOURS AS NEEDED  . terconazole (TERAZOL 7) 0.4 % vaginal cream Place 1 applicator vaginally at bedtime.  . tolterodine (DETROL LA) 4 MG 24 hr capsule Take 1 capsule (4 mg total) by mouth daily.     Allergies:   Amoxicillin, Atorvastatin, Hydrochlorothiazide, Nsaids, Tramadol, Allopurinol, Morphine, Omeprazole, Pepcid [famotidine], Trulicity [dulaglutide], Ciprofloxacin, Gabapentin, and Pantoprazole   Social History   Socioeconomic History  . Marital status: Widowed    Spouse name: Not on file  . Number of children: Not on file  . Years of education: Not on file  . Highest education level: Not on file  Occupational History  . Occupation: retired  Tobacco Use  . Smoking status: Former Smoker    Quit date: 09/08/1979    Years since quitting: 40.7  . Smokeless tobacco: Never Used  Substance and Sexual Activity  . Alcohol use: No  .  Drug use: No  . Sexual activity: Never  Other Topics Concern  . Not on file  Social History Narrative  . Not on file   Social Determinants of Health   Financial Resource Strain: Not on file  Food Insecurity: Not on file  Transportation Needs: Not on file  Physical Activity: Not on file  Stress: Not on file  Social Connections: Not on file     Family History: The patient's family history includes Diabetes in her brother, mother, and son. ROS:   Please see the history of present illness.    All 14 point review of systems negative except as described per history of present illness  EKGs/Labs/Other Studies Reviewed:      Recent Labs: 06/26/2019: ALT 59; TSH 0.98 07/14/2019: Hemoglobin 10.4; Platelets 136 07/21/2019: BUN 23; Creatinine, Ser 1.81; Potassium 5.0; Sodium 138  Recent Lipid Panel    Component Value Date/Time   CHOL 135 09/25/2019 1507   TRIG 262 (H) 09/25/2019 1507   HDL 50 09/25/2019 1507   CHOLHDL 2.7 09/25/2019 1507  CHOLHDL 3.3 02/23/2019 1356   LDLCALC 45 09/25/2019 1507   LDLCALC 72 02/23/2019 1356    Physical Exam:    VS:  BP 112/62 (BP Location: Left Arm, Patient Position: Sitting)   Pulse 96   Ht 5' 4"  (1.626 m)   Wt 167 lb (75.8 kg)   SpO2 96%   BMI 28.67 kg/m     Wt Readings from Last 3 Encounters:  05/24/20 167 lb (75.8 kg)  04/19/20 165 lb (74.8 kg)  02/01/20 167 lb (75.8 kg)     GEN:  Well nourished, well developed in no acute distress HEENT: Normal NECK: No JVD; No carotid bruits LYMPHATICS: No lymphadenopathy CARDIAC: RRR, no murmurs, no rubs, no gallops RESPIRATORY:  Clear to auscultation without rales, wheezing or rhonchi  ABDOMEN: Soft, non-tender, non-distended MUSCULOSKELETAL:  No edema; No deformity  SKIN: Warm and dry LOWER EXTREMITIES: no swelling NEUROLOGIC:  Alert and oriented x 3 PSYCHIATRIC:  Normal affect   ASSESSMENT:    1. Angina pectoris ([place])   2. Primary hypertension   3. Type 2 diabetes mellitus with  stage 4 chronic kidney disease, with long-term current use of insulin (HCC)    PLAN:    In order of problems listed above:  1. Angina pectoris seems to improving with medical therapy I will try to increase dose of Imdur from 6220 mg daily, continue rest of the medications. 2. Primary hypertension blood pressure seems to be well controlled continue present management. 3. Dyslipidemia she does have difficulty tolerating statin she is only on pravastatin with LDL of 45 HDL 50 which is quite acceptable we will continue that management. 4. She complained to me about hair loss and she thinks it may be related to some of the medication I doubt very much however she does take Synthroid and her thyroid need to be checked, I wanted her to have a check however she prefers her primary care physician to do it.   Medication Adjustments/Labs and Tests Ordered: Current medicines are reviewed at length with the patient today.  Concerns regarding medicines are outlined above.  No orders of the defined types were placed in this encounter.  Medication changes: No orders of the defined types were placed in this encounter.   Signed, Park Liter, MD, [facility] 05/24/2020 3:13 PM    [place]

## 2020-05-24 NOTE — Addendum Note (Signed)
Addended by: Senaida Ores on: 05/24/2020 03:19 PM   Modules accepted: Orders

## 2020-05-26 ENCOUNTER — Encounter: Payer: Self-pay | Admitting: Family Medicine

## 2020-05-26 ENCOUNTER — Other Ambulatory Visit: Payer: Self-pay

## 2020-05-26 ENCOUNTER — Ambulatory Visit (INDEPENDENT_AMBULATORY_CARE_PROVIDER_SITE_OTHER): Payer: Medicare Other | Admitting: Family Medicine

## 2020-05-26 VITALS — BP 116/63 | HR 87 | Ht 64.0 in | Wt 166.0 lb

## 2020-05-26 DIAGNOSIS — M1A0790 Idiopathic chronic gout, unspecified ankle and foot, without tophus (tophi): Secondary | ICD-10-CM

## 2020-05-26 DIAGNOSIS — I209 Angina pectoris, unspecified: Secondary | ICD-10-CM

## 2020-05-26 DIAGNOSIS — I10 Essential (primary) hypertension: Secondary | ICD-10-CM

## 2020-05-26 DIAGNOSIS — E038 Other specified hypothyroidism: Secondary | ICD-10-CM | POA: Diagnosis not present

## 2020-05-26 DIAGNOSIS — Z794 Long term (current) use of insulin: Secondary | ICD-10-CM

## 2020-05-26 DIAGNOSIS — N184 Chronic kidney disease, stage 4 (severe): Secondary | ICD-10-CM

## 2020-05-26 DIAGNOSIS — E1122 Type 2 diabetes mellitus with diabetic chronic kidney disease: Secondary | ICD-10-CM

## 2020-05-26 DIAGNOSIS — N83201 Unspecified ovarian cyst, right side: Secondary | ICD-10-CM

## 2020-05-26 LAB — POCT GLYCOSYLATED HEMOGLOBIN (HGB A1C): Hemoglobin A1C: 7.9 % — AB (ref 4.0–5.6)

## 2020-05-26 NOTE — Progress Notes (Signed)
Established Patient Office Visit  Subjective:  Patient ID: Barbara Yu, female    DOB: Oct 08, 1945  Age: 75 y.o. MRN: [id number]  CC:  Chief Complaint  Patient presents with   Diabetes    HPI [name] presents for   Diabetes - 2 hypoglycemic events in the last couple of months.  She now wears a continuous meter and has the app loaded on her phone.  The lows were in the evening one was between 6 and 9 PM and the other one was between 9 PM and midnight.. No wounds or sores that are not healing well. No increased thirst or urination. Checking glucose at home. Taking medications as prescribed without any side effects.  She is currently on Humulin 70/30.  Sometimes she does take it twice a day but occasionally she only takes it once a day if she gets up a little later and does not eat breakfast.  He still admits she really does not eat a lot when she eats.  So she is always worried about running low.  Hypothyroidism - Taking medication regularly in the AM away from food and vitamins, etc. + recent change to skin, hair, or energy levels.  Complains of hair loss and increased dry skin.  She wonders if her level may be off it feels similar to when she was first diagnosed with thyroid problems.  She is also felt more fatigued for several months.  She says she really likes to get out and shop and just feels like she has not been able to do that with COVID.  She really misses it and misses being able to really get out of the house.  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  [facility]    Gastro-esophageal reflux disease without esophagitis 06/03/2012   Gout 10/03/2016   HTN (hypertension) 06/03/2012   Hypercalcemia 03/08/2020   Hyperkalemia, diminished renal excretion 01/05/2019   Hypertension    Hypothyroidism 06/03/2012   Iron deficiency anemia 10/02/2016   Lumbar pseudoarthrosis 06/28/2016   Formatting of this note might be different from the original. Added automatically from request for surgery [id number]   Memory deficit 06/27/2018   MMSE 06/2017: 27/30. Pass is 28.    Nonspecific abnormal electrocardiogram (ECG) (EKG) 07/14/2019   Normal cardiac stress test 06/09/2012   at [facility] normal cardiac stress test (surgery preop); Dr Norman Clay   Osteoarthritis 10/02/2016   Pain in right hip 01/03/2012   Formatting of this note might be different from the original. Overview:  Right greater than left. MRI done 2013 shows right hip has partial posterior labral tear. Also has component of greater trochanteric bursitis and had corticosteroid injection with good relief in early July, 2012   Peripheral neuropathy 10/02/2016   Precordial chest pain 07/14/2019   Primary osteoarthritis of both hips 01/03/2012   Right greater than left. MRI done 2013 shows right hip has partial posterior labral tear. Also has component of greater trochanteric bursitis and  had corticosteroid injection with good relief in early July, 2012    Thrombocytopenia, unspecified ([place]) 02/12/2013   Formatting of this note might be different from the original. 10/1 IMO update   Type 2 diabetes mellitus with stage 4 chronic kidney disease ([place]) 06/03/2012   Uncontrolled type 2 diabetes with renal manifestation ([place]) 10/16/2019   Vitamin D deficiency     Past Surgical History:  Procedure Laterality Date   BACK SURGERY      Family History  Problem Relation Age of Onset    Diabetes Mother    Diabetes Brother    Diabetes Son     Social History   Socioeconomic History   Marital status: Widowed    Spouse name: Not on file   Number of children: Not on file   Years of education: Not on file   Highest education level: Not on file  Occupational History   Occupation: retired  Tobacco Use   Smoking status: Former Smoker    Quit date: 09/08/1979    Years since quitting: 40.7   Smokeless tobacco: Never Used  Substance and Sexual Activity   Alcohol use: No   Drug use: No   Sexual activity: Never  Other Topics Concern   Not on file  Social History Narrative   Not on file   Social Determinants of Health   Financial Resource Strain: Not on file  Food Insecurity: Not on file  Transportation Needs: Not on file  Physical Activity: Not on file  Stress: Not on file  Social Connections: Not on file  Intimate Partner Violence: Not on file    Outpatient Medications Prior to Visit  Medication Sig Dispense Refill   ACCUPRIL 20 MG tablet TAKE 1 TABLET AT BEDTIME 90 tablet 3   acetaminophen (TYLENOL) 325 MG tablet Take 650 mg by mouth as needed.     amLODipine (NORVASC) 5 MG tablet Take 1 tablet (5 mg total) by mouth daily. 90 tablet 3   colchicine 0.6 MG tablet Take 1 tablet (0.6 mg total) by mouth daily as needed. 1 tablet 0   Continuous Blood Gluc Sensor (FREESTYLE LIBRE 2 SENSOR) MISC 14 day sensor. Dx:E11.9 6 each 11   diazepam (VALIUM) 10 MG tablet Take 10 mg by mouth as needed.     glipiZIDE (GLUCOTROL) 5 MG tablet Take 1 tablet (5 mg total) by mouth 2 (two) times daily. 180 tablet 3   HUMULIN 70/30 KWIKPEN (70-30) 100 UNIT/ML KwikPen INJECT 34 UNITS IN THE MORNING AND 32 UNITS IN THE EVENING 45 mL 4   isosorbide mononitrate (IMDUR) 120 MG 24 hr tablet Take 1 tablet (120 mg total) by mouth daily. 90 tablet 1   levothyroxine (SYNTHROID) 50 MCG tablet TAKE 1 TABLET TWO TIMES A WEEK. ON SUNDAY AND WEDNESDAY. ALTERNATING WITH 75 MCG  THE OTHER 5 DAYS 90 tablet 3   levothyroxine (SYNTHROID) 75 MCG tablet Take 1 tablet (75 mcg total) by mouth daily. 90 tablet 3   liraglutide (VICTOZA) 18 MG/3ML SOPN Inject 0.2 mLs (1.2 mg total) into the skin daily. 18 mL 3   metoprolol tartrate (LOPRESSOR) 25 MG tablet Take 1 tablet (25 mg total) by mouth 2 (two) times daily. 180 tablet 3   montelukast (SINGULAIR) 10 MG tablet Take 1 tablet (10 mg total) by mouth at bedtime. 90 tablet 3   NARCAN 4 MG/0.1ML LIQD nasal spray kit CALL 911. ADMINISTER A SINGLE SPRAY OF NARCAN IN ONE NOSTRIL. REPEAT EVERY 3 MINUTES AS NEEDED IF NO OR  MINIMAL RESPONSE.  0   nitroGLYCERIN (NITROSTAT) 0.4 MG SL tablet Place under the tongue.     Oxycodone HCl 10 MG TABS      polyethylene glycol (MIRALAX / GLYCOLAX) packet Take 17 g by mouth as needed.     pravastatin (PRAVACHOL) 20 MG tablet Take 1 tablet (20 mg total) by mouth every evening. 90 tablet 3   PROAIR HFA 108 (90 Base) MCG/ACT inhaler USE 2 INHALATIONS EVERY 6 HOURS AS NEEDED 51 g 3   terconazole (TERAZOL 7) 0.4 % vaginal cream Place 1 applicator vaginally at bedtime. 45 g 1   tolterodine (DETROL LA) 4 MG 24 hr capsule Take 1 capsule (4 mg total) by mouth daily. 90 capsule 4   XTAMPZA ER 18 MG C12A Take 1 capsule by mouth 2 (two) times daily.     No facility-administered medications prior to visit.    Allergies  Allergen Reactions   Amoxicillin Diarrhea and Itching    Other reaction(s): Confusion (intolerance)   Atorvastatin Other (See Comments)    Burning skin sensation   Hydrochlorothiazide     Other reaction(s): Other Chronic renal failure   Nsaids Other (See Comments)    Peptic ulcer disease   Tramadol Nausea And Vomiting    Other reaction(s): GI Upset (intolerance)   Allopurinol Other (See Comments)    Makes her stomach hurt   Morphine Other (See Comments)    Hallucinations   Omeprazole Diarrhea   Pepcid [Famotidine] Other (See Comments)    Cause her to become  gasy   Trulicity [Dulaglutide] Other (See Comments)   Ciprofloxacin     Other reaction(s): GI Upset (intolerance) Other reaction(s): Abdominal Pain Other reaction(s): Abdominal Pain   Gabapentin Nausea Only    Feels "stupid" and wt gain    Pantoprazole Diarrhea    ROS Review of Systems    Objective:    Physical Exam Constitutional:      Appearance: She is well-developed and well-nourished.  HENT:     Head: Normocephalic and atraumatic.  Cardiovascular:     Rate and Rhythm: Normal rate and regular rhythm.     Heart sounds: Normal heart sounds.  Pulmonary:     Effort: Pulmonary effort is normal.     Breath sounds: Normal breath sounds.  Skin:    General: Skin is warm and dry.  Neurological:     Mental Status: She is alert and oriented to person, place, and time.  Psychiatric:        Mood and Affect: Mood and affect normal.        Behavior: Behavior normal.     BP 116/63    Pulse 87    Ht _0  (1.626 m)    Wt 166 lb (75.3 kg)    SpO2 95%    BMI 28.49 kg/m  Wt Readings from Last 3 Encounters:  05/26/20 166 lb (75.3 kg)  05/24/20 167 lb (75.8 kg)  04/19/20 165 lb (74.8 kg)     Health Maintenance Due  Topic Date Due   OPHTHALMOLOGY EXAM  02/26/2020    There are no preventive care reminders to display for this patient.  Lab Results  Component Value Date   TSH 2.55 05/26/2020   Lab Results  Component Value Date   WBC 7.9 07/14/2019   HGB 10.4 (L) 07/14/2019   HCT 33.6 (L) 07/14/2019   MCV 79 07/14/2019   PLT 136 (L) 07/14/2019   Lab Results  Component Value Date   NA 138  05/26/2020   K 4.7 05/26/2020   CO2 20 05/26/2020   GLUCOSE 202 (H) 05/26/2020   BUN 24 05/26/2020   CREATININE 2.05 (H) 05/26/2020   BILITOT 0.4 06/26/2019   ALKPHOS 57 11/28/2016   AST 80 (H) 06/26/2019   ALT 59 (H) 06/26/2019   PROT 7.6 06/26/2019   ALBUMIN 4.3 11/28/2016   CALCIUM 9.4 05/26/2020   Lab Results  Component Value Date   CHOL 135 09/25/2019   Lab  Results  Component Value Date   HDL 50 09/25/2019   Lab Results  Component Value Date   LDLCALC 45 09/25/2019   Lab Results  Component Value Date   TRIG 262 (H) 09/25/2019   Lab Results  Component Value Date   CHOLHDL 2.7 09/25/2019   Lab Results  Component Value Date   HGBA1C 7.9 (A) 05/26/2020      Assessment & Plan:   Problem List Items Addressed This Visit      Cardiovascular and Mediastinum   HTN (hypertension)    Well controlled. Continue current regimen. Follow up in  3 mo. Due for labs today.         Endocrine   Type 2 diabetes mellitus with stage 4 chronic kidney disease (HCC) - Primary    A1c went up just slightly.  It is still under 8 technically which is really where would like to keep her she tends to drop and have a hypoglycemic episode pretty easily I am glad she now has a continuous meter.  It should hopefully be alerting her if she drops too low.  Continue to work on making healthier food choices and really staying active.  Just encouraged to continue to work on staying active and moving and even working on some stretching etc.      Relevant Orders   POCT glycosylated hemoglobin (Hb A1C) (Completed)   Ambulatory referral to Optometry   Hypothyroidism    Due to recheck TSH.  She is concerned because she has noticed some changes including dry skin, recent hair loss over the last maybe 3 months, slower bowel movements.      Relevant Orders   TSH (Completed)     Genitourinary   CKD (chronic kidney disease) stage 4, GFR 15-29 ml/min (HCC)    Renal function has actually been relatively stable for the last 2 years she follows with nephrology every 3 to 4 months.      Relevant Orders   BASIC METABOLIC PANEL WITH GFR (Completed)     Other   Gout    Uric acid level has been phenomenally low.  She has not had any flares.         No orders of the defined types were placed in this encounter.   Follow-up: Return in about 3 months (around 08/23/2020)  for Diabetes follow-up.    Beatrice Lecher, MD

## 2020-05-27 LAB — BASIC METABOLIC PANEL WITH GFR
BUN/Creatinine Ratio: 12 (calc) (ref 6–22)
BUN: 24 mg/dL (ref 7–25)
CO2: 20 mmol/L (ref 20–32)
Calcium: 9.4 mg/dL (ref 8.6–10.4)
Chloride: 106 mmol/L (ref 98–110)
Creat: 2.05 mg/dL — ABNORMAL HIGH (ref 0.60–0.93)
GFR, Est African American: 27 mL/min/{1.73_m2} — ABNORMAL LOW (ref >=60)
GFR, Est Non African American: 23 mL/min/{1.73_m2} — ABNORMAL LOW (ref >=60)
Glucose, Bld: 202 mg/dL — ABNORMAL HIGH (ref 65–99)
Potassium: 4.7 mmol/L (ref 3.5–5.3)
Sodium: 138 mmol/L (ref 135–146)

## 2020-05-27 LAB — TSH: TSH: 2.55 mIU/L (ref 0.40–4.50)

## 2020-05-27 NOTE — Assessment & Plan Note (Signed)
Due to recheck TSH.  She is concerned because she has noticed some changes including dry skin, recent hair loss over the last maybe 3 months, slower bowel movements.

## 2020-05-27 NOTE — Assessment & Plan Note (Signed)
Renal function has actually been relatively stable for the last 2 years she follows with nephrology every 3 to 4 months.

## 2020-05-27 NOTE — Assessment & Plan Note (Signed)
A1c went up just slightly.  It is still under 8 technically which is really where would like to keep her she tends to drop and have a hypoglycemic episode pretty easily I am glad she now has a continuous meter.  It should hopefully be alerting her if she drops too low.  Continue to work on making healthier food choices and really staying active.  Just encouraged to continue to work on staying active and moving and even working on some stretching etc.

## 2020-05-27 NOTE — Assessment & Plan Note (Signed)
Well controlled. Continue current regimen. Follow up in  3 mo. Due for labs today.

## 2020-05-27 NOTE — Assessment & Plan Note (Signed)
Uric acid level has been phenomenally low.  She has not had any flares.

## 2020-05-30 ENCOUNTER — Encounter: Payer: Self-pay | Admitting: Family Medicine

## 2020-05-30 ENCOUNTER — Telehealth: Payer: Self-pay | Admitting: *Deleted

## 2020-05-30 ENCOUNTER — Telehealth (INDEPENDENT_AMBULATORY_CARE_PROVIDER_SITE_OTHER): Payer: Medicare Other | Admitting: Family Medicine

## 2020-05-30 VITALS — BP 105/63 | HR 95 | Ht 64.0 in | Wt 166.0 lb

## 2020-05-30 DIAGNOSIS — I209 Angina pectoris, unspecified: Secondary | ICD-10-CM

## 2020-05-30 DIAGNOSIS — N83201 Unspecified ovarian cyst, right side: Secondary | ICD-10-CM | POA: Diagnosis not present

## 2020-05-30 DIAGNOSIS — R748 Abnormal levels of other serum enzymes: Secondary | ICD-10-CM | POA: Diagnosis not present

## 2020-05-30 DIAGNOSIS — A059 Bacterial foodborne intoxication, unspecified: Secondary | ICD-10-CM

## 2020-05-30 NOTE — Telephone Encounter (Signed)
Transition Care Management Follow-up Telephone Call  Date of discharge and from where: 05/27/2020 - [facility]  How have you been since you were released from the hospital? "Feeling much better"  Any questions or concerns? No  Items Reviewed:  Did the pt receive and understand the discharge instructions provided? Yes   Medications obtained and verified? Yes   Other? N/A  Any new allergies since your discharge? No   Dietary orders reviewed? Yes  Do you have support at home? Yes   Home Care and Equipment/Supplies: Were home health services ordered? not applicable If so, what is the name of the agency? N/A  Has the agency set up a time to come to the patient's home? not applicable Were any new equipment or medical supplies ordered?  No What is the name of the medical supply agency? N/A Were you able to get the supplies/equipment? not applicable Do you have any questions related to the use of the equipment or supplies? No  Functional Questionnaire: (I = Independent and D = Dependent) ADLs: I  Bathing/Dressing- I  Meal Prep- I  Eating- I  Maintaining continence- I  Transferring/Ambulation- I  Managing Meds- I  Follow up appointments reviewed:   PCP Hospital f/u appt confirmed? Yes  Scheduled to see Dr. Madilyn Fireman on 05/30/2020 @ 1500.  [facility] f/u appt confirmed? N/A   Are transportation arrangements needed? No  If their condition worsens, is the pt aware to call PCP or go to the Emergency Dept.? Yes  Was the patient provided with contact information for the PCP's office or ED? Yes  Was to pt encouraged to call back with questions or concerns? Yes

## 2020-05-30 NOTE — Progress Notes (Signed)
Virtual Visit via Video Note  I connected with Barbara Yu on 05/30/20 at  3:20 PM EST by a video enabled telemedicine application and verified that I am speaking with the correct person using two identifiers.   I discussed the limitations of evaluation and management by telemedicine and the availability of in person appointments. The patient expressed understanding and agreed to proceed.  Patient location: at home  Provider location: in office  Subjective:    CC: Hospital Follow up    HPI: [name] was actually seen here on February 3.  After she went home from her visit she had picked up some [employer] salad.  She waited to eat it till about 530 and then later that evening started vomiting and having diarrhea.  She reports that the vomit was a dark brown color.  She said it really did not even look like it had pieces of salad in it.  he also had some abdominal pain that was little bit more left-sided.  No sick contacts.  No blood in stool.  No history of pancreatitis.  No alcohol use.  She says that she went to the emergency room for further evaluation she did have a CT that showed just some cysts in the right adnexa with the largest measuring approximately 2.7 x 1.9 centimeters in size.  No other abnormal findings.  No sign of diverticulitis or colitis.  White blood cell count was normal.  She did have a mildly elevated lipase of 89.  Creatinine was 2.0 which was fairly stable for her.  They were unable to do contrast with the imaging because of her renal function.  She is overall feeling much better and has been able to keep food down today.   Past medical history, Surgical history, Family history not pertinant except as noted below, Social history, Allergies, and medications have been entered into the medical record, reviewed, and corrections made.   Review of Systems: No fevers, chills, night sweats, weight loss, chest pain, or shortness of breath.   Objective:    General:  Speaking clearly in complete sentences without any shortness of breath.  Alert and oriented x3.  Normal judgment. No apparent acute distress.    Impression and Recommendations:    No problem-specific Assessment & Plan notes found for this encounter.  Food poisoning causing gastroenteritis-she is actually feeling much better and has been able to keep some food down today she is no longer feeling nauseated and the diarrhea seems to have resolved.  Continue to gradually advance diet as tolerated.  Elevated Lipase-plan to recheck next week to make sure that it has gone back down to normal.  Cystic lesions in the right lower quadrant possibly ovarian.  We will schedule for pelvic ultrasound for further work-up being that she is postmenopausal she really should not have a cystic ovary.    Time spent in encounter 21 minutes  I discussed the assessment and treatment plan with the patient. The patient was provided an opportunity to ask questions and all were answered. The patient agreed with the plan and demonstrated an understanding of the instructions.   The patient was advised to call back or seek an in-person evaluation if the symptoms worsen or if the condition fails to improve as anticipated.   Beatrice Lecher, MD

## 2020-05-30 NOTE — Progress Notes (Signed)
Pt reports that she is doing well.   Denies any abdominal pain, nausea,vomiting or diarrhea. Her appetite is ok she had a cup of coffee, and a [place] sandwich earlier.

## 2020-06-10 ENCOUNTER — Other Ambulatory Visit: Payer: Self-pay

## 2020-06-10 ENCOUNTER — Ambulatory Visit (INDEPENDENT_AMBULATORY_CARE_PROVIDER_SITE_OTHER): Payer: Medicare Other

## 2020-06-10 DIAGNOSIS — N83201 Unspecified ovarian cyst, right side: Secondary | ICD-10-CM | POA: Diagnosis not present

## 2020-06-10 DIAGNOSIS — D259 Leiomyoma of uterus, unspecified: Secondary | ICD-10-CM | POA: Diagnosis not present

## 2020-06-11 LAB — LIPASE: Lipase: 102 U/L — ABNORMAL HIGH (ref 7–60)

## 2020-06-13 ENCOUNTER — Other Ambulatory Visit: Payer: Self-pay | Admitting: *Deleted

## 2020-06-13 ENCOUNTER — Encounter: Payer: Self-pay | Admitting: Family Medicine

## 2020-06-13 DIAGNOSIS — R748 Abnormal levels of other serum enzymes: Secondary | ICD-10-CM

## 2020-06-13 DIAGNOSIS — R112 Nausea with vomiting, unspecified: Secondary | ICD-10-CM

## 2020-06-13 DIAGNOSIS — N83201 Unspecified ovarian cyst, right side: Secondary | ICD-10-CM

## 2020-06-13 NOTE — Addendum Note (Signed)
Addended by: Beatrice Lecher D on: 06/13/2020 12:03 PM   Modules accepted: Orders

## 2020-06-15 LAB — HM DIABETES EYE EXAM

## 2020-06-24 DIAGNOSIS — R1900 Intra-abdominal and pelvic swelling, mass and lump, unspecified site: Secondary | ICD-10-CM

## 2020-06-24 HISTORY — DX: Intra-abdominal and pelvic swelling, mass and lump, unspecified site: R19.00

## 2020-06-27 ENCOUNTER — Ambulatory Visit (INDEPENDENT_AMBULATORY_CARE_PROVIDER_SITE_OTHER): Payer: Medicare Other

## 2020-06-27 ENCOUNTER — Other Ambulatory Visit: Payer: Self-pay

## 2020-06-27 ENCOUNTER — Other Ambulatory Visit: Payer: Self-pay | Admitting: Family Medicine

## 2020-06-27 DIAGNOSIS — R112 Nausea with vomiting, unspecified: Secondary | ICD-10-CM | POA: Diagnosis not present

## 2020-06-27 DIAGNOSIS — R748 Abnormal levels of other serum enzymes: Secondary | ICD-10-CM

## 2020-06-30 ENCOUNTER — Telehealth: Payer: Self-pay | Admitting: Cardiology

## 2020-06-30 ENCOUNTER — Ambulatory Visit (INDEPENDENT_AMBULATORY_CARE_PROVIDER_SITE_OTHER): Payer: Medicare Other | Admitting: Cardiology

## 2020-06-30 ENCOUNTER — Other Ambulatory Visit: Payer: Self-pay

## 2020-06-30 ENCOUNTER — Encounter: Payer: Self-pay | Admitting: Cardiology

## 2020-06-30 VITALS — BP 134/74 | HR 64 | Ht 64.0 in | Wt 165.0 lb

## 2020-06-30 DIAGNOSIS — I209 Angina pectoris, unspecified: Secondary | ICD-10-CM

## 2020-06-30 DIAGNOSIS — E119 Type 2 diabetes mellitus without complications: Secondary | ICD-10-CM | POA: Diagnosis not present

## 2020-06-30 DIAGNOSIS — N184 Chronic kidney disease, stage 4 (severe): Secondary | ICD-10-CM

## 2020-06-30 DIAGNOSIS — Z01810 Encounter for preprocedural cardiovascular examination: Secondary | ICD-10-CM | POA: Diagnosis not present

## 2020-06-30 NOTE — Patient Instructions (Signed)
Medication Instructions:  Your physician recommends that you continue on your current medications as directed. Please refer to the Current Medication list given to you today.  *If you need a refill on your cardiac medications before your next appointment, please call your pharmacy*   Lab Work: .None  If you have labs (blood work) drawn today and your tests are completely normal, you will receive your results only by: . MyChart Message (if you have MyChart) OR . A paper copy in the mail If you have any lab test that is abnormal or we need to change your treatment, we will call you to review the results.   Testing/Procedures:   [facility] at [address], [address] [place], [place] [address] Phone: [phone]    Please arrive 15 minutes prior to your appointment time for registration and insurance purposes.  The test will take approximately 3 to 4 hours to complete; you may bring reading material.  If someone comes with you to your appointment, they will need to remain in the main lobby due to limited space in the testing area. **If you are pregnant or breastfeeding, please notify the nuclear lab prior to your appointment**  How to prepare for your Myocardial Perfusion Test: . Do not eat or drink 3 hours prior to your test, except you may have water. . Do not consume products containing caffeine (regular or decaffeinated) 12 hours prior to your test. (ex: coffee, chocolate, sodas, tea). . Do bring a list of your current medications with you.  If not listed below, you may take your medications as normal.  . Do wear comfortable clothes (no dresses or overalls) and walking shoes, tennis shoes preferred (No heels or open toe shoes are allowed). . Do NOT wear cologne, perfume, aftershave, or lotions (deodorant is allowed). . If these instructions are not followed, your test will have to be rescheduled.  Please report to [address], [address] for your test.  If you have questions or concerns about your appointment, you can call the Nuclear Lab at [phone].  If you cannot keep your appointment, please provide 24 hours notification to the Nuclear Lab, to avoid a possible $50 charge to your account.    Follow-Up: At [facility], you and your health needs are our priority.  As part of our continuing mission to provide you with exceptional heart care, we have created designated Provider Care Teams.  These Care Teams include your primary Cardiologist (physician) and Advanced Practice Providers (APPs -  Physician Assistants and Nurse Practitioners) who all work together to provide you with the care you need, when you need it.  We recommend signing up for the patient portal called "MyChart".  Sign up information is provided on this After Visit Summary.  MyChart is used to connect with patients for Virtual Visits (Telemedicine).  Patients are able to view lab/test results, encounter notes, upcoming appointments, etc.  Non-urgent messages can be sent to your provider as well.   To learn more about what you can do with MyChart, go to [email or web address]://[email or web address].com.    Your next appointment:   6 month(s)  The format for your next appointment:   In Person  Provider:   Robert Krasowski, MD   Other Instructions   Cardiac Nuclear Scan A cardiac nuclear scan is a test that measures blood flow to the heart when a person is resting and when he or she is exercising. The test looks for problems such   Not enough blood reaching a portion of the heart.  The heart muscle not working normally. You may need this test if:  You have heart disease.  You have had abnormal lab results.  You have had heart surgery or a balloon procedure to open up blocked arteries (angioplasty).  You have chest pain.  You have shortness of breath. In this test, a radioactive dye (tracer) is injected into your bloodstream. After the tracer has  traveled to your heart, an imaging device is used to measure how much of the tracer is absorbed by or distributed to various areas of your heart. This procedure is usually done at a hospital and takes 2-4 hours. Tell a health care provider about:  Any allergies you have.  All medicines you are taking, including vitamins, herbs, eye drops, creams, and over-the-counter medicines.  Any problems you or family members have had with anesthetic medicines.  Any blood disorders you have.  Any surgeries you have had.  Any medical conditions you have.  Whether you are pregnant or may be pregnant. What are the risks? Generally, this is a safe procedure. However, problems may occur, including:  Serious chest pain and heart attack. This is only a risk if the stress portion of the test is done.  Rapid heartbeat.  Sensation of warmth in your chest. This usually passes quickly.  Allergic reaction to the tracer. What happens before the procedure?  Ask your health care provider about changing or stopping your regular medicines. This is especially important if you are taking diabetes medicines or blood thinners.  Follow instructions from your health care provider about eating or drinking restrictions.  Remove your jewelry on the day of the procedure. What happens during the procedure?  An IV will be inserted into one of your veins.  Your health care provider will inject a small amount of radioactive tracer through the IV.  You will wait for 20-40 minutes while the tracer travels through your bloodstream.  Your heart activity will be monitored with an electrocardiogram (ECG).  You will lie down on an exam table.  Images of your heart will be taken for about 15-20 minutes.  You may also have a stress test. For this test, one of the following may be done: ? You will exercise on a treadmill or stationary bike. While you exercise, your heart's activity will be monitored with an ECG, and your  blood pressure will be checked. ? You will be given medicines that will increase blood flow to parts of your heart. This is done if you are unable to exercise.  When blood flow to your heart has peaked, a tracer will again be injected through the IV.  After 20-40 minutes, you will get back on the exam table and have more images taken of your heart.  Depending on the type of tracer used, scans may need to be repeated 3-4 hours later.  Your IV line will be removed when the procedure is over. The procedure may vary among health care providers and hospitals. What happens after the procedure?  Unless your health care provider tells you otherwise, you may return to your normal schedule, including diet, activities, and medicines.  Unless your health care provider tells you otherwise, you may increase your fluid intake. This will help to flush the contrast dye from your body. Drink enough fluid to keep your urine pale yellow.  Ask your health care provider, or the department that is doing the test: ? When will my results  be ready? ? How will I get my results? Summary  A cardiac nuclear scan measures the blood flow to the heart when a person is resting and when he or she is exercising.  Tell your health care provider if you are pregnant.  Before the procedure, ask your health care provider about changing or stopping your regular medicines. This is especially important if you are taking diabetes medicines or blood thinners.  After the procedure, unless your health care provider tells you otherwise, increase your fluid intake. This will help flush the contrast dye from your body.  After the procedure, unless your health care provider tells you otherwise, you may return to your normal schedule, including diet, activities, and medicines. This information is not intended to replace advice given to you by your health care provider. Make sure you discuss any questions you have with your health care  provider. Document Revised: 09/23/2017 Document Reviewed: 09/23/2017 Elsevier Patient Education  [place].

## 2020-06-30 NOTE — Telephone Encounter (Signed)
Called [facility] informed them the patient will not be cleared for surgery before next Tuesday because sh requires a stress test which we cannot due until Wednesday she understood, they will follow up with patient to get her rescheduled. Will fax office note over from today.

## 2020-06-30 NOTE — Progress Notes (Signed)
Cardiology Office Note:    Date:  06/30/2020   ID:  [name], DOB 1945-09-21, MRN [id number]  PCP:  [name], MD  Cardiologist:  [name], MD    Referring MD: [name], *   Chief Complaint  Patient presents with  . Clearance 07/05/2020 Dr. Clenton Pare    Fibroids removal  I need to surgery for my fibroids  History of Present Illness:    Barbara Yu is a 75 y.o. female who I had the pleasure of following for many years because of fairly typical angina pectoris.  We were talking about potentially doing cardiac catheterization but she does have baseline kidney dysfunction and being fairly controlled with medications therefore we did not pursue cardiac catheterization procedure.  Last April she did have a stress test which showed no evidence of ischemia.  She has been doing quite well still have some episodes of chest pain.  Apparently she was fine to have fibroids in her uterus and plan is to do hysterectomy.  To be done under general esthesia.  She told me that she gets chest tightness especially when she gets excited.  She tell me that today she was rushing trying to get to some appointments and that she developed tightness in the chest, she took nitroglycerin pain subsided.  Normally she does not get that symptoms as long as she take beta-blocker and her long-acting nitrates.  Overall her exercise tolerance is not good.  She is have a problem with the right knee as well as some back.  However she tells me that she can go to [employer] and do some shopping  Past Medical History:  Diagnosis Date  . Anemia   . Anemia in stage 3 chronic kidney disease ([place]) 02/03/2018  . Anemia in stage 4 chronic kidney disease ([place]) 02/03/2018  . Angina pectoris ([place]) 01/11/2020  . AR (allergic rhinitis) 10/02/2016  . CKD (chronic kidney disease) stage 4, GFR 15-29 ml/min (HCC) 10/05/2016  . Diabetes mellitus without complication ([place])   . Dyslipidemia 07/14/2019  .  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  [place]   . Gastro-esophageal reflux disease without esophagitis 06/03/2012  . Gout 10/03/2016  . HTN (hypertension) 06/03/2012  . Hypercalcemia 03/08/2020  . Hyperkalemia, diminished renal excretion 01/05/2019  . Hypertension   . Hypothyroidism 06/03/2012  . Iron deficiency anemia 10/02/2016  . Lumbar pseudoarthrosis 06/28/2016   Formatting of this note might be different from the original. Added automatically from request for surgery [phone]  . Memory deficit 06/27/2018   MMSE 06/2017: 27/30. Pass is 28.   . Nonspecific abnormal electrocardiogram (ECG) (EKG) 07/14/2019  . Normal cardiac stress test 06/09/2012   at [facility] normal cardiac stress test (surgery preop); Dr Norman Clay  . Osteoarthritis 10/02/2016  . Pain in right hip 01/03/2012   Formatting of this note might be different from the original. Overview:  Right greater than left. MRI done 2013 shows right hip has partial posterior labral tear. Also has component of greater trochanteric bursitis and had corticosteroid injection with good relief in early July, 2012  . Peripheral neuropathy 10/02/2016  . Precordial chest pain 07/14/2019  . Primary osteoarthritis of both hips 01/03/2012   Right greater than left. MRI done 2013 shows right hip has partial posterior labral tear. Also has component of greater trochanteric bursitis and had corticosteroid injection with good relief  in early July, 2012   . Thrombocytopenia, unspecified ([place]) 02/12/2013   Formatting of this note might be different from the original. 10/1 IMO update  . Type 2 diabetes mellitus with stage 4 chronic kidney disease ([place]) 06/03/2012  . Uncontrolled type 2 diabetes with renal manifestation ([place]) 10/16/2019  . Vitamin D deficiency     Past Surgical History:  Procedure Laterality Date  . BACK SURGERY       Current Medications: Current Meds  Medication Sig  . ACCUPRIL 20 MG tablet TAKE 1 TABLET AT BEDTIME  . acetaminophen (TYLENOL) 325 MG tablet Take 650 mg by mouth as needed.  Marland Kitchen amLODipine (NORVASC) 5 MG tablet Take 1 tablet (5 mg total) by mouth daily.  . colchicine 0.6 MG tablet Take 1 tablet (0.6 mg total) by mouth daily as needed.  . Continuous Blood Gluc Sensor (FREESTYLE LIBRE 2 SENSOR) MISC 14 day sensor. Dx:E11.9  . diazepam (VALIUM) 10 MG tablet Take 10 mg by mouth as needed.  Marland Kitchen glipiZIDE (GLUCOTROL) 5 MG tablet Take 1 tablet (5 mg total) by mouth 2 (two) times daily.  Marland Kitchen HUMULIN 70/30 KWIKPEN (70-30) 100 UNIT/ML KwikPen INJECT 34 UNITS IN THE MORNING AND 32 UNITS IN THE EVENING  . isosorbide mononitrate (IMDUR) 120 MG 24 hr tablet Take 1 tablet (120 mg total) by mouth daily.  Marland Kitchen levothyroxine (SYNTHROID) 50 MCG tablet TAKE 1 TABLET TWO TIMES A WEEK. ON SUNDAY AND WEDNESDAY. ALTERNATING WITH 75 MCG THE OTHER 5 DAYS  . levothyroxine (SYNTHROID) 75 MCG tablet Take 1 tablet (75 mcg total) by mouth daily.  Marland Kitchen liraglutide (VICTOZA) 18 MG/3ML SOPN Inject 0.2 mLs (1.2 mg total) into the skin daily.  . metoprolol tartrate (LOPRESSOR) 25 MG tablet Take 1 tablet (25 mg total) by mouth 2 (two) times daily.  . montelukast (SINGULAIR) 10 MG tablet Take 1 tablet (10 mg total) by mouth at bedtime.  Marland Kitchen NARCAN 4 MG/0.1ML LIQD nasal spray kit CALL 911. ADMINISTER A SINGLE SPRAY OF NARCAN IN ONE NOSTRIL. REPEAT EVERY 3 MINUTES AS NEEDED IF NO OR MINIMAL RESPONSE.  . nitroGLYCERIN (NITROSTAT) 0.4 MG SL tablet Place under the tongue.  Marland Kitchen Oxycodone HCl 10 MG TABS   . polyethylene glycol (MIRALAX / GLYCOLAX) packet Take 17 g by mouth as needed.  . pravastatin (PRAVACHOL) 20 MG tablet Take 1 tablet (20 mg total) by mouth every evening.  Marland Kitchen PROAIR HFA 108 (90 Base) MCG/ACT inhaler USE 2 INHALATIONS EVERY 6 HOURS AS NEEDED  . terconazole (TERAZOL 7) 0.4 % vaginal cream Place 1 applicator vaginally at bedtime.   . tolterodine (DETROL LA) 4 MG 24 hr capsule Take 1 capsule (4 mg total) by mouth daily.  Marland Kitchen XTAMPZA ER 18 MG C12A Take 1 capsule by mouth 2 (two) times daily.     Allergies:   Amoxicillin, Atorvastatin, Hydrochlorothiazide, Nsaids, Tramadol, Allopurinol, Morphine, Omeprazole, Pepcid [famotidine], Trulicity [dulaglutide], Ciprofloxacin, Gabapentin, and Pantoprazole   Social History   Socioeconomic History  . Marital status: Widowed    Spouse name: Not on file  . Number of children: Not on file  . Years of education: Not on file  . Highest education level: Not on file  Occupational History  . Occupation: retired  Tobacco Use  . Smoking status: Former Smoker    Quit date: 09/08/1979    Years since quitting: 40.8  . Smokeless tobacco: Never Used  Substance and Sexual Activity  . Alcohol use: No  . Drug use: No  . Sexual activity: Never  Other Topics Concern  . Not on file  Social History Narrative  . Not on file   Social Determinants of Health   Financial Resource Strain: Not on file  Food Insecurity: Not on file  Transportation Needs: Not on file  Physical Activity: Not on file  Stress: Not on file  Social Connections: Not on file     Family History: The patient's family history includes Diabetes in her brother, mother, and son. ROS:   Please see the history of present illness.    All 14 point review of systems negative except as described per history of present illness  EKGs/Labs/Other Studies Reviewed:      Recent Labs: 07/14/2019: Hemoglobin 10.4; Platelets 136 05/26/2020: BUN 24; Creat 2.05; Potassium 4.7; Sodium 138; TSH 2.55  Recent Lipid Panel    Component Value Date/Time   CHOL 135 09/25/2019 1507   TRIG 262 (H) 09/25/2019 1507   HDL 50 09/25/2019 1507   CHOLHDL 2.7 09/25/2019 1507   CHOLHDL 3.3 02/23/2019 1356   LDLCALC 45 09/25/2019 1507   LDLCALC 72 02/23/2019 1356    Physical Exam:    VS:  BP 134/74 (BP Location: Right Arm, Patient Position:  Sitting)   Pulse 64   Ht _0  (1.626 m)   Wt 165 lb (74.8 kg)   SpO2 97%   BMI 28.32 kg/m     Wt Readings from Last 3 Encounters:  06/30/20 165 lb (74.8 kg)  05/30/20 166 lb (75.3 kg)  05/26/20 166 lb (75.3 kg)     GEN:  Well nourished, well developed in no acute distress HEENT: Normal NECK: No JVD; No carotid bruits LYMPHATICS: No lymphadenopathy CARDIAC: RRR, no murmurs, no rubs, no gallops RESPIRATORY:  Clear to auscultation without rales, wheezing or rhonchi  ABDOMEN: Soft, non-tender, non-distended MUSCULOSKELETAL:  No edema; No deformity  SKIN: Warm and dry LOWER EXTREMITIES: no swelling NEUROLOGIC:  Alert and oriented x 3 PSYCHIATRIC:  Normal affect   ASSESSMENT:    1. Angina pectoris ([place])   2. Diabetes mellitus without complication ([place])   3. CKD (chronic kidney disease) stage 4, GFR 15-29 ml/min (HCC)   4. Preop cardiovascular exam    PLAN:    In order of problems listed above:  1. Angina pectoris stable pattern and [place] classification class II.  I will schedule her to have exercise Cardiolite 20 see what her endurance of the treadmill is too healthy to determine risk for incoming surgery under general anesthesia.  I am afraid we will have to postpone surgery and wait for stress test results to make better assessment of this complex situation. 2. Diabetes to be followed by internal medicine team.  Apparently stable. 3. Essential hypertension blood pressures controlled.  Her blood pressure 134/74 today. 4. Dyslipidemia her last K PN show me and HDL of 50 and LDL of 45 and this is on pravastatin 20 will continue. 5. Cardiovascular preop evaluation: Very difficult situation difficult to objectively assess her ability to exercise on top of that she does have fairly typical angina pectoris, ideally she should be evaluated with cardiac catheterization but because of her kidney dysfunction with try to get better assessment by doing exercise Cardiolite.  If exercise  Cardiolite is negative then will be able to proceed with surgery as scheduled if it is abnormal only mildly with good exercise tolerance we still will be able to pursue surgical intervention.  Otherwise we may look for alternative way to manage her fibroids.   Medication Adjustments/Labs and  Tests Ordered: Current medicines are reviewed at length with the patient today.  Concerns regarding medicines are outlined above.  No orders of the defined types were placed in this encounter.  Medication changes: No orders of the defined types were placed in this encounter.   Signed, Park Liter, MD, [facility] 06/30/2020 3:45 PM    [place]

## 2020-06-30 NOTE — Telephone Encounter (Signed)
[facility] is calling requesting the patient's clearance information be faxed over to them after her appointment at the office today. Their fax number is [phone]. Please advise.

## 2020-07-04 ENCOUNTER — Other Ambulatory Visit (HOSPITAL_COMMUNITY)
Admission: RE | Admit: 2020-07-04 | Discharge: 2020-07-04 | Disposition: A | Discharge status: Home / Self Care | Payer: Medicare Other | Source: Ambulatory Visit | Attending: Cardiology | Admitting: Cardiology

## 2020-07-04 DIAGNOSIS — Z20822 Contact with and (suspected) exposure to covid-19: Secondary | ICD-10-CM | POA: Diagnosis not present

## 2020-07-04 DIAGNOSIS — Z01812 Encounter for preprocedural laboratory examination: Secondary | ICD-10-CM | POA: Diagnosis present

## 2020-07-05 ENCOUNTER — Encounter: Payer: Self-pay | Admitting: Family Medicine

## 2020-07-05 ENCOUNTER — Telehealth (HOSPITAL_COMMUNITY): Payer: Self-pay | Admitting: *Deleted

## 2020-07-05 LAB — SARS CORONAVIRUS 2 (TAT 6-24 HRS): SARS Coronavirus 2: NEGATIVE

## 2020-07-05 NOTE — Telephone Encounter (Signed)
Close encounter 

## 2020-07-06 ENCOUNTER — Ambulatory Visit (HOSPITAL_COMMUNITY)
Admission: RE | Admit: 2020-07-06 | Discharge: 2020-07-06 | Disposition: A | Discharge status: Home / Self Care | Payer: Medicare Other | Source: Ambulatory Visit | Attending: Cardiovascular Disease | Admitting: Cardiovascular Disease

## 2020-07-06 ENCOUNTER — Other Ambulatory Visit: Payer: Self-pay

## 2020-07-06 DIAGNOSIS — I209 Angina pectoris, unspecified: Secondary | ICD-10-CM | POA: Diagnosis not present

## 2020-07-06 DIAGNOSIS — E119 Type 2 diabetes mellitus without complications: Secondary | ICD-10-CM | POA: Diagnosis present

## 2020-07-06 DIAGNOSIS — N184 Chronic kidney disease, stage 4 (severe): Secondary | ICD-10-CM

## 2020-07-06 DIAGNOSIS — Z01810 Encounter for preprocedural cardiovascular examination: Secondary | ICD-10-CM | POA: Diagnosis present

## 2020-07-06 LAB — MYOCARDIAL PERFUSION IMAGING
LV dias vol: 68 mL (ref 46–106)
LV sys vol: 22 mL
Peak HR: 116 {beats}/min
Rest HR: 85 {beats}/min
SDS: 4
SRS: 1
SSS: 5
TID: 0.94

## 2020-07-06 MED ORDER — TECHNETIUM TC 99M TETROFOSMIN IV KIT
31.0000 | PACK | Freq: Once | INTRAVENOUS | Status: AC | PRN
  Administered 2020-07-06: 31 via INTRAVENOUS
  Filled 2020-07-06: qty 31

## 2020-07-06 MED ORDER — REGADENOSON 0.4 MG/5ML IV SOLN
0.4000 mg | Freq: Once | INTRAVENOUS | Status: AC
  Administered 2020-07-06: 0.4 mg via INTRAVENOUS

## 2020-07-06 MED ORDER — AMINOPHYLLINE 25 MG/ML IV SOLN
75.0000 mg | Freq: Once | INTRAVENOUS | Status: AC
  Administered 2020-07-06: 75 mg via INTRAVENOUS

## 2020-07-06 MED ORDER — TECHNETIUM TC 99M TETROFOSMIN IV KIT
11.0000 | PACK | Freq: Once | INTRAVENOUS | Status: AC | PRN
  Administered 2020-07-06: 11 via INTRAVENOUS
  Filled 2020-07-06: qty 11

## 2020-07-08 ENCOUNTER — Telehealth: Payer: Self-pay

## 2020-07-08 NOTE — Telephone Encounter (Signed)
Patient  notified of results and verbalized understanding. Patient also inquired about her clearance for pending surgery. I called Dr. Juleen China office and left a message to fax a clearance request. I provided our contact information.

## 2020-07-11 ENCOUNTER — Telehealth: Payer: Self-pay | Admitting: Cardiology

## 2020-07-11 NOTE — Telephone Encounter (Signed)
   [facility] Pre-operative Risk Assessment    HEARTCARE STAFF: - Please ensure there is not already an duplicate clearance open for this procedure. - Under Visit Info/Reason for Call, type in Other and utilize the format Clearance MM/DD/YY or Clearance TBD. Do not use dashes or single digits. - If request is for dental extraction, please clarify the # of teeth to be extracted.  Request for surgical clearance:  1. What type of surgery is being performed? Robot hysterectomy with bilateral salpingoophorectomy resection of pelvic mass   2. When is this surgery scheduled? TBD   3. What type of clearance is required (medical clearance vs. Pharmacy clearance to hold med vs. Both)? Medical  4. Are there any medications that need to be held prior to surgery and how long? Hold any blood thinners,  Hold vitamins 7 days prior  5. Practice name and name of physician performing surgery? [place], Dr. Juleen China  6. What is the office phone number? [phone]   7.   What is the office fax number? [phone]  8.   Anesthesia type (None, local, MAC, general) ? General    Barbara Yu 07/11/2020, 2:15 PM  _________________________________________________________________   (provider comments below)

## 2020-07-11 NOTE — Telephone Encounter (Signed)
   Primary Cardiologist: [name], MD  Chart reviewed as part of pre-operative protocol coverage. Dr. Agustin Cree recently saw the patient in clinic for preop clearance and recommended stress test. He felt if stress test were normal, she could proceed with surgery as scheduled. Nuclear stress test 07/06/20 was normal. Per MyChart message exchange 07/08/20, Dr. Agustin Cree stated "Yes, stress test was normal, she can proceed with surgery." The patient does not have any blood thinners listed by our records. Will defer to the surgical team for their assessment of holding any vitamins she takes. Will route this bundled recommendation, with recent stress test and office visit, to requesting provider via Epic fax function. Please call with questions.   Charlie Pitter, PA-C 07/11/2020, 4:06 PM

## 2020-08-23 ENCOUNTER — Ambulatory Visit (INDEPENDENT_AMBULATORY_CARE_PROVIDER_SITE_OTHER): Payer: Medicare Other | Admitting: Family Medicine

## 2020-08-23 ENCOUNTER — Encounter: Payer: Self-pay | Admitting: Family Medicine

## 2020-08-23 ENCOUNTER — Telehealth: Payer: Self-pay | Admitting: Family Medicine

## 2020-08-23 ENCOUNTER — Other Ambulatory Visit: Payer: Self-pay

## 2020-08-23 VITALS — BP 116/55 | HR 88 | Ht 64.0 in | Wt 166.0 lb

## 2020-08-23 DIAGNOSIS — I209 Angina pectoris, unspecified: Secondary | ICD-10-CM

## 2020-08-23 DIAGNOSIS — I10 Essential (primary) hypertension: Secondary | ICD-10-CM | POA: Diagnosis not present

## 2020-08-23 DIAGNOSIS — N184 Chronic kidney disease, stage 4 (severe): Secondary | ICD-10-CM

## 2020-08-23 DIAGNOSIS — E119 Type 2 diabetes mellitus without complications: Secondary | ICD-10-CM | POA: Diagnosis not present

## 2020-08-23 DIAGNOSIS — N949 Unspecified condition associated with female genital organs and menstrual cycle: Secondary | ICD-10-CM | POA: Diagnosis not present

## 2020-08-23 LAB — POCT GLYCOSYLATED HEMOGLOBIN (HGB A1C): Hemoglobin A1C: 8 % — AB (ref 4.0–5.6)

## 2020-08-23 NOTE — Assessment & Plan Note (Signed)
Well controlled. Continue current regimen. Follow up in  3-4 mo  

## 2020-08-23 NOTE — Progress Notes (Signed)
 Established Patient Office Visit  Subjective:  Patient ID: Barbara Yu, female    DOB: 04/24/1945  Age: 75 y.o. MRN: [id number]  CC:  Chief Complaint  Patient presents with  . Diabetes    HPI Barbara Yu presents for   Diabetes - no hypoglycemic events. No wounds or sores that are not healing well. No increased thirst or urination. Checking glucose at home. Taking medications as prescribed without any side effects.  She has had a robotic assisted hysterectomy and bilateral salpingo-oophorectomy since I last saw her for a pelvic mass.  Fortunately the mass was benign.   Final Diagnosis   [facility]  Uterus with cervix, bilateral ovaries and fallopian tubes; hysterectomy and bilateral salpingo-oophorectomy:              Mild chronic cystic cervicitis.              Inactive endometrium, negative for endometrial hyperplasia.              Submucosal and intramural leiomyomata of myometrium.              Serous cystadenoma of right ovary.              Benign companion fallopian tube, showing changes consistent with prior tubal ligation.              Benign left ovary and fallopian tube.              Changes consistent with prior tubal ligation.   Chronic constipation-she is been using her MiraLAX daily and that seems to be working well.  Complains that she does get some occasional vaginal burning.  She has not had any abnormal discharge etc.  Past Medical History:  Diagnosis Date  . Anemia   . Anemia in stage 3 chronic kidney disease (HCC) 02/03/2018  . Anemia in stage 4 chronic kidney disease (HCC) 02/03/2018  . Angina pectoris (HCC) 01/11/2020  . AR (allergic rhinitis) 10/02/2016  . CKD (chronic kidney disease) stage 4, GFR 15-29 ml/min (HCC) 10/05/2016  . Diabetes mellitus without complication (HCC)   . Dyslipidemia 07/14/2019  . 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  [facility]   . Gastro-esophageal reflux disease without esophagitis 06/03/2012  . Gout 10/03/2016  . HTN (hypertension) 06/03/2012  . Hypercalcemia 03/08/2020  . Hyperkalemia, diminished renal excretion 01/05/2019  . Hypertension   . Hypothyroidism 06/03/2012  . Iron deficiency anemia 10/02/2016  . Lumbar pseudoarthrosis 06/28/2016   Formatting of this note might be different from the original. Added automatically from request for surgery [id number]  . Memory deficit 06/27/2018   MMSE 06/2017: 27/30. Pass is 28.   . Nonspecific abnormal electrocardiogram (ECG) (EKG) 07/14/2019  . Normal cardiac stress test 06/09/2012   at [facility] normal cardiac stress test (surgery preop); Dr Upadhya  . Osteoarthritis 10/02/2016  . Pain in right hip 01/03/2012   Formatting of this note might be different from the original. Overview:  Right greater than left. MRI done 2013 shows right hip has partial posterior labral tear. Also has component of greater trochanteric bursitis and had corticosteroid injection with good relief in early July, 2012  . Peripheral neuropathy 10/02/2016  . Precordial chest pain 07/14/2019  . Primary osteoarthritis of both hips 01/03/2012   Right greater than left. MRI done 2013 shows right hip has partial posterior labral   tear. Also has component of greater trochanteric bursitis and had corticosteroid injection with good relief in early July, 2012   . Thrombocytopenia, unspecified (HCC) 02/12/2013   Formatting of this note might be different from the original. 10/1 IMO update  . Type 2 diabetes mellitus with stage 4 chronic kidney disease (HCC) 06/03/2012  . Uncontrolled type 2 diabetes with renal manifestation (HCC) 10/16/2019  . Vitamin D deficiency     Past Surgical History:  Procedure Laterality Date  . BACK SURGERY      Family History  Problem Relation Age of Onset  . Diabetes Mother   . Diabetes Brother   . Diabetes Son     Social  History   Socioeconomic History  . Marital status: Widowed    Spouse name: Not on file  . Number of children: Not on file  . Years of education: Not on file  . Highest education level: Not on file  Occupational History  . Occupation: retired  Tobacco Use  . Smoking status: Former Smoker    Quit date: 09/08/1979    Years since quitting: 40.9  . Smokeless tobacco: Never Used  Substance and Sexual Activity  . Alcohol use: No  . Drug use: No  . Sexual activity: Never  Other Topics Concern  . Not on file  Social History Narrative  . Not on file   Social Determinants of Health   Financial Resource Strain: Not on file  Food Insecurity: Not on file  Transportation Needs: Not on file  Physical Activity: Not on file  Stress: Not on file  Social Connections: Not on file  Intimate Partner Violence: Not on file    Outpatient Medications Prior to Visit  Medication Sig Dispense Refill  . ACCUPRIL 20 MG tablet TAKE 1 TABLET AT BEDTIME 90 tablet 3  . acetaminophen (TYLENOL) 325 MG tablet Take 650 mg by mouth as needed.    . amLODipine (NORVASC) 5 MG tablet Take 1 tablet (5 mg total) by mouth daily. 90 tablet 3  . colchicine 0.6 MG tablet Take 1 tablet (0.6 mg total) by mouth daily as needed. 1 tablet 0  . Continuous Blood Gluc Sensor (FREESTYLE LIBRE 2 SENSOR) MISC 14 day sensor. Dx:E11.9 6 each 11  . diazepam (VALIUM) 10 MG tablet Take 10 mg by mouth as needed.    . glipiZIDE (GLUCOTROL) 5 MG tablet Take 1 tablet (5 mg total) by mouth 2 (two) times daily. 180 tablet 3  . HUMULIN 70/30 KWIKPEN (70-30) 100 UNIT/ML KwikPen INJECT 34 UNITS IN THE MORNING AND 32 UNITS IN THE EVENING 45 mL 4  . isosorbide mononitrate (IMDUR) 120 MG 24 hr tablet Take 1 tablet (120 mg total) by mouth daily. 90 tablet 1  . levothyroxine (SYNTHROID) 50 MCG tablet TAKE 1 TABLET TWO TIMES A WEEK. ON SUNDAY AND WEDNESDAY. ALTERNATING WITH 75 MCG THE OTHER 5 DAYS 90 tablet 3  . levothyroxine (SYNTHROID) 75 MCG tablet  Take 1 tablet (75 mcg total) by mouth daily. 90 tablet 3  . liraglutide (VICTOZA) 18 MG/3ML SOPN Inject 0.2 mLs (1.2 mg total) into the skin daily. 18 mL 3  . metoprolol tartrate (LOPRESSOR) 25 MG tablet Take 1 tablet (25 mg total) by mouth 2 (two) times daily. 180 tablet 3  . montelukast (SINGULAIR) 10 MG tablet Take 1 tablet (10 mg total) by mouth at bedtime. 90 tablet 3  . NARCAN 4 MG/0.1ML LIQD nasal spray kit CALL 911. ADMINISTER A SINGLE SPRAY OF NARCAN IN ONE NOSTRIL.   REPEAT EVERY 3 MINUTES AS NEEDED IF NO OR MINIMAL RESPONSE.  0  . nitroGLYCERIN (NITROSTAT) 0.4 MG SL tablet Place under the tongue.    . Oxycodone HCl 10 MG TABS     . polyethylene glycol (MIRALAX / GLYCOLAX) packet Take 17 g by mouth as needed.    . pravastatin (PRAVACHOL) 20 MG tablet Take 1 tablet (20 mg total) by mouth every evening. 90 tablet 3  . PROAIR HFA 108 (90 Base) MCG/ACT inhaler USE 2 INHALATIONS EVERY 6 HOURS AS NEEDED 51 g 3  . tolterodine (DETROL LA) 4 MG 24 hr capsule Take 1 capsule (4 mg total) by mouth daily. 90 capsule 4  . XTAMPZA ER 18 MG C12A Take 1 capsule by mouth 2 (two) times daily.    . terconazole (TERAZOL 7) 0.4 % vaginal cream Place 1 applicator vaginally at bedtime. 45 g 1   No facility-administered medications prior to visit.    Allergies  Allergen Reactions  . Amoxicillin Diarrhea and Itching    Other reaction(s): Confusion (intolerance)  . Atorvastatin Other (See Comments)    Burning skin sensation  . Hydrochlorothiazide     Other reaction(s): Other Chronic renal failure  . Nsaids Other (See Comments)    Peptic ulcer disease  . Tramadol Nausea And Vomiting    Other reaction(s): GI Upset (intolerance)  . Allopurinol Other (See Comments)    Makes her stomach hurt  . Morphine Other (See Comments)    Hallucinations  . Omeprazole Diarrhea  . Pepcid [Famotidine] Other (See Comments)    Cause her to become gasy  . Trulicity [Dulaglutide] Other (See Comments)  . Ciprofloxacin      Other reaction(s): GI Upset (intolerance) Other reaction(s): Abdominal Pain Other reaction(s): Abdominal Pain  . Gabapentin Nausea Only    Feels "stupid" and wt gain   . Pantoprazole Diarrhea    ROS Review of Systems    Objective:    Physical Exam  BP (!) 116/55   Pulse 88   Ht 5' 4" (1.626 m)   Wt 166 lb (75.3 kg)   SpO2 99%   BMI 28.49 kg/m  Wt Readings from Last 3 Encounters:  08/23/20 166 lb (75.3 kg)  07/06/20 165 lb (74.8 kg)  06/30/20 165 lb (74.8 kg)     There are no preventive care reminders to display for this patient.  There are no preventive care reminders to display for this patient.  Lab Results  Component Value Date   TSH 2.55 05/26/2020   Lab Results  Component Value Date   WBC 7.9 07/14/2019   HGB 10.4 (L) 07/14/2019   HCT 33.6 (L) 07/14/2019   MCV 79 07/14/2019   PLT 136 (L) 07/14/2019   Lab Results  Component Value Date   NA 138 05/26/2020   K 4.7 05/26/2020   CO2 20 05/26/2020   GLUCOSE 202 (H) 05/26/2020   BUN 24 05/26/2020   CREATININE 2.05 (H) 05/26/2020   BILITOT 0.4 06/26/2019   ALKPHOS 57 11/28/2016   AST 80 (H) 06/26/2019   ALT 59 (H) 06/26/2019   PROT 7.6 06/26/2019   ALBUMIN 4.3 11/28/2016   CALCIUM 9.4 05/26/2020   Lab Results  Component Value Date   CHOL 135 09/25/2019   Lab Results  Component Value Date   HDL 50 09/25/2019   Lab Results  Component Value Date   LDLCALC 45 09/25/2019   Lab Results  Component Value Date   TRIG 262 (H) 09/25/2019   Lab Results    Component Value Date   CHOLHDL 2.7 09/25/2019   Lab Results  Component Value Date   HGBA1C 8.0 (A) 08/23/2020      Assessment & Plan:   Problem List Items Addressed This Visit      Cardiovascular and Mediastinum   HTN (hypertension)    Well controlled. Continue current regimen. Follow up in  3-67mo        Endocrine   Diabetes mellitus without complication ([place] - Primary    Uncontrolled. A1C at 8.0 today.  Still only eating once or  twice a day most times.  She is also been giving her 70/30 insulin after she eats instead of before her meal.  And then sometimes still having occasional hypoglycemia in the middle the night.  Depending on her next A1c will consider taking her long-acting and mealtime apart separately this might allow her to have a little bit more flexibility.  Doing well with Victoza.  Continue current regimen of 1.2 mg daily.      Relevant Orders   POCT glycosylated hemoglobin (Hb A1C) (Completed)     Genitourinary   CKD (chronic kidney disease) stage 4, GFR 15-29 ml/min (HCC)   Relevant Orders   BASIC METABOLIC PANEL WITH GFR    Other Visit Diagnoses    Vaginal burning         Vaginal burning-discussed that it may just be dryness causing her symptoms.  Did offer to do a wet prep today but she declined and will just let [place]now if symptoms persist.  No orders of the defined types were placed in this encounter.   Follow-up: Return in about 3 months (around 11/23/2020) for Diabetes follow-up.    CBeatrice Lecher MD

## 2020-08-23 NOTE — Assessment & Plan Note (Addendum)
Uncontrolled. A1C at 8.0 today.  Still only eating once or twice a day most times.  She is also been giving her 70/30 insulin after she eats instead of before her meal.  And then sometimes still having occasional hypoglycemia in the middle the night.  Depending on her next A1c will consider taking her long-acting and mealtime apart separately this might allow her to have a little bit more flexibility.  Doing well with Victoza.  Continue current regimen of 1.2 mg daily.

## 2020-08-23 NOTE — Telephone Encounter (Signed)
Is call patient I think she is coming up due for refill on her insulin soon I really would like to switch it to 1 long-acting shot at bedtime and then 1 short acting shot before each meal instead of the combination one that she has been doing especially since sometimes she only eats once or twice a day I just think this would allow her to be more consistent without having lows in the middle the night if she is okay with the change then please let me know.

## 2020-08-24 ENCOUNTER — Ambulatory Visit: Payer: Medicare Other | Admitting: Cardiology

## 2020-08-25 NOTE — Telephone Encounter (Signed)
Patient's daughter said she would be ok with switching.

## 2020-08-28 ENCOUNTER — Other Ambulatory Visit: Payer: Self-pay | Admitting: Family Medicine

## 2020-08-29 MED ORDER — LANTUS SOLOSTAR 100 UNIT/ML ~~LOC~~ SOPN: 15.0000 [IU] | PEN_INJECTOR | Freq: Every day | SUBCUTANEOUS | 99 refills | Status: DC

## 2020-08-29 MED ORDER — NOVOLOG FLEXPEN 100 UNIT/ML ~~LOC~~ SOPN: 5.0000 [IU] | PEN_INJECTOR | Freq: Three times a day (TID) | SUBCUTANEOUS | 3 refills | Status: DC

## 2020-08-29 NOTE — Telephone Encounter (Signed)
Will change to Lantus starting with 15 units at bedtime and NovoLog for mealtime insulin using the flex pen.  Since a lot of time she only eats once or twice a day I think trying to do the 70/30 twice a day has been a difficult stretch and then she ends up with hypoglycemia in the middle the night.

## 2020-09-18 ENCOUNTER — Encounter: Payer: Self-pay | Admitting: Family Medicine

## 2020-09-22 ENCOUNTER — Telehealth: Payer: Self-pay | Admitting: *Deleted

## 2020-09-22 ENCOUNTER — Other Ambulatory Visit: Payer: Self-pay | Admitting: Family Medicine

## 2020-09-22 LAB — BASIC METABOLIC PANEL WITH GFR
BUN/Creatinine Ratio: 17 (calc) (ref 6–22)
BUN: 39 mg/dL — ABNORMAL HIGH (ref 7–25)
CO2: 20 mmol/L (ref 20–32)
Calcium: 9.4 mg/dL (ref 8.6–10.4)
Chloride: 101 mmol/L (ref 98–110)
Creat: 2.23 mg/dL — ABNORMAL HIGH (ref 0.60–0.93)
GFR, Est African American: 24 mL/min/{1.73_m2} — ABNORMAL LOW (ref >=60)
GFR, Est Non African American: 21 mL/min/{1.73_m2} — ABNORMAL LOW (ref >=60)
Glucose, Bld: 201 mg/dL — ABNORMAL HIGH (ref 65–99)
Potassium: 5.8 mmol/L — ABNORMAL HIGH (ref 3.5–5.3)
Sodium: 135 mmol/L (ref 135–146)

## 2020-09-22 NOTE — Telephone Encounter (Signed)
Office notes faxed. Confirmation received.

## 2020-10-12 ENCOUNTER — Other Ambulatory Visit: Payer: Self-pay | Admitting: Family Medicine

## 2020-10-12 ENCOUNTER — Other Ambulatory Visit: Payer: Self-pay | Admitting: Cardiology

## 2020-10-13 NOTE — Telephone Encounter (Signed)
Refill sent to pharmacy.   

## 2020-10-18 ENCOUNTER — Other Ambulatory Visit: Payer: Self-pay

## 2020-10-18 ENCOUNTER — Ambulatory Visit (INDEPENDENT_AMBULATORY_CARE_PROVIDER_SITE_OTHER): Payer: Medicare Other | Admitting: Family Medicine

## 2020-10-18 VITALS — BP 119/59 | HR 86 | Ht 64.0 in | Wt 165.0 lb

## 2020-10-18 DIAGNOSIS — Z780 Asymptomatic menopausal state: Secondary | ICD-10-CM | POA: Diagnosis not present

## 2020-10-18 DIAGNOSIS — Z0000 Encounter for general adult medical examination without abnormal findings: Secondary | ICD-10-CM | POA: Diagnosis not present

## 2020-10-18 NOTE — Patient Instructions (Addendum)
[place] Maintenance Summary and Written Plan of Care  [name] ,  Thank you for allowing me to perform your Medicare Annual Wellness Visit and for your ongoing commitment to your health.   Health Maintenance & Immunization History Health Maintenance  Topic Date Due   COVID-19 Vaccine (4 - Booster for Moderna series) 11/03/2020 (Originally 06/06/2020)   DEXA SCAN  10/18/2021 (Originally 09/29/2020)   INFLUENZA VACCINE  11/21/2020   HEMOGLOBIN A1C  02/23/2021   FOOT EXAM  05/26/2021   OPHTHALMOLOGY EXAM  06/15/2021   TETANUS/TDAP  04/30/2027   Hepatitis C Screening  Completed   PNA vac Low Risk Adult  Completed   Zoster Vaccines- Shingrix  Completed   HPV VACCINES  Aged Out   Immunization History  Administered Date(s) Administered   Influenza Split 03/02/2014   Influenza, High Dose Seasonal PF 01/18/2015, 01/05/2016, 01/09/2019, 01/18/2020   Influenza,inj,Quad PF,6+ Mos 02/03/2018   Influenza,trivalent, recombinat, inj, PF 02/05/2012, 01/21/2013   Influenza-Unspecified 02/21/2013, 01/29/2017, 02/03/2018   Moderna Sars-Covid-2 Vaccination 05/25/2019, 06/22/2019, 02/04/2020   Pneumococcal Conjugate-13 05/05/2013   Pneumococcal Polysaccharide-23 09/15/2009   Tdap 04/29/2017   Zoster Recombinat (Shingrix) 03/05/2019, 07/21/2019   Zoster, Live 04/23/2006    These are the patient goals that we discussed:  Goals Addressed               This Visit's Progress     Patient Stated (pt-stated)        10/18/2020 AWV Goal: Diabetes Management  Patient will maintain an A1C level below 7.5 Patient will not develop any diabetic foot complications Patient will not experience any hypoglycemic episodes over the next 3 months Patient will notify our office of any CBG readings outside of the provider recommended range by calling [phone] Patient will adhere to provider recommendations for diabetes management  Patient Self Management Activities take  all medications as prescribed and report any negative side effects monitor and record blood sugar readings as directed adhere to a low carbohydrate diet that incorporates lean proteins, vegetables, whole grains, low glycemic fruits check feet daily noting any sores, cracks, injuries, or callous formations see PCP or podiatrist if she notices any changes in her legs, feet, or toenails Patient will visit PCP and have an A1C level checked every 3 to 6 months as directed  have a yearly eye exam to monitor for vascular changes associated with diabetes and will request that the report be sent to her pcp.  consult with her PCP regarding any changes in her health or new or worsening symptoms           This is a list of Health Maintenance Items that are overdue or due now: Bone densitometry screening  Orders/Referrals Placed Today: Orders Placed This Encounter  Procedures   DEXAScan    Standing Status:   Future    Standing Expiration Date:   10/18/2021    Scheduling Instructions:     Please call patient to schedule.    Order Specific Question:   Reason for exam:    Answer:   post menopausal    Order Specific Question:   Preferred imaging location?    Answer:   [facility]   (Contact our referral department at [phone] if you have not spoken with someone about your referral appointment within the next 5 days)    Follow-up Plan Follow-up with [name], MD as planned Dexa scan referral was sent and they will call you to schedule.  Medicare wellness  in one year.  Patient stated that she will look at her AVS online.

## 2020-10-18 NOTE — Progress Notes (Signed)
MEDICARE ANNUAL WELLNESS VISIT  10/18/2020  Telephone Visit Disclaimer This Medicare AWV was conducted by telephone due to national recommendations for restrictions regarding the COVID-19 Pandemic (e.g. social distancing).  I verified, using two identifiers, that I am speaking with Barbara Yu or their authorized healthcare agent. I discussed the limitations, risks, security, and privacy concerns of performing an evaluation and management service by telephone and the potential availability of an in-person appointment in the future. The patient expressed understanding and agreed to proceed.  Location of Patient: Home Location of Provider (nurse):  In the office.  Subjective:    Barbara Yu is a 75 y.o. female patient of Barbara Yu, Barbara Kocher, MD who had a Medicare Annual Wellness Visit today via telephone. Barbara Yu is Retired and lives alone. she has 5 children. she reports that she is socially active and does interact with friends/family regularly. she is minimally physically active and enjoys watching tv.  Patient Care Team: [name], MD as PCP - General (Family Medicine) Park Liter, MD as PCP - Cardiology (Cardiology) Clent Jacks, MD as Referring Physician (Nephrology) Despina Pole, MD as Referring Physician (Obstetrics and Gynecology) Jed Limerick DPM as Referring Physician (Podiatry) Raina Mina, MD as Referring Physician (Nephrology)  Advanced Directives 10/18/2020 10/03/2016  Does Patient Have a Medical Advance Directive? No -  Would patient like information on creating a medical advance directive? No - Patient declined Yes (MAU/Ambulatory/Procedural Areas - Information given)    Hospital Utilization Over the Past 12 Months: # of hospitalizations or ER visits: 2 # of surgeries: 1  Review of Systems    Patient reports that her overall health is unchanged compared to last year.  History obtained from chart review and the  patient  Patient Reported Readings (BP, Pulse, CBG, Weight, etc) Weight 165 lb Height 40fin BP 119/59 Pulse 86   Pain Assessment Pain : 0-10 Pain Score: 9  Pain Type: Chronic pain Pain Location: Back Pain Orientation: Lower Pain Descriptors / Indicators: Sharp, Aching Pain Onset: More than a month ago Pain Frequency: Intermittent Pain Relieving Factors: rest  Pain Relieving Factors: rest  Current Medications & Allergies (verified) Allergies as of 10/18/2020       Reactions   Amoxicillin Diarrhea, Itching   Other reaction(s): Confusion (intolerance)   Atorvastatin Other (See Comments)   Burning skin sensation   Hydrochlorothiazide    Other reaction(s): Other Chronic renal failure   Nsaids Other (See Comments)   Peptic ulcer disease   Tramadol Nausea And Vomiting   Other reaction(s): GI Upset (intolerance)   Allopurinol Other (See Comments)   Makes her stomach hurt   Morphine Other (See Comments)   Hallucinations   Omeprazole Diarrhea   Pepcid [famotidine] Other (See Comments)   Cause her to become gasy   Trulicity [dulaglutide] Other (See Comments)   Ciprofloxacin    Other reaction(s): GI Upset (intolerance) Other reaction(s): Abdominal Pain Other reaction(s): Abdominal Pain   Gabapentin Nausea Only   Feels "stupid" and wt gain    Pantoprazole Diarrhea        Medication List        Accurate as of October 18, 2020  2:27 PM. If you have any questions, ask your nurse or doctor.          Accupril 20 MG tablet Generic drug: quinapril TAKE 1 TABLET AT BEDTIME   acetaminophen 325 MG tablet Commonly known as: TYLENOL Take 650 mg by mouth as needed.   amLODipine 5 MG  tablet Commonly known as: NORVASC TAKE 1 TABLET DAILY   colchicine 0.6 MG tablet Take 1 tablet (0.6 mg total) by mouth daily as needed.   FreeStyle Libre 2 Sensor Misc 14 day sensor. Dx:E11.9   glipiZIDE 5 MG tablet Commonly known as: GLUCOTROL Take 1 tablet (5 mg total) by mouth 2  (two) times daily.   isosorbide mononitrate 120 MG 24 hr tablet Commonly known as: IMDUR Take 1 tablet (120 mg total) by mouth daily.   Lantus SoloStar 100 UNIT/ML Solostar Pen Generic drug: insulin glargine Inject 15 Units into the skin at bedtime.   levothyroxine 75 MCG tablet Commonly known as: SYNTHROID Take 1 tablet (75 mcg total) by mouth daily.   Synthroid 50 MCG tablet Generic drug: levothyroxine TAKE 1 TABLET TWO TIMES A WEEK. ON SUNDAY AND WEDNESDAY. ALTERNATING WITH 75 MCG THE OTHER 5 DAYS   metoprolol tartrate 25 MG tablet Commonly known as: LOPRESSOR Take 1 tablet (25 mg total) by mouth 2 (two) times daily.   montelukast 10 MG tablet Commonly known as: SINGULAIR Take 1 tablet (10 mg total) by mouth at bedtime.   Narcan 4 MG/0.1ML Liqd nasal spray kit Generic drug: naloxone CALL 911. ADMINISTER A SINGLE SPRAY OF NARCAN IN ONE NOSTRIL. REPEAT EVERY 3 MINUTES AS NEEDED IF NO OR MINIMAL RESPONSE.   nitroGLYCERIN 0.4 MG SL tablet Commonly known as: NITROSTAT DISSOLVE 1 TABLET UNDER THE TONGUE EVERY 5 MINUTES AS NEEDED   NovoLOG FlexPen 100 UNIT/ML FlexPen Generic drug: insulin aspart Inject 5-10 Units into the skin 3 (three) times daily before meals.   Oxycodone HCl 10 MG Tabs   polyethylene glycol 17 g packet Commonly known as: MIRALAX / GLYCOLAX Take 17 g by mouth as needed.   pravastatin 20 MG tablet Commonly known as: PRAVACHOL TAKE 1 TABLET EVERY EVENING   ProAir HFA 108 (90 Base) MCG/ACT inhaler Generic drug: albuterol USE 2 INHALATIONS EVERY 6 HOURS AS NEEDED   tolterodine 4 MG 24 hr capsule Commonly known as: Detrol LA Take 1 capsule (4 mg total) by mouth daily.   Victoza 18 MG/3ML Sopn Generic drug: liraglutide Inject 0.2 mLs (1.2 mg total) into the skin daily.   Xtampza ER 18 MG C12a Generic drug: oxyCODONE ER Take 1 capsule by mouth 2 (two) times daily.        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  [facility]    Gastro-esophageal reflux disease without esophagitis 06/03/2012   Gout 10/03/2016   HTN (hypertension) 06/03/2012   Hypercalcemia 03/08/2020   Hyperkalemia, diminished renal excretion 01/05/2019   Hypertension    Hypothyroidism 06/03/2012   Iron deficiency anemia 10/02/2016   Lumbar pseudoarthrosis 06/28/2016   Formatting of this note might be different from the original. Added automatically from request for surgery [id number]   Memory deficit 06/27/2018   MMSE 06/2017: 27/30. Pass is 28.    Nonspecific abnormal electrocardiogram (ECG) (EKG) 07/14/2019   Normal cardiac stress test  06/09/2012   at [facility] normal cardiac stress test (surgery preop); Dr Norman Clay   Osteoarthritis 10/02/2016   Pain in right hip 01/03/2012   Formatting of this note might be different from the original. Overview:  Right greater than left. MRI done 2013 shows right hip has partial posterior labral tear. Also has component of greater trochanteric bursitis and had corticosteroid injection with good relief in early July, 2012   Peripheral neuropathy 10/02/2016   Precordial chest pain 07/14/2019   Primary osteoarthritis of both hips 01/03/2012   Right greater than left. MRI done 2013 shows right hip has partial posterior labral tear. Also has component of greater trochanteric bursitis and had corticosteroid injection with good relief in early July, 2012    Thrombocytopenia, unspecified ([place]) 02/12/2013   Formatting  of this note might be different from the original. 10/1 IMO update   Type 2 diabetes mellitus with stage 4 chronic kidney disease ([place]) 06/03/2012   Uncontrolled type 2 diabetes with renal manifestation ([place]) 10/16/2019   Vitamin D deficiency    Past Surgical History:  Procedure Laterality Date   BACK SURGERY     Family History  Problem Relation Age of Onset   Diabetes Mother    Diabetes Brother    Diabetes Son    Social History   Socioeconomic History   Marital status: Widowed    Spouse name: Not on file   Number of children: 5   Years of education: 14   Highest education level: Associate degree: occupational, Hotel manager, or vocational program  Occupational History   Occupation: retired  Tobacco Use   Smoking status: Former    Pack years: 0.00    Types: Cigarettes    Quit date: 09/08/1979    Years since quitting: 41.1   Smokeless tobacco: Never  Substance and Sexual Activity   Alcohol use: No   Drug use: No   Sexual activity: Never  Other Topics Concern   Not on file  Social History Narrative   Lives alone. She has five children. She enjoys watching t.v.   Social Determinants of Health   Financial Resource Strain: Low Risk    Difficulty of Paying Living Expenses: Not hard at all  Food Insecurity: No Food Insecurity   Worried About Charity fundraiser in the Last Year: Never true   Arboriculturist in the Last Year: Never true  Transportation Needs: No Transportation Needs   Lack of Transportation (Medical): No   Lack of Transportation (Non-Medical): No  Physical Activity: Inactive   Days of Exercise per Week: 0 days   Minutes of Exercise per Session: 0 min  Stress: No Stress Concern Present   Feeling of Stress : Only a little  Social Connections: Moderately Integrated   Frequency of Communication with Friends and Family: More than three times a week   Frequency of Social Gatherings with Friends and Family: Three times a week   Attends Religious Services: More  than 4 times per year   Active Member of Clubs or Organizations: Yes   Attends Archivist Meetings: More than 4 times per year   Marital Status: Widowed    Activities of Daily Living In your present state of health, do you have any difficulty performing the following activities: 10/18/2020  Hearing? N  Vision? N  Difficulty concentrating or making decisions? N  Walking or climbing stairs? Y  Comment back pain  Dressing or bathing? N  Doing errands, shopping? N  Conservation officer, nature and  eating ? N  Using the Toilet? N  In the past six months, have you accidently leaked urine? N  Do you have problems with loss of bowel control? Y  Comment she wears a pad  Managing your Medications? N  Managing your Finances? N  Housekeeping or managing your Housekeeping? N  Some recent data might be hidden    Patient Education/ Literacy How often do you need to have someone help you when you read instructions, pamphlets, or other written materials from your doctor or pharmacy?: 1 - Never What is the last grade level you completed in school?: Associates degree  Exercise Current Exercise Habits: The patient does not participate in regular exercise at present, Exercise limited by: orthopedic condition(s)  Diet Patient reports consuming  1-2  meals a day and 2 snack(s) a day Patient reports that her primary diet is: Regular Patient reports that she does have regular access to food.   Depression Screen PHQ 2/9 Scores 10/18/2020 08/23/2020 02/23/2019 10/07/2018 06/27/2018 08/19/2017 01/16/2017  PHQ - 2 Score 0 0 0 0 1 1 0  PHQ- 9 Score - - - - 8 - -     Fall Risk Fall Risk  10/18/2020 08/23/2020 05/26/2020 06/26/2019 02/23/2019  Falls in the past year? 0 _0 0  Number falls in past yr: 0 0 0 0 0  Injury with Fall? 0 0 0 0 0  Risk for fall due to : No Fall Risks Impaired balance/gait No Fall Risks Other (Comment) -  Risk for fall due to: Comment - - - pt was moving from chair to couch and began to loose  her balance -  Follow up Falls evaluation completed Falls prevention discussed;Falls evaluation completed - Falls prevention discussed -     Objective:  Raquel Racey seemed alert and oriented and she participated appropriately during our telephone visit.  Blood Pressure Weight BMI  BP Readings from Last 3 Encounters:  08/23/20 (!) 116/55  06/30/20 134/74  05/30/20 105/63   Wt Readings from Last 3 Encounters:  08/23/20 166 lb (75.3 kg)  07/06/20 165 lb (74.8 kg)  06/30/20 165 lb (74.8 kg)   BMI Readings from Last 1 Encounters:  08/23/20 28.49 kg/m    *Unable to obtain current vital signs, weight, and BMI due to telephone visit type  Hearing/Vision  Arriel did not seem to have difficulty with hearing/understanding during the telephone conversation Reports that she has had a formal eye exam by an eye care professional within the past year Reports that she has not had a formal hearing evaluation within the past year *Unable to fully assess hearing and vision during telephone visit type  Cognitive Function: 6CIT Screen 10/18/2020  What Year? 0 points  What month? 0 points  What time? 0 points  Count back from 20 0 points  Months in reverse 0 points  Repeat phrase 2 points  Total Score 2   (Normal:0-7, Significant for Dysfunction: >8)  Normal Cognitive Function Screening: Yes   Immunization & Health Maintenance Record Immunization History  Administered Date(s) Administered   Influenza Split 03/02/2014   Influenza, High Dose Seasonal PF 01/18/2015, 01/05/2016, 01/09/2019, 01/18/2020   Influenza,inj,Quad PF,6+ Mos 02/03/2018   Influenza,trivalent, recombinat, inj, PF 02/05/2012, 01/21/2013   Influenza-Unspecified 02/21/2013, 01/29/2017, 02/03/2018   Moderna Sars-Covid-2 Vaccination 05/25/2019, 06/22/2019, 02/04/2020   Pneumococcal Conjugate-13 05/05/2013   Pneumococcal Polysaccharide-23 09/15/2009   Tdap 04/29/2017   Zoster Recombinat (Shingrix) 03/05/2019    Zoster, Live 04/23/2006    Health  Maintenance  Topic Date Due   COVID-19 Vaccine (4 - Booster for Moderna series) 11/03/2020 (Originally 06/06/2020)   Zoster Vaccines- Shingrix (2 of 2) 01/18/2021 (Originally 04/30/2019)   DEXA SCAN  10/18/2021 (Originally 09/29/2020)   INFLUENZA VACCINE  11/21/2020   HEMOGLOBIN A1C  02/23/2021   FOOT EXAM  05/26/2021   OPHTHALMOLOGY EXAM  06/15/2021   TETANUS/TDAP  04/30/2027   Hepatitis C Screening  Completed   PNA vac Low Risk Adult  Completed   HPV VACCINES  Aged Out       Assessment  This is a routine wellness examination for Barbara Yu.  Health Maintenance: Due or Overdue There are no preventive care reminders to display for this patient.   Allona Gondek does not need a referral for Community Assistance: Care Management:   no Social Work:    no Prescription Assistance:  no Nutrition/Diabetes Education:  no   Plan:  Personalized Goals  Goals Addressed               This Visit's Progress     Patient Stated (pt-stated)        10/18/2020 AWV Goal: Diabetes Management  Patient will maintain an A1C level below 7.5 Patient will not develop any diabetic foot complications Patient will not experience any hypoglycemic episodes over the next 3 months Patient will notify our office of any CBG readings outside of the provider recommended range by calling [phone] Patient will adhere to provider recommendations for diabetes management  Patient Self Management Activities take all medications as prescribed and report any negative side effects monitor and record blood sugar readings as directed adhere to a low carbohydrate diet that incorporates lean proteins, vegetables, whole grains, low glycemic fruits check feet daily noting any sores, cracks, injuries, or callous formations see PCP or podiatrist if she notices any changes in her legs, feet, or toenails Patient will visit PCP and have an A1C level checked every 3 to 6  months as directed  have a yearly eye exam to monitor for vascular changes associated with diabetes and will request that the report be sent to her pcp.  consult with her PCP regarding any changes in her health or new or worsening symptoms         Personalized Health Maintenance & Screening Recommendations  Bone densitometry screening  Lung Cancer Screening Recommended: no (Low Dose CT Chest recommended if Age 78-80 years, 30 pack-year currently smoking OR have quit w/in past 15 years) Hepatitis C Screening recommended: no HIV Screening recommended: no  Advanced Directives: Written information was not prepared per patient's request.  Referrals & Orders No orders of the defined types were placed in this encounter.   Follow-up Plan Follow-up with [name], MD as planned Dexa scan referral was sent and they will call you to schedule.  Medicare wellness in one year.  Patient stated that she will look at her AVS online.   I have personally reviewed and noted the following in the patient's chart:   Medical and social history Use of alcohol, tobacco or illicit drugs  Current medications and supplements Functional ability and status Nutritional status Physical activity Advanced directives List of other physicians Hospitalizations, surgeries, and ER visits in previous 12 months Vitals Screenings to include cognitive, depression, and falls Referrals and appointments  In addition, I have reviewed and discussed with Barbara Yu certain preventive protocols, quality metrics, and best practice recommendations. A written personalized care plan for preventive services as well as general preventive health  recommendations is available and can be mailed to the patient at her request.      Tinnie Gens, RN  10/18/2020

## 2020-10-20 ENCOUNTER — Other Ambulatory Visit: Payer: Self-pay | Admitting: Cardiology

## 2020-11-08 ENCOUNTER — Other Ambulatory Visit: Payer: Self-pay | Admitting: Family Medicine

## 2020-11-24 ENCOUNTER — Ambulatory Visit: Payer: Medicare Other | Admitting: Family Medicine

## 2020-12-08 ENCOUNTER — Other Ambulatory Visit: Payer: Self-pay | Admitting: Family Medicine

## 2020-12-08 ENCOUNTER — Encounter: Payer: Self-pay | Admitting: Family Medicine

## 2020-12-08 DIAGNOSIS — R350 Frequency of micturition: Secondary | ICD-10-CM

## 2020-12-09 MED ORDER — TOLTERODINE TARTRATE ER 4 MG PO CP24: 4.0000 mg | ORAL_CAPSULE | Freq: Every day | ORAL | 0 refills | Status: DC

## 2020-12-20 ENCOUNTER — Other Ambulatory Visit: Payer: Self-pay | Admitting: Family Medicine

## 2020-12-20 DIAGNOSIS — E119 Type 2 diabetes mellitus without complications: Secondary | ICD-10-CM

## 2021-01-03 ENCOUNTER — Other Ambulatory Visit: Payer: Self-pay

## 2021-01-03 ENCOUNTER — Ambulatory Visit (INDEPENDENT_AMBULATORY_CARE_PROVIDER_SITE_OTHER): Payer: Medicare Other | Admitting: Cardiology

## 2021-01-03 ENCOUNTER — Encounter: Payer: Self-pay | Admitting: Cardiology

## 2021-01-03 VITALS — BP 124/76 | HR 65 | Ht 64.0 in | Wt 163.0 lb

## 2021-01-03 DIAGNOSIS — I10 Essential (primary) hypertension: Secondary | ICD-10-CM

## 2021-01-03 DIAGNOSIS — I209 Angina pectoris, unspecified: Secondary | ICD-10-CM | POA: Diagnosis not present

## 2021-01-03 DIAGNOSIS — E1122 Type 2 diabetes mellitus with diabetic chronic kidney disease: Secondary | ICD-10-CM

## 2021-01-03 DIAGNOSIS — N184 Chronic kidney disease, stage 4 (severe): Secondary | ICD-10-CM

## 2021-01-03 DIAGNOSIS — Z794 Long term (current) use of insulin: Secondary | ICD-10-CM

## 2021-01-03 NOTE — Patient Instructions (Signed)

## 2021-01-03 NOTE — Progress Notes (Signed)
Cardiology Office Note:    Date:  01/03/2021   ID:  [name], DOB 1945/10/29, MRN [id number]  PCP:  [name], MD  Cardiologist:  [name], MD    Referring MD: [name], *   No chief complaint on file. Doing fine  History of Present Illness:    Barbara Yu is a 75 y.o. female with past medical history significant for typical angina pectoris.  Initially we talked about potentially doing cardiac catheterization but because of kidney dysfunction we elected not to.  She was managed medically with quite good success.  Then eventually end up doing stress test which showed no evidence of ischemia.  She presented in the spring of this year to me because she required hysterectomy she wants to have evaluation before that surgery.  She was still experiencing some angina at the same time her ability to exercise was very limited, therefore, we could not assess her risk before that surgery.  We end up doing another stress test which showed no evidence of ischemia.  She went to surgery with no difficulties. She comes today 2 months of follow-up she described 1 episode of chest pain that happened when she was talking to her family that happened about a month ago she had to take 3 nitroglycerin before pain subsided otherwise she seems to be doing well.  She have difficulty walking up and moving around because of chronic back problem.  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  [facility]    Gastro-esophageal reflux disease without esophagitis 06/03/2012   Gout 10/03/2016   HTN (hypertension) 06/03/2012   Hypercalcemia 03/08/2020   Hyperkalemia, diminished renal excretion 01/05/2019   Hypertension    Hypothyroidism 06/03/2012   Iron deficiency anemia 10/02/2016   Lumbar pseudoarthrosis 06/28/2016   Formatting of this note might be different from the original. Added automatically from request for surgery [id number]   Memory deficit 06/27/2018   MMSE 06/2017: 27/30. Pass is 28.    Nonspecific abnormal electrocardiogram (ECG) (EKG) 07/14/2019   Normal cardiac stress test 06/09/2012   at [facility] normal cardiac stress test (surgery preop); Dr Norman Clay   Osteoarthritis 10/02/2016   Pain in right hip 01/03/2012   Formatting of this note might be different from the original. Overview:  Right greater than left. MRI done 2013 shows right hip has partial posterior labral tear. Also has component of greater trochanteric bursitis and had corticosteroid injection with good relief in early July, 2012   Peripheral neuropathy 10/02/2016   Precordial chest pain 07/14/2019   Primary osteoarthritis of both hips 01/03/2012   Right greater than left. MRI done 2013 shows right hip has partial posterior labral tear. Also has component of greater trochanteric bursitis and had corticosteroid injection with good relief in early July, 2012    Thrombocytopenia, unspecified ([place]) 02/12/2013   Formatting of this note might be different from the original. 10/1 IMO update   Type 2 diabetes mellitus with stage  4 chronic kidney disease ([place]) 06/03/2012   Uncontrolled type 2 diabetes with renal manifestation ([place]) 10/16/2019   Vitamin D deficiency     Past Surgical History:  Procedure Laterality Date   BACK SURGERY     Fibroid removed      Current Medications: Current Meds  Medication Sig   ACCUPRIL 20 MG tablet TAKE 1 TABLET AT BEDTIME (Patient taking differently: Take 20 mg by  mouth at bedtime.)     Allergies:   Amoxicillin, Atorvastatin, Hydrochlorothiazide, Nsaids, Tramadol, Allopurinol, Morphine, Omeprazole, Pepcid [famotidine], Trulicity [dulaglutide], Ciprofloxacin, Gabapentin, and Pantoprazole   Social History   Socioeconomic History   Marital status: Widowed    Spouse name: Not on file   Number of children: 5   Years of education: 14   Highest education level: Associate degree: occupational, Hotel manager, or vocational program  Occupational History   Occupation: retired  Tobacco Use   Smoking status: Former    Types: Cigarettes    Quit date: 09/08/1979    Years since quitting: 41.3   Smokeless tobacco: Never  Substance and Sexual Activity   Alcohol use: No   Drug use: No   Sexual activity: Never  Other Topics Concern   Not on file  Social History Narrative   Lives alone. She has five children. She enjoys watching t.v.   Social Determinants of Health   Financial Resource Strain: Low Risk    Difficulty of Paying Living Expenses: Not hard at all  Food Insecurity: No Food Insecurity   Worried About Charity fundraiser in the Last Year: Never true   Arboriculturist in the Last Year: Never true  Transportation Needs: No Transportation Needs   Lack of Transportation (Medical): No   Lack of Transportation (Non-Medical): No  Physical Activity: Inactive   Days of Exercise per Week: 0 days   Minutes of Exercise per Session: 0 min  Stress: No Stress Concern Present   Feeling of Stress : Only a little  Social Connections: Moderately Integrated   Frequency of Communication with Friends and Family: More than three times a week   Frequency of Social Gatherings with Friends and Family: Three times a week   Attends Religious Services: More than 4 times per year   Active Member of Clubs or Organizations: Yes   Attends Archivist Meetings: More than 4 times per year   Marital Status: Widowed     Family History: The patient's family history  includes Diabetes in her brother, mother, and son. ROS:   Please see the history of present illness.    All 14 point review of systems negative except as described per history of present illness  EKGs/Labs/Other Studies Reviewed:      Recent Labs: 05/26/2020: TSH 2.55 09/21/2020: BUN 39; Creat 2.23; Potassium 5.8; Sodium 135  Recent Lipid Panel    Component Value Date/Time   CHOL 135 09/25/2019 1507   TRIG 262 (H) 09/25/2019 1507   HDL 50 09/25/2019 1507   CHOLHDL 2.7 09/25/2019 1507   CHOLHDL 3.3 02/23/2019 1356   LDLCALC 45 09/25/2019 1507   LDLCALC 72 02/23/2019 1356    Physical Exam:    VS:  There were no vitals taken for this visit.    Wt Readings from Last 3 Encounters:  10/18/20 165 lb (74.8 kg)  08/23/20 166 lb (75.3 kg)  07/06/20 165 lb (74.8 kg)     GEN:  Well nourished, well developed in no acute distress HEENT: Normal NECK: No  JVD; No carotid bruits LYMPHATICS: No lymphadenopathy CARDIAC: RRR, no murmurs, no rubs, no gallops RESPIRATORY:  Clear to auscultation without rales, wheezing or rhonchi  ABDOMEN: Soft, non-tender, non-distended MUSCULOSKELETAL:  No edema; No deformity  SKIN: Warm and dry LOWER EXTREMITIES: no swelling NEUROLOGIC:  Alert and oriented x 3 PSYCHIATRIC:  Normal affect   ASSESSMENT:    1. Angina pectoris ([place])   2. Primary hypertension   3. Type 2 diabetes mellitus with stage 4 chronic kidney disease, with long-term current use of insulin (HCC)    PLAN:    In order of problems listed above:  Chest pain suspicious for angina pectoris but so far to stress test negative.  She went to surgery of hysterectomy with no difficulties.  We will continue present management. Chronic kidney failure with latest creatinine of 2.23 this is from 09/21/2020 we will avoid nephrotoxic medications. Type 2 diabetes followed by antimedicine team last hemoglobin A1c is 8.0 which is high this is from Aug 23, 2020 she was told to take care of her diabetes  better. C did have hysterectomy done with no difficulties we will continue watching the situation.   Medication Adjustments/Labs and Tests Ordered: Current medicines are reviewed at length with the patient today.  Concerns regarding medicines are outlined above.  No orders of the defined types were placed in this encounter.  Medication changes: No orders of the defined types were placed in this encounter.   Signed, Park Liter, MD, [facility] 01/03/2021 1:32 PM    [place]

## 2021-01-23 ENCOUNTER — Other Ambulatory Visit: Payer: Self-pay | Admitting: Cardiology

## 2021-01-24 ENCOUNTER — Other Ambulatory Visit: Payer: Self-pay | Admitting: *Deleted

## 2021-01-24 DIAGNOSIS — J309 Allergic rhinitis, unspecified: Secondary | ICD-10-CM

## 2021-01-24 DIAGNOSIS — R350 Frequency of micturition: Secondary | ICD-10-CM

## 2021-01-24 DIAGNOSIS — E1122 Type 2 diabetes mellitus with diabetic chronic kidney disease: Secondary | ICD-10-CM

## 2021-01-24 DIAGNOSIS — Z794 Long term (current) use of insulin: Secondary | ICD-10-CM

## 2021-01-24 DIAGNOSIS — J209 Acute bronchitis, unspecified: Secondary | ICD-10-CM

## 2021-01-24 DIAGNOSIS — I10 Essential (primary) hypertension: Secondary | ICD-10-CM

## 2021-01-24 MED ORDER — TOLTERODINE TARTRATE ER 4 MG PO CP24: 4.0000 mg | ORAL_CAPSULE | Freq: Every day | ORAL | 3 refills | Status: AC

## 2021-01-24 MED ORDER — MONTELUKAST SODIUM 10 MG PO TABS: 10.0000 mg | ORAL_TABLET | Freq: Every day | ORAL | 3 refills | Status: DC

## 2021-01-24 MED ORDER — ALBUTEROL SULFATE HFA 108 (90 BASE) MCG/ACT IN AERS: 2.0000 | INHALATION_SPRAY | Freq: Four times a day (QID) | RESPIRATORY_TRACT | 3 refills | Status: AC | PRN

## 2021-01-24 MED ORDER — QUINAPRIL HCL 20 MG PO TABS: 20.0000 mg | ORAL_TABLET | Freq: Every day | ORAL | 3 refills | Status: DC

## 2021-01-24 MED ORDER — GLIPIZIDE 5 MG PO TABS: 5.0000 mg | ORAL_TABLET | Freq: Two times a day (BID) | ORAL | 3 refills | Status: DC

## 2021-01-24 MED ORDER — HUMULIN 70/30 KWIKPEN (70-30) 100 UNIT/ML ~~LOC~~ SUPN: PEN_INJECTOR | SUBCUTANEOUS | 4 refills | Status: DC

## 2021-01-24 NOTE — Progress Notes (Signed)
Prescription refill request from pt's pharmacy

## 2021-02-24 NOTE — Addendum Note (Signed)
Addended by: Beatrice Lecher D on: 02/24/2021 11:27 AM   Modules accepted: Level of Service

## 2021-03-21 ENCOUNTER — Telehealth: Payer: Self-pay

## 2021-03-21 NOTE — Telephone Encounter (Signed)
Medication: VICTOZA 18 MG/3ML SOPN Prior authorization determination received Medication has been approved Approval dates: 02/19/2021-04/22/2098  Patient aware via: [place] aware: Yes Provider aware via this encounter

## 2021-03-21 NOTE — Telephone Encounter (Signed)
Medication: VICTOZA 18 MG/3ML SOPN Prior authorization submitted via CoverMyMeds on 03/21/2021 PA submission pending

## 2021-05-03 ENCOUNTER — Encounter: Payer: Self-pay | Admitting: Family Medicine

## 2021-05-03 ENCOUNTER — Other Ambulatory Visit: Payer: Self-pay | Admitting: *Deleted

## 2021-05-03 MED ORDER — TRIAMCINOLONE 0.1 % CREAM:EUCERIN CREAM 1:1: 1.0000 "application " | TOPICAL_CREAM | Freq: Two times a day (BID) | CUTANEOUS | 1 refills | Status: DC | PRN

## 2021-05-17 ENCOUNTER — Other Ambulatory Visit: Payer: Self-pay | Admitting: Cardiology

## 2021-07-11 ENCOUNTER — Ambulatory Visit (INDEPENDENT_AMBULATORY_CARE_PROVIDER_SITE_OTHER): Payer: Medicare Other | Admitting: Cardiology

## 2021-07-11 ENCOUNTER — Encounter: Payer: Self-pay | Admitting: Cardiology

## 2021-07-11 ENCOUNTER — Other Ambulatory Visit: Payer: Self-pay

## 2021-07-11 VITALS — BP 110/62 | HR 93 | Ht 64.0 in | Wt 168.0 lb

## 2021-07-11 DIAGNOSIS — Z794 Long term (current) use of insulin: Secondary | ICD-10-CM

## 2021-07-11 DIAGNOSIS — I10 Essential (primary) hypertension: Secondary | ICD-10-CM | POA: Diagnosis not present

## 2021-07-11 DIAGNOSIS — E1122 Type 2 diabetes mellitus with diabetic chronic kidney disease: Secondary | ICD-10-CM | POA: Diagnosis not present

## 2021-07-11 DIAGNOSIS — I209 Angina pectoris, unspecified: Secondary | ICD-10-CM | POA: Diagnosis not present

## 2021-07-11 DIAGNOSIS — N184 Chronic kidney disease, stage 4 (severe): Secondary | ICD-10-CM | POA: Diagnosis not present

## 2021-07-11 NOTE — Addendum Note (Signed)
Addended by: Edwyna Shell I on: 07/11/2021 01:36 PM ? ? Modules accepted: Orders ? ?

## 2021-07-11 NOTE — Patient Instructions (Signed)
Medication Instructions:  ?Your physician recommends that you continue on your current medications as directed. Please refer to the Current Medication list given to you today.  ?*If you need a refill on your cardiac medications before your next appointment, please call your pharmacy* ? ? ?Lab Work: ?Your physician recommends that you return for lab work in:  ? ?Labs today: Direct LDL ? ?If you have labs (blood work) drawn today and your tests are completely normal, you will receive your results only by: ?MyChart Message (if you have MyChart) OR ?A paper copy in the mail ?If you have any lab test that is abnormal or we need to change your treatment, we will call you to review the results. ? ? ?Testing/Procedures: ?None ? ? ?Follow-Up: ?At [facility], you and your health needs are our priority.  As part of our continuing mission to provide you with exceptional heart care, we have created designated Provider Care Teams.  These Care Teams include your primary Cardiologist (physician) and Advanced Practice Providers (APPs -  Physician Assistants and Nurse Practitioners) who all work together to provide you with the care you need, when you need it. ? ?We recommend signing up for the patient portal called "MyChart".  Sign up information is provided on this After Visit Summary.  MyChart is used to connect with patients for Virtual Visits (Telemedicine).  Patients are able to view lab/test results, encounter notes, upcoming appointments, etc.  Non-urgent messages can be sent to your provider as well.   ?To learn more about what you can do with MyChart, go to [email or web address].   ? ?Your next appointment:   ?6 month(s) ? ?The format for your next appointment:   ?In Person ? ?Provider:   ?[name], MD  ? ? ?Other Instructions ?None ? ?

## 2021-07-11 NOTE — Progress Notes (Signed)
?Cardiology Office Note:   ? ?Date:  07/11/2021  ? ?ID:  [name], DOB 07/27/1945, MRN [id number] ? ?PCP:  [name], MD  ?Cardiologist:  [name], MD   ? ?Referring MD: [name], *  ? ?Chief Complaint  ?Patient presents with  ? Follow-up  ?I am doing very well ? ?History of Present Illness:   ? ?Barbara Yu is a 76 y.o. female with past medical history significant for essential hypertension, diabetes, chronic renal failure, she does have fairly typical angina pectoris.  She did have 2 stress test done both were negative in spite of that she still experiencing exertional chest tightness.  Does have very rare episodes.  And she is very happy where she is she is already on long-acting nitrates as well as beta-blocker and calcium channel blocker.  Overall she is very happy and she said she is doing well. ? ?Past Medical History:  ?Diagnosis Date  ? Anemia   ? Anemia in stage 3 chronic kidney disease ([place]) 02/03/2018  ? Anemia in stage 4 chronic kidney disease ([place]) 02/03/2018  ? Angina pectoris ([place]) 01/11/2020  ? AR (allergic rhinitis) 10/02/2016  ? CKD (chronic kidney disease) stage 4, GFR 15-29 ml/min (HCC) 10/05/2016  ? Diabetes mellitus without complication ([place])   ? Dyslipidemia 07/14/2019  ? 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 Some spinal stenosis-MRI 2013 Surgery 06/11/12 Dr Prince Rome L3-S1 PSF, L34 and L5-S1 Bilateral decompression with stryker, s/p L4-5 PLIF stryker.  [place]   ? Gastro-esophageal reflux disease without esophagitis 06/03/2012  ? Gout 10/03/2016  ? HTN (hypertension) 06/03/2012  ? Hypercalcemia 03/08/2020  ? Hyperkalemia, diminished renal excretion 01/05/2019  ? Hypertension   ? Hypothyroidism 06/03/2012  ? Iron deficiency anemia 10/02/2016  ? Lumbar pseudoarthrosis 06/28/2016  ? Formatting of this note might be different from the original. Added automatically from request for surgery [phone]  ? Memory deficit 06/27/2018  ? MMSE 06/2017: 27/30. Pass is 28.   ?  Nonspecific abnormal electrocardiogram (ECG) (EKG) 07/14/2019  ? Normal cardiac stress test 06/09/2012  ? at [facility] cardiac [facility] test (surgery preop); Dr Norman Clay  ? Osteoarthritis 10/02/2016  ? Pain in right hip 01/03/2012  ? Formatting of this note might be different from the original. Overview:  Right greater than left. MRI done 2013 shows right hip has partial posterior labral tear. Also has component of greater trochanteric bursitis and had corticosteroid injection with good relief in early July, 2012  ? Peripheral neuropathy 10/02/2016  ? Precordial chest pain 07/14/2019  ? Primary osteoarthritis of both hips 01/03/2012  ? Right greater than left. MRI done 2013 shows right hip has partial posterior labral tear. Also has component of greater trochanteric bursitis and had corticosteroid injection with good relief in early July, 2012   ? Thrombocytopenia, unspecified ([place]) 02/12/2013  ? Formatting of this note might be different from the original. 10/1 IMO update  ? Type 2 diabetes mellitus with stage 4 chronic kidney disease ([place]) 06/03/2012  ? Uncontrolled type 2 diabetes with renal manifestation 10/16/2019  ? Vitamin D deficiency   ? ? ?Past Surgical History:  ?Procedure Laterality Date  ? BACK SURGERY    ? Fibroid removed    ? ? ?Current Medications: ?Current Meds  ?Medication Sig  ? acetaminophen (TYLENOL) 325 MG tablet Take 650 mg by mouth as needed for mild pain or moderate pain.  ? albuterol (PROAIR HFA) 108 (90 Base) MCG/ACT inhaler Inhale 2 puffs into the lungs every 6 (  six) hours as needed for wheezing or shortness of breath. USE 2 INHALATIONS EVERY 6 HOURS AS NEEDED  ? amLODipine (NORVASC) 5 MG tablet TAKE 1 TABLET DAILY (Patient taking differently: Take 5 mg by mouth daily.)  ? colchicine 0.6 MG tablet Take 1 tablet (0.6 mg total) by mouth daily as needed. (Patient taking differently: Take 0.6 mg by mouth daily as needed (Gout flare up).)  ? diazepam (VALIUM) 10 MG tablet Take 10 mg by mouth daily as  needed for muscle spasms.  ? glipiZIDE (GLUCOTROL) 5 MG tablet Take 1 tablet (5 mg total) by mouth 2 (two) times daily.  ? insulin isophane & regular human KwikPen (HUMULIN 70/30 KWIKPEN) (70-30) 100 UNIT/ML KwikPen INJECT 34 UNITS IN THE MORNING AND 32 UNITS IN THE EVENING (Patient taking differently: 32-34 Units 2 (two) times daily. INJECT 34 UNITS IN THE MORNING AND 32 UNITS IN THE EVENING)  ? isosorbide mononitrate (IMDUR) 120 MG 24 hr tablet TAKE 1 TABLET DAILY (Patient taking differently: Take 120 mg by mouth daily.)  ? levothyroxine (SYNTHROID) 75 MCG tablet Take 1 tablet (75 mcg total) by mouth daily.  ? metoprolol tartrate (LOPRESSOR) 25 MG tablet Take 1 tablet (25 mg total) by mouth 2 (two) times daily.  ? montelukast (SINGULAIR) 10 MG tablet Take 1 tablet (10 mg total) by mouth at bedtime.  ? NARCAN 4 MG/0.1ML LIQD nasal spray kit Place 1 spray into the nose once.  ? nitroGLYCERIN (NITROSTAT) 0.4 MG SL tablet DISSOLVE 1 TABLET UNDER THE TONGUE EVERY 5 MINUTES AS NEEDED (Patient taking differently: Place 0.4 mg under the tongue every 5 (five) minutes as needed for chest pain.)  ? Oxycodone HCl 10 MG TABS Take 10 mg by mouth daily.  ? polyethylene glycol (MIRALAX / GLYCOLAX) packet Take 17 g by mouth as needed for mild constipation or moderate constipation.  ? pravastatin (PRAVACHOL) 20 MG tablet TAKE 1 TABLET EVERY EVENING (Patient taking differently: Take 20 mg by mouth daily.)  ? quinapril (ACCUPRIL) 20 MG tablet Take 1 tablet (20 mg total) by mouth at bedtime.  ? SYNTHROID 50 MCG tablet TAKE 1 TABLET TWO TIMES A WEEK. ON SUNDAY AND WEDNESDAY. ALTERNATING WITH 75 MCG THE OTHER 5 DAYS (Patient taking differently: Take 50 mcg by mouth daily before breakfast.)  ? tolterodine (DETROL LA) 4 MG 24 hr capsule Take 1 capsule (4 mg total) by mouth daily.  ? Triamcinolone Acetonide (TRIAMCINOLONE 0.1 % CREAM : EUCERIN) CREA Apply 1 application topically 2 (two) times daily as needed for itching. 1 jar  ? VICTOZA  18 MG/3ML SOPN INJECT 1.2 MG UNDER THE SKIN DAILY (Patient taking differently: Inject 1.2 mg into the skin daily.)  ? XTAMPZA ER 18 MG C12A Take 1 capsule by mouth 2 (two) times daily.  ?  ? ?Allergies:   Amoxicillin, Atorvastatin, Hydrochlorothiazide, Nsaids, Tramadol, Allopurinol, Morphine, Omeprazole, Pepcid [famotidine], Trulicity [dulaglutide], Ciprofloxacin, Gabapentin, and Pantoprazole  ? ?Social History  ? ?Socioeconomic History  ? Marital status: Widowed  ?  Spouse name: Not on file  ? Number of children: 5  ? Years of education: 52  ? Highest education level: Associate degree: occupational, Hotel manager, or vocational program  ?Occupational History  ? Occupation: retired  ?Tobacco Use  ? Smoking status: Former  ?  Types: Cigarettes  ?  Quit date: 09/08/1979  ?  Years since quitting: 41.8  ? Smokeless tobacco: Never  ?Substance and Sexual Activity  ? Alcohol use: No  ? Drug use: No  ? Sexual  activity: Never  ?Other Topics Concern  ? Not on file  ?Social History Narrative  ? Lives alone. She has five children. She enjoys watching t.v.  ? ?Social Determinants of Health  ? ?Financial Resource Strain: Low Risk   ? Difficulty of Paying Living Expenses: Not hard at all  ?Food Insecurity: No Food Insecurity  ? Worried About Charity fundraiser in the Last Year: Never true  ? Ran Out of Food in the Last Year: Never true  ?Transportation Needs: No Transportation Needs  ? Lack of Transportation (Medical): No  ? Lack of Transportation (Non-Medical): No  ?Physical Activity: Inactive  ? Days of Exercise per Week: 0 days  ? Minutes of Exercise per Session: 0 min  ?Stress: No Stress Concern Present  ? Feeling of Stress : Only a little  ?Social Connections: Moderately Integrated  ? Frequency of Communication with Friends and Family: More than three times a week  ? Frequency of Social Gatherings with Friends and Family: Three times a week  ? Attends Religious Services: More than 4 times per year  ? Active Member of Clubs or  Organizations: Yes  ? Attends Archivist Meetings: More than 4 times per year  ? Marital Status: Widowed  ?  ? ?Family History: ?The patient's family history includes Diabetes in her brother, mothe

## 2021-07-12 LAB — LDL CHOLESTEROL, DIRECT: LDL Direct: 56 mg/dL (ref 0–99)

## 2021-07-12 IMAGING — MR MR LUMBAR SPINE W/O CM
4 of 5 series · 25 of 48 positions shown · non-contrast
Comparison: Chest CT 10/28/2017.

CLINICAL DATA: 74-year-old female with prior lumbar surgeries.
Progressive low back pain. Bilateral hip and leg pain right greater
than left.

EXAM:
MRI LUMBAR SPINE WITHOUT CONTRAST
TECHNIQUE: Multiplanar, multisequence MR imaging of the lumbar spine was
performed. No intravenous contrast was administered.

[Series 5: T2 · sagittal · 4.0mm · 0.81mm/px · 6 of 15 slices shown (1 of 2)]
[im 1/15]
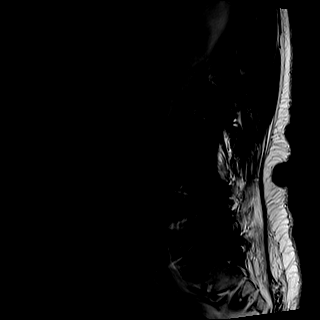
[im 3/15]
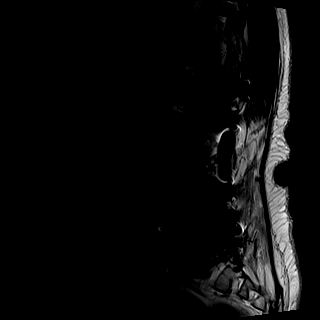
[im 6/15]
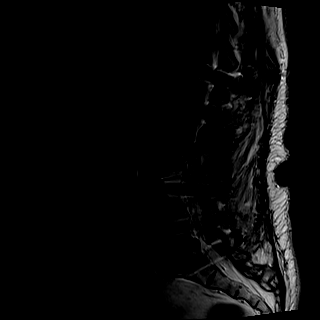
[im 9/15]
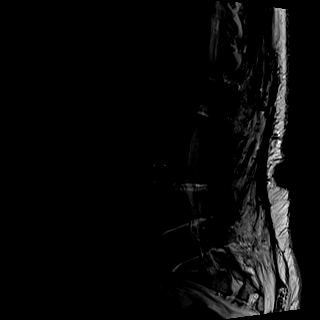
[im 12/15]
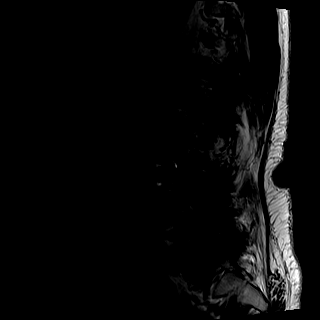
[im 15/15]
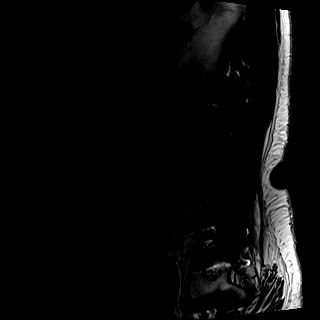

[Series 6: T1 · sagittal · 4.0mm · 0.41mm/px · 6 of 15 slices shown (1 of 2)]
[im 1/15]
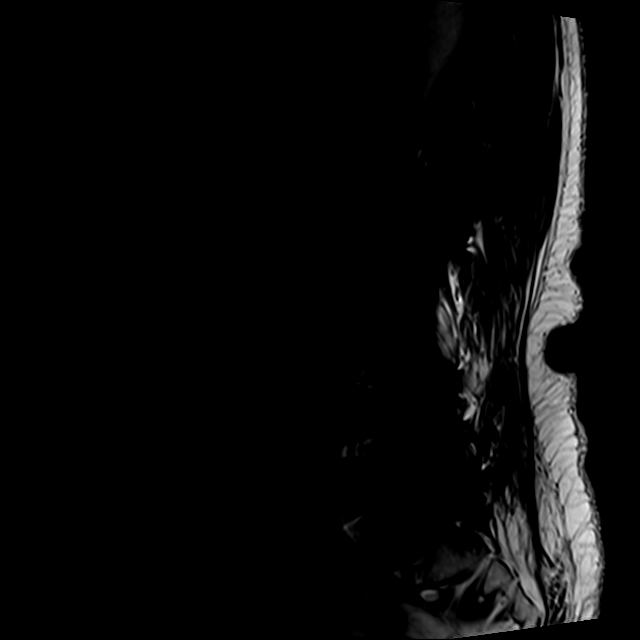
[im 3/15]
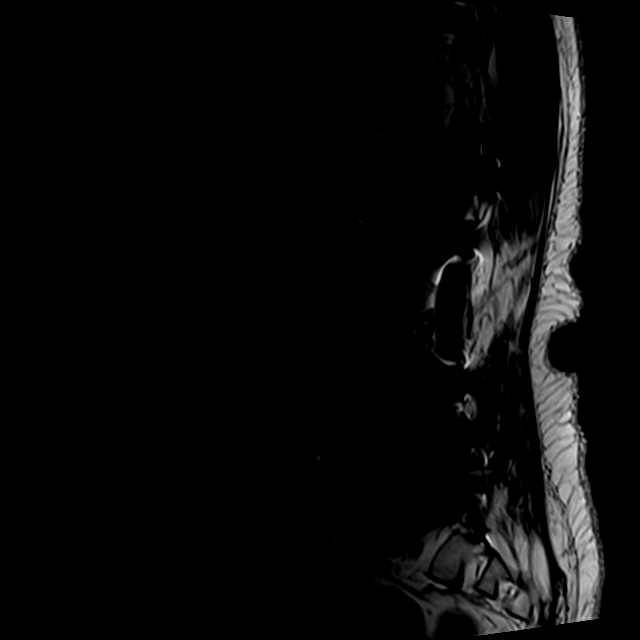
[im 6/15]
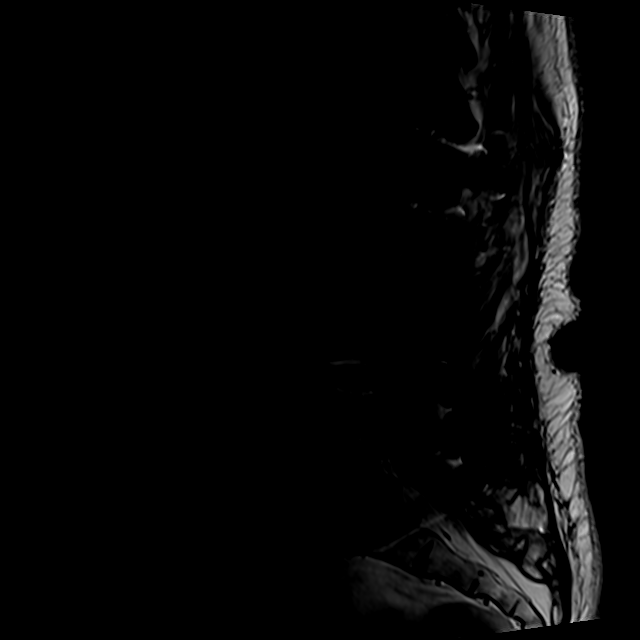
[im 9/15]
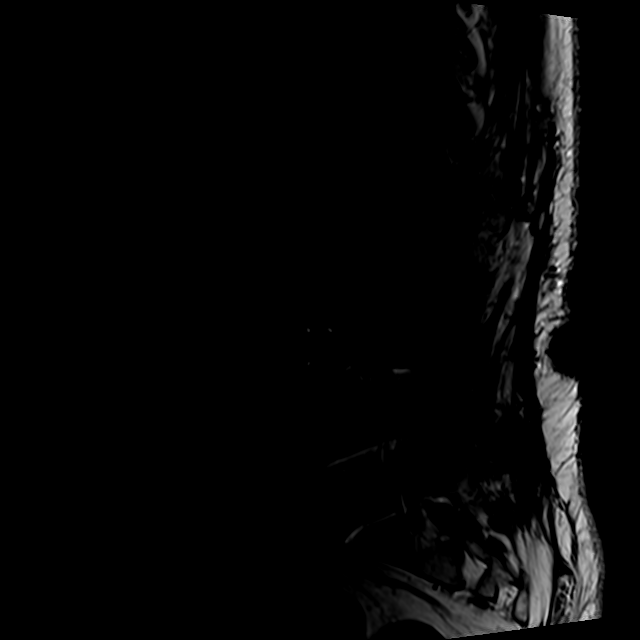
[im 12/15]
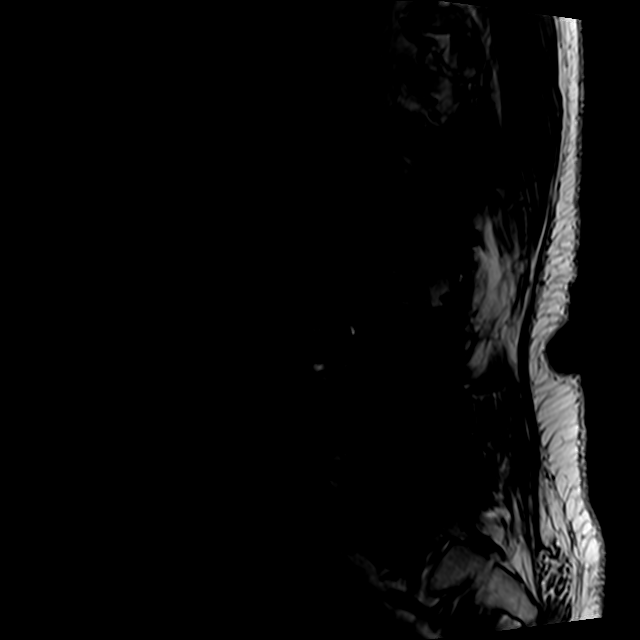
[im 15/15]
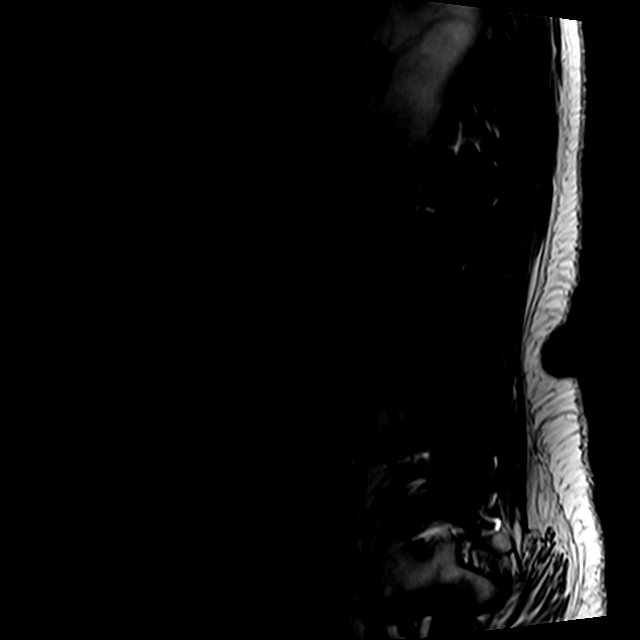

[Series 10: T2 · axial · 4.0mm · 0.78mm/px · z∈[-28,+191]mm · 9 of 39 slices shown (2 of 2)]
[im 1/39]
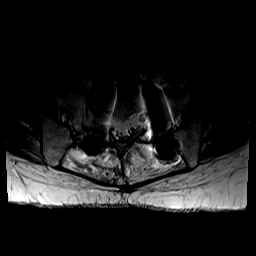
[im 6/39]
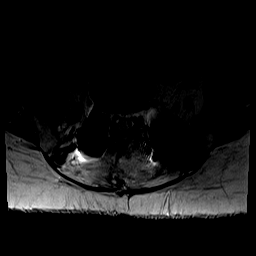
[im 11/39]
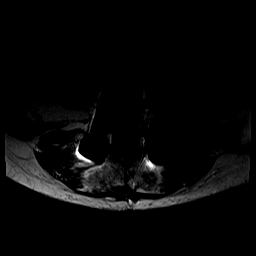
[im 17/39]
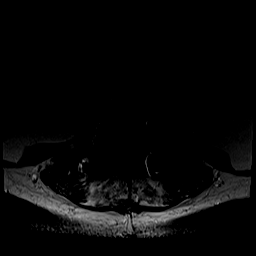
[im 20/39]
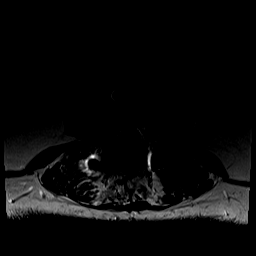
[im 22/39]
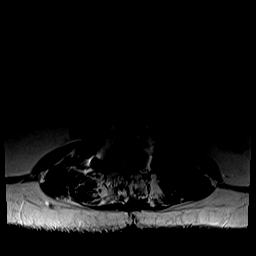
[im 28/39]
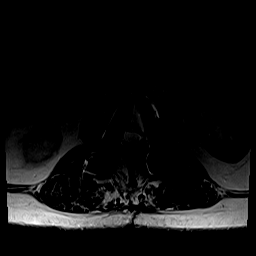
[im 33/39]
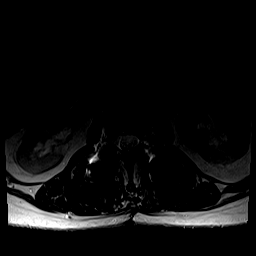
[im 39/39]
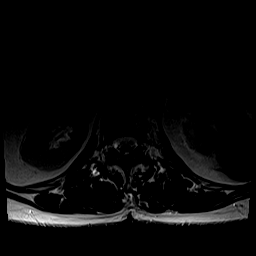

[Series 13: T1 · axial · 4.0mm · 0.39mm/px · z∈[-28,+162]mm · 4 of 39 slices shown (2 of 2)]
[im 1/39]
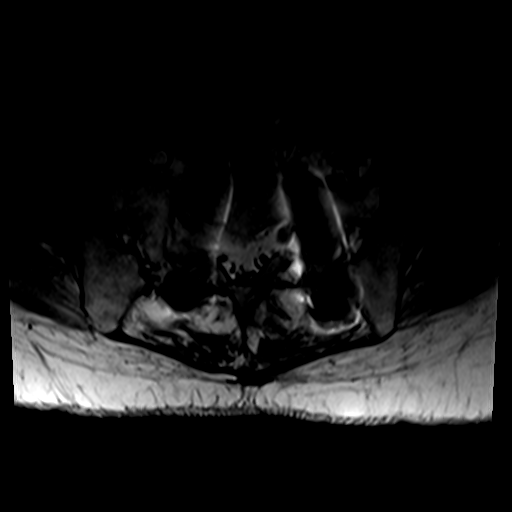
[im 6/39]
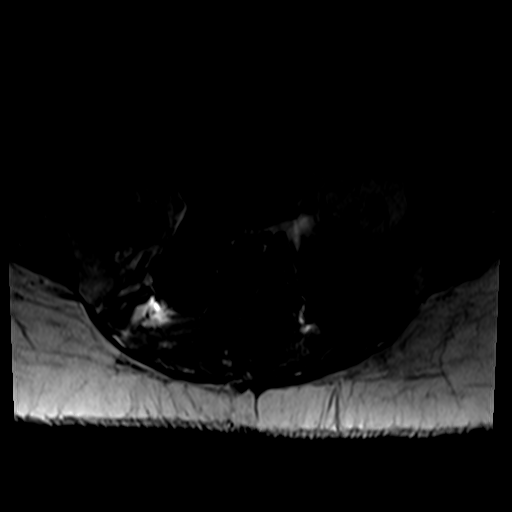
[im 20/39]
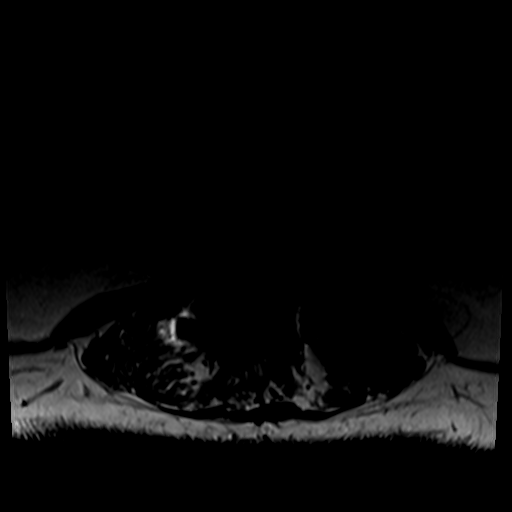
[im 33/39]
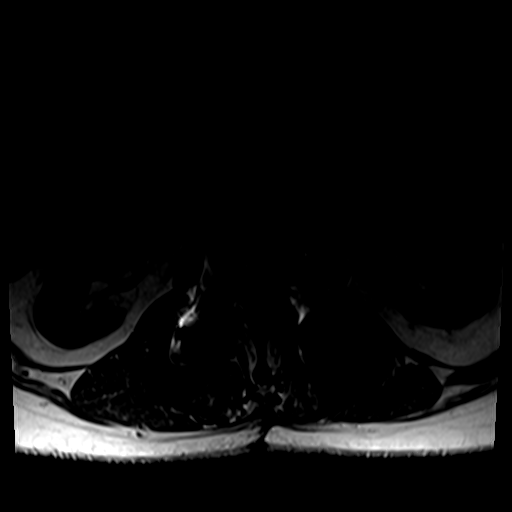

[25 of 48 positions shown; findings below may reference images not displayed]

FINDINGS: Segmentation: Thoracic and lumbar segmentation appears to be normal
in correlating with the 8045 chest study.

Alignment:  Relatively normal lumbar lordosis.

Vertebrae: Spinal fusion hardware susceptibility artifact from L1 to
the sacrum. Background bone marrow signal is within normal limits.
No convincing marrow edema or acute osseous abnormality. Intact
visible sacrum and SI joints.

Conus medullaris and cauda equina: Conus extends to the T12-L1
level. No lower spinal cord or conus signal abnormality.

Paraspinal and other soft tissues: Postoperative changes to the
posterior paraspinal soft tissues. No significant postoperative
fluid collection.

Negative visible abdominal viscera.

Disc levels:

T11-T12: Negative.

T12-L1: Disc space loss with mild circumferential disc bulge and
posterior element hypertrophy. But no spinal, lateral recess or
foraminal stenosis results.

L1-L2: Posterior fusion hardware. Minor disc bulging. Mild posterior
element hypertrophy. No convincing stenosis.

L2-L3: Posterior fusion hardware with mild endplate spurring and
mild to moderate facet hypertrophy but normal disc. Mild if any
osseous L2 foraminal stenosis, greater on the right.

L3-L4: Prior decompression and fusion with interbody implant and
suspected solid arthrodesis. No convincing stenosis.

L4-L5: Prior decompression and fusion with interbody implant and
suspected solid arthrodesis. No convincing stenosis.

L5-S1: Prior decompression and posterior fusion hardware.
Circumferential disc bulge with endplate spurring eccentric to the
right. Ventral epidural lipomatosis. No spinal or convincing lateral
recess stenosis. Mild right L5 foraminal stenosis related to
foraminal disc osteophyte complex.
IMPRESSION: 1. Prior fusion from L1 to S1 with a satisfactory appearance
overall. Suspected solid arthrodesis at least at L3-L4 and L4-L5.
2. No lumbar spinal stenosis. Mild adjacent segment disease at
T12-L1 without stenosis. Mild right neural foraminal stenosis
suspected at L5-S1 due to foraminal disc osteophyte complex. Query
right L5 radiculitis.

## 2021-08-24 LAB — HEMOGLOBIN A1C: Hemoglobin A1C: 7.7

## 2021-08-29 ENCOUNTER — Ambulatory Visit (INDEPENDENT_AMBULATORY_CARE_PROVIDER_SITE_OTHER): Payer: Medicare Other | Admitting: Family Medicine

## 2021-08-29 ENCOUNTER — Encounter: Payer: Self-pay | Admitting: Family Medicine

## 2021-08-29 VITALS — BP 120/62 | HR 85 | Wt 163.0 lb

## 2021-08-29 DIAGNOSIS — I209 Angina pectoris, unspecified: Secondary | ICD-10-CM | POA: Diagnosis not present

## 2021-08-29 DIAGNOSIS — M1A0790 Idiopathic chronic gout, unspecified ankle and foot, without tophus (tophi): Secondary | ICD-10-CM

## 2021-08-29 DIAGNOSIS — N760 Acute vaginitis: Secondary | ICD-10-CM | POA: Diagnosis not present

## 2021-08-29 DIAGNOSIS — I10 Essential (primary) hypertension: Secondary | ICD-10-CM | POA: Diagnosis not present

## 2021-08-29 DIAGNOSIS — E1122 Type 2 diabetes mellitus with diabetic chronic kidney disease: Secondary | ICD-10-CM

## 2021-08-29 DIAGNOSIS — R7401 Elevation of levels of liver transaminase levels: Secondary | ICD-10-CM

## 2021-08-29 DIAGNOSIS — Z794 Long term (current) use of insulin: Secondary | ICD-10-CM

## 2021-08-29 DIAGNOSIS — N184 Chronic kidney disease, stage 4 (severe): Secondary | ICD-10-CM | POA: Diagnosis not present

## 2021-08-29 DIAGNOSIS — N952 Postmenopausal atrophic vaginitis: Secondary | ICD-10-CM | POA: Insufficient documentation

## 2021-08-29 DIAGNOSIS — R142 Eructation: Secondary | ICD-10-CM

## 2021-08-29 MED ORDER — QUINAPRIL HCL 20 MG PO TABS: 20.0000 mg | ORAL_TABLET | Freq: Every day | ORAL | 3 refills | Status: DC

## 2021-08-29 MED ORDER — RANITIDINE HCL 75 MG PO TABS: 75.0000 mg | ORAL_TABLET | Freq: Two times a day (BID) | ORAL | 0 refills | Status: DC

## 2021-08-29 MED ORDER — ESTRADIOL 0.1 MG/GM VA CREA: 1.0000 | TOPICAL_CREAM | Freq: Every day | VAGINAL | 3 refills | Status: DC

## 2021-08-29 MED ORDER — TRIAMCINOLONE 0.1 % CREAM:EUCERIN CREAM 1:1: 1.0000 "application " | TOPICAL_CREAM | Freq: Two times a day (BID) | CUTANEOUS | 99 refills | Status: DC | PRN

## 2021-08-29 NOTE — Assessment & Plan Note (Signed)
Restart the estrogen vaginal cream.   ?

## 2021-08-29 NOTE — Progress Notes (Signed)
? ?Established Patient Office Visit ? ?Subjective   ?Patient ID: Barbara Yu, female    DOB: Aug 24, 1945  Age: 76 y.o. MRN: [id number] ? ?Chief Complaint  ?Patient presents with  ? Hypertension  ? menopausal dryness  ?  Pt reports that she has been experiencing some vaginal dryness and has been using some OTC meds to help with this would like to discuss other treatment options.   ? ? ?HPI ? ? ?Diabetes - no hypoglycemic events. No wounds or sores that are not healing well. No increased thirst or urination. Checking glucose at home. Taking medications as prescribed without any side effects. She feels gassy on the Ozempic.  She is down 5 lbs.  Now following with Dr. Awilda Metro.  She says has been having some bad smelling burps and has had some loose stools.  She has been taking an old heartburn medication that she had at home and it has helped she is also been using a little bit of Pepto-Bismol here and there and has noticed some black-colored stools.  She had side effects with omeprazole, pantoprazole and famotidine. ? ?Hypertension- Pt denies chest pain, SOB, dizziness, or heart palpitations.  Taking meds as directed w/o problems.  Denies medication side effects.  She also reports that she has been unable to get refills on her quinapril.  The pharmacy says they have tried to contact us.  So she is currently out of it. ? ?Gout - she was previously followed by Dr. Jerene Pitch.  Been a couple years since she was last evaluated she was previously receiving infusions and in fact her uric acid level was undetectable at that time.  But more recently she has been having some increased pain in her great toe. ? ?She also reports some vaginal irritation for a little over 1 month.  She says she feels like it is probably dryness.  It started out with itching and irritation.  It just feels uncomfortable.  She is to have a prescription for estrogen cream but could not find it. ? ?CKD 4-follows with Dr. Melton Alar at [facility]. ? ? ? ?ROS ? ?  ?Objective:  ?  ? ?BP 120/62   Pulse 85   Wt 163 lb (73.9 kg)   SpO2 100%   BMI 27.98 kg/m?  ? ? ?Physical Exam ?Vitals and nursing note reviewed.  ?Constitutional:   ?   Appearance: She is well-developed.  ?HENT:  ?   Head: Normocephalic and atraumatic.  ?Cardiovascular:  ?   Rate and Rhythm: Normal rate and regular rhythm.  ?   Heart sounds: Normal heart sounds.  ?Pulmonary:  ?   Effort: Pulmonary effort is normal.  ?   Breath sounds: Normal breath sounds.  ?Skin: ?   General: Skin is warm and dry.  ?Neurological:  ?   Mental Status: She is alert and oriented to person, place, and time.  ?Psychiatric:     ?   Behavior: Behavior normal.  ? ? ? ?Results for orders placed or performed in visit on 08/29/21  ?Hemoglobin A1c  ?Result Value Ref Range  ? Hemoglobin A1C 7.7   ? ? ? ? ?The 10-year ASCVD risk score (Arnett DK, et al., 2019) is: 34.2% ? ?  ?Assessment & Plan:  ? ?Problem List Items Addressed This Visit   ? ?  ? Cardiovascular and Mediastinum  ? HTN (hypertension) - Primary  ?  Well controlled. Continue current regimen. Will refill Quinapril. Monitor BP when restart medication. Follow up in  6 mo  ? ?  ?  ? Relevant Medications  ? quinapril (ACCUPRIL) 20 MG tablet  ?  ? Endocrine  ? Type 2 diabetes mellitus with stage 4 chronic kidney disease ([place])  ? Relevant Medications  ? insulin aspart (NOVOLOG) 100 UNIT/ML injection  ? insulin glargine (LANTUS SOLOSTAR) 100 UNIT/ML Solostar Pen  ? Semaglutide,0.25 or 0.'5MG'$ /DOS, (OZEMPIC, 0.25 OR 0.5 MG/DOSE,) 2 MG/1.5ML SOPN  ? quinapril (ACCUPRIL) 20 MG tablet  ?  ? Genitourinary  ? Vaginal atrophy  ?  Restart the estrogen vaginal cream.   ? ?  ?  ? Relevant Medications  ? estradiol (ESTRACE) 0.1 MG/GM vaginal cream  ? CKD (chronic kidney disease) stage 4, GFR 15-29 ml/min (HCC)  ?  ? Other  ? Gout  ?  Starting to have pain in her great toe. Will check uric acid level again.  No longer on prophylaxis.  ? ?  ?  ? Relevant Orders  ? Uric acid   ? ?Other Visit Diagnoses   ? ? Acute vaginitis      ? Relevant Medications  ? estradiol (ESTRACE) 0.1 MG/GM vaginal cream  ? Other Relevant Orders  ? WET PREP FOR [place], YEAST, CLUE  ? Elevated AST (SGOT)      ? Relevant Orders  ? Hepatic function panel  ? Belching      ? ?  ? ?Belching-we did discuss that this could be secondary to the Ozempic and I also strongly suspect that is the cause.  We can certainly reinstate her H2 blocker consistently and see if that helps as well.  She is been using a little Pepto-Bismol which I suspect is probably turning the stools little dark.  Can also have her do some stool cards as well just to make sure that there is no blood in the stool. ? ?Acute vaginitis-wet prep sent to pharmacy suspect her symptoms are most likely from atrophic vaginitis.  We will go ahead and restart her estrogen cream. ? ?Return in about 6 months (around 03/01/2022).  ?I spent 40 minutes on the day of the encounter to include pre-visit record review, face-to-face time with the patient and post visit ordering of test. ? ? ?Beatrice Lecher, MD ? ?

## 2021-08-29 NOTE — Assessment & Plan Note (Addendum)
Starting to have pain in her great toe. Will check uric acid level again.  No longer on prophylaxis.  ?

## 2021-08-29 NOTE — Assessment & Plan Note (Addendum)
Well controlled. Continue current regimen. Will refill Quinapril. Monitor BP when restart medication. Follow up in  6 mo  ?

## 2021-08-30 ENCOUNTER — Other Ambulatory Visit: Payer: Self-pay | Admitting: *Deleted

## 2021-08-30 LAB — WET PREP FOR TRICH, YEAST, CLUE
MICRO NUMBER:: 13375748
Specimen Quality: ADEQUATE

## 2021-08-30 LAB — HEPATIC FUNCTION PANEL
AG Ratio: 1.7 (calc) (ref 1.0–2.5)
ALT: 21 U/L (ref 6–29)
AST: 28 U/L (ref 10–35)
Albumin: 4.5 g/dL (ref 3.6–5.1)
Alkaline phosphatase (APISO): 59 U/L (ref 37–153)
Bilirubin, Direct: 0.1 mg/dL (ref 0.0–0.2)
Globulin: 2.6 g/dL (calc) (ref 1.9–3.7)
Indirect Bilirubin: 0.3 mg/dL (calc) (ref 0.2–1.2)
Total Bilirubin: 0.4 mg/dL (ref 0.2–1.2)
Total Protein: 7.1 g/dL (ref 6.1–8.1)

## 2021-08-30 LAB — URIC ACID: Uric Acid, Serum: 9.1 mg/dL — ABNORMAL HIGH (ref 2.5–7.0)

## 2021-08-30 MED ORDER — ALLOPURINOL 100 MG PO TABS: 100.0000 mg | ORAL_TABLET | Freq: Every day | ORAL | 4 refills | Status: DC

## 2021-08-30 MED ORDER — FLUCONAZOLE 150 MG PO TABS: 150.0000 mg | ORAL_TABLET | Freq: Once | ORAL | 1 refills | Status: AC

## 2021-08-30 NOTE — Progress Notes (Signed)
Hi Ms. Hojnacki, your uric acid is high again.  Optimally we want it under 6.  That reduces the chances of you having a flare.  But your level is back up to 9.  So I would like to consider putting you on either allopurinol or Uloric.  We would need you to take the colchicine with 1 of these drugs for the first 6 months to reduce your chances of a flare.  And then you would just take the allopurinol or Uloric daily and only use the colchicine if you are having a flare.  The vaginal swab was positive for a yeast infection some sending over a pill called Diflucan which should take care of that and should help.  Please start using the estrogen cream again.  Your liver function is actually back down to normal which is fantastic!

## 2021-08-30 NOTE — Addendum Note (Signed)
Addended by: Beatrice Lecher D on: 08/30/2021 01:15 PM ? ? Modules accepted: Orders ? ?

## 2021-09-07 ENCOUNTER — Other Ambulatory Visit: Payer: Self-pay | Admitting: *Deleted

## 2021-09-07 ENCOUNTER — Other Ambulatory Visit (INDEPENDENT_AMBULATORY_CARE_PROVIDER_SITE_OTHER): Payer: Medicare Other | Admitting: Family Medicine

## 2021-09-07 DIAGNOSIS — R195 Other fecal abnormalities: Secondary | ICD-10-CM

## 2021-09-07 DIAGNOSIS — Z780 Asymptomatic menopausal state: Secondary | ICD-10-CM

## 2021-09-07 DIAGNOSIS — Z0000 Encounter for general adult medical examination without abnormal findings: Secondary | ICD-10-CM

## 2021-09-07 LAB — POC HEMOCCULT BLD/STL (HOME/3-CARD/SCREEN)
Card #2 Fecal Occult Blod, POC: NEGATIVE
Card #3 Fecal Occult Blood, POC: NEGATIVE
Fecal Occult Blood, POC: NEGATIVE

## 2021-09-08 NOTE — Progress Notes (Signed)
Your lab work is within acceptable range and there are no concerning findings.   ?

## 2021-09-23 ENCOUNTER — Other Ambulatory Visit: Payer: Self-pay | Admitting: Family Medicine

## 2021-09-25 ENCOUNTER — Telehealth: Payer: Self-pay | Admitting: *Deleted

## 2021-09-26 ENCOUNTER — Encounter: Payer: Self-pay | Admitting: Family Medicine

## 2021-09-26 MED ORDER — LANSOPRAZOLE 15 MG PO CPDR: 15.0000 mg | DELAYED_RELEASE_CAPSULE | Freq: Every day | ORAL | 1 refills | Status: DC

## 2021-09-26 MED ORDER — ENALAPRIL MALEATE 10 MG PO TABS
10.0000 mg | ORAL_TABLET | Freq: Every day | ORAL | 1 refills | Status: DC
Start: 2021-09-26 — End: 2022-04-09

## 2021-09-26 NOTE — Telephone Encounter (Signed)
Pt's information faxed. Confirmation received.

## 2021-09-28 ENCOUNTER — Telehealth: Payer: Self-pay | Admitting: *Deleted

## 2021-09-28 MED ORDER — RABEPRAZOLE SODIUM 20 MG PO TBEC: 20.0000 mg | DELAYED_RELEASE_TABLET | Freq: Every day | ORAL | 4 refills | Status: DC

## 2021-09-28 NOTE — Telephone Encounter (Signed)
Received a fax from pt's pharmacy regarding Lansoprazole15 mg. This is a non-preferred medication. This was switched to Rabeprazle 20 mg #90

## 2021-10-20 ENCOUNTER — Other Ambulatory Visit: Payer: Self-pay | Admitting: Cardiology

## 2021-11-08 ENCOUNTER — Other Ambulatory Visit: Payer: Self-pay | Admitting: Cardiology

## 2021-12-13 ENCOUNTER — Encounter: Payer: Self-pay | Admitting: General Practice

## 2021-12-20 LAB — TSH: TSH: 3.39 (ref 0.41–5.90)

## 2021-12-20 LAB — VITAMIN D 25 HYDROXY (VIT D DEFICIENCY, FRACTURES): Vit D, 25-Hydroxy: 41.9

## 2021-12-20 LAB — HEMOGLOBIN A1C: Hemoglobin A1C: 8

## 2021-12-20 LAB — VITAMIN B12: Vitamin B-12: 801

## 2021-12-27 ENCOUNTER — Telehealth: Payer: Self-pay | Admitting: Family Medicine

## 2021-12-27 NOTE — Telephone Encounter (Signed)
Patient's son dropped off FMLA paperwork to be filled out by PCP. Patient was notified of 3-5 day turn around and a possible fee, where patient requested final paperwork to be faxed to provided number. Paperwork was place in provider's box. Barbara Yu

## 2021-12-29 ENCOUNTER — Encounter: Payer: Self-pay | Admitting: Cardiology

## 2021-12-29 ENCOUNTER — Ambulatory Visit: Payer: Medicare Other | Attending: Cardiology | Admitting: Cardiology

## 2021-12-29 VITALS — BP 120/62 | HR 73 | Ht 64.0 in | Wt 163.0 lb

## 2021-12-29 DIAGNOSIS — R0609 Other forms of dyspnea: Secondary | ICD-10-CM | POA: Diagnosis present

## 2021-12-29 DIAGNOSIS — I10 Essential (primary) hypertension: Secondary | ICD-10-CM

## 2021-12-29 DIAGNOSIS — E1122 Type 2 diabetes mellitus with diabetic chronic kidney disease: Secondary | ICD-10-CM

## 2021-12-29 DIAGNOSIS — Z794 Long term (current) use of insulin: Secondary | ICD-10-CM

## 2021-12-29 DIAGNOSIS — I209 Angina pectoris, unspecified: Secondary | ICD-10-CM

## 2021-12-29 DIAGNOSIS — N184 Chronic kidney disease, stage 4 (severe): Secondary | ICD-10-CM

## 2021-12-29 MED ORDER — NITROGLYCERIN 0.4 MG SL SUBL: 0.4000 mg | SUBLINGUAL_TABLET | SUBLINGUAL | 11 refills | Status: DC | PRN

## 2021-12-29 MED ORDER — METOPROLOL SUCCINATE ER 50 MG PO TB24: 75.0000 mg | ORAL_TABLET | Freq: Every day | ORAL | 3 refills | Status: DC

## 2021-12-29 NOTE — Addendum Note (Signed)
Addended by: Jacobo Forest D on: 12/29/2021 02:52 PM   Modules accepted: Orders

## 2021-12-29 NOTE — Patient Instructions (Addendum)
Medication Instructions:  Your physician has recommended you make the following change in your medication:   STOP: Metoprolol Tartrate  START: Metoprolol Succinate '75mg'$  daily by mouth    Lab Work: None Ordered If you have labs (blood work) drawn today and your tests are completely normal, you will receive your results only by: [place] (if you have MyChart) OR A paper copy in the mail If you have any lab test that is abnormal or we need to change your treatment, we will call you to review the results.   Testing/Procedures: Your physician has requested that you have an echocardiogram. Echocardiography is a painless test that uses sound waves to create images of your heart. It provides your doctor with information about the size and shape of your heart and how well your heart's chambers and valves are working. This procedure takes approximately one hour. There are no restrictions for this procedure.    Follow-Up: At [facility], you and your health needs are our priority.  As part of our continuing mission to provide you with exceptional heart care, we have created designated Provider Care Teams.  These Care Teams include your primary Cardiologist (physician) and Advanced Practice Providers (APPs -  Physician Assistants and Nurse Practitioners) who all work together to provide you with the care you need, when you need it.  We recommend signing up for the patient portal called "MyChart".  Sign up information is provided on this After Visit Summary.  MyChart is used to connect with patients for Virtual Visits (Telemedicine).  Patients are able to view lab/test results, encounter notes, upcoming appointments, etc.  Non-urgent messages can be sent to your provider as well.   To learn more about what you can do with MyChart, go to [email or web address].    Your next appointment:   6 month(s)  The format for your next appointment:   In Person  Provider:   [name], MD     Other Instructions NA

## 2021-12-29 NOTE — Progress Notes (Signed)
Cardiology Office Note:    Date:  12/29/2021   ID:  Barbara Yu, DOB 08-Dec-1945, MRN [id number]  PCP:  [name], MD  Cardiologist:  [name], MD    Referring MD: [name], *   Chief Complaint  Patient presents with   Chest Pain    History of Present Illness:    Barbara Yu is a 76 y.o. female with past medical history significant for essential hypertension, diabetes, chronic kidney failure with a GFR between 15 and 30, she presented to me initially with typical exertional chest pain.  Because of her kidney dysfunction we decided to do stress test she did have 2 stress test done already and both were negative.  She comes today to my office for follow-up surprising her EKG today shows symmetrical T inversion in anterior precordium that is usually indication of LAD disease, however, she tells me that she is doing well she has a rare episode of chest pain typically happen when she does some heavy work she describes for example situation when she has to carry her shopping home from car and that will give her pain she need to slow down and stop and pain goes away.  She does have nitroglycerin she used maybe 1 tablets a week.  Otherwise she says she is doing fine, she did not notice any decrease in her ability to tolerate exercise.  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  [facility]    Gastro-esophageal reflux disease without  esophagitis 06/03/2012   Gout 10/03/2016   HTN (hypertension) 06/03/2012   Hypercalcemia 03/08/2020   Hyperkalemia, diminished renal excretion 01/05/2019   Hypertension    Hypothyroidism 06/03/2012   Iron deficiency anemia 10/02/2016   Lumbar pseudoarthrosis 06/28/2016   Formatting of this note might be different from the original. Added automatically from request for surgery [id number]   Memory deficit 06/27/2018   MMSE 06/2017: 27/30. Pass is 28.    Nonspecific abnormal electrocardiogram (ECG) (EKG) 07/14/2019   Normal cardiac stress test 06/09/2012   at [facility] normal cardiac stress test (surgery preop); Dr Norman Clay   Osteoarthritis 10/02/2016   Pain in right hip 01/03/2012   Formatting of this note might be different from the original. Overview:  Right greater than left. MRI done 2013 shows right hip has partial posterior labral tear. Also has component of greater trochanteric bursitis and had corticosteroid injection with good relief in early July, 2012   Peripheral neuropathy 10/02/2016   Precordial chest pain 07/14/2019   Primary osteoarthritis of both hips 01/03/2012   Right greater than left. MRI done 2013 shows right hip has partial posterior labral tear. Also has component of greater trochanteric bursitis and had corticosteroid injection with good relief in early July, 2012    Thrombocytopenia, unspecified (Barbara Yu) 02/12/2013   Formatting of this note might be different from the original. 10/1 IMO update   Type 2 diabetes mellitus with stage 4 chronic kidney disease (Barbara Yu) 06/03/2012  Uncontrolled type 2 diabetes with renal manifestation 10/16/2019   Vitamin D deficiency     Past Surgical History:  Procedure Laterality Date   BACK SURGERY     Fibroid removed      Current Medications: Current Meds  Medication Sig   acetaminophen (TYLENOL) 325 MG tablet Take 650 mg by mouth as needed for mild pain or moderate pain.   albuterol (PROAIR HFA) 108 (90 Base) MCG/ACT inhaler Inhale 2 puffs into the lungs  every 6 (six) hours as needed for wheezing or shortness of breath. USE 2 INHALATIONS EVERY 6 HOURS AS NEEDED   allopurinol (ZYLOPRIM) 100 MG tablet Take 1 tablet (100 mg total) by mouth daily.   amLODipine (NORVASC) 5 MG tablet TAKE 1 TABLET DAILY (Patient taking differently: Take 5 mg by mouth daily.)   clonazePAM (KLONOPIN) 1 MG tablet Take 1 mg by mouth at bedtime as needed for anxiety.   colchicine 0.6 MG tablet Take 1 tablet (0.6 mg total) by mouth daily as needed. (Patient taking differently: Take 0.6 mg by mouth daily as needed (Gout flare up).)   diclofenac Sodium (VOLTAREN) 1 % GEL Apply 4 g topically 4 (four) times daily.   enalapril (VASOTEC) 10 MG tablet Take 1 tablet (10 mg total) by mouth daily.   ergocalciferol (VITAMIN D2) 1.25 MG (50000 UT) capsule Take 50,000 Units by mouth once a week.   estradiol (ESTRACE) 0.1 MG/GM vaginal cream Place 1 Applicatorful vaginally at bedtime. X 2 weeks and then twice a week.   insulin aspart (NOVOLOG) 100 UNIT/ML injection Inject 8-20 Units into the skin 2 (two) times daily. 20 in the morning and 8 in the evenings   insulin glargine (LANTUS SOLOSTAR) 100 UNIT/ML Solostar Pen Inject 30 Units into the skin daily.   isosorbide mononitrate (IMDUR) 120 MG 24 hr tablet TAKE 1 TABLET DAILY (Patient taking differently: Take 120 mg by mouth daily.)   levothyroxine (SYNTHROID) 75 MCG tablet Take 1 tablet (75 mcg total) by mouth daily.   metoprolol tartrate (LOPRESSOR) 25 MG tablet Take 1 tablet (25 mg total) by mouth 2 (two) times daily.   montelukast (SINGULAIR) 10 MG tablet Take 1 tablet (10 mg total) by mouth at bedtime.   NARCAN 4 MG/0.1ML LIQD nasal spray kit Place 1 spray into the nose once.   nitroGLYCERIN (NITROSTAT) 0.4 MG SL tablet Place 1 tablet (0.4 mg total) under the tongue every 5 (five) minutes as needed for chest pain.   Oxycodone HCl 10 MG TABS Take 10 mg by mouth daily.   polyethylene glycol (MIRALAX / GLYCOLAX) packet Take 17 g by mouth  as needed for mild constipation or moderate constipation.   pravastatin (PRAVACHOL) 20 MG tablet TAKE 1 TABLET EVERY EVENING (Patient taking differently: Take by mouth daily.)   RABEprazole (ACIPHEX) 20 MG tablet Take 1 tablet (20 mg total) by mouth daily.   SYNTHROID 50 MCG tablet TAKE 1 TABLET TWO TIMES A WEEK. ON SUNDAY AND WEDNESDAY. ALTERNATING WITH 75 MCG THE OTHER 5 DAYS (Patient taking differently: Take 50 mcg by mouth daily before breakfast.)   tolterodine (DETROL LA) 4 MG 24 hr capsule Take 1 capsule (4 mg total) by mouth daily.   Triamcinolone Acetonide (TRIAMCINOLONE 0.1 % CREAM : EUCERIN) CREA Apply 1 application. topically 2 (two) times daily as needed for itching. 1 jar   XTAMPZA ER 18 MG C12A Take 1 capsule by mouth 2 (two) times daily.   [DISCONTINUED] Semaglutide,0.25 or 0.5MG/DOS, (OZEMPIC, 0.25 OR 0.5 MG/DOSE,) 2  MG/1.5ML SOPN Inject 0.5 mg into the skin once a week.     Allergies:   Amoxicillin, Atorvastatin, Hydrochlorothiazide, Nsaids, Tramadol, Allopurinol, Morphine, Omeprazole, Pepcid [famotidine], Trulicity [dulaglutide], Ciprofloxacin, Gabapentin, and Pantoprazole   Social History   Socioeconomic History   Marital status: Widowed    Spouse name: Not on file   Number of children: 5   Years of education: 14   Highest education level: Associate degree: occupational, Hotel manager, or vocational program  Occupational History   Occupation: retired  Tobacco Use   Smoking status: Former    Types: Cigarettes    Quit date: 09/08/1979    Years since quitting: 42.3   Smokeless tobacco: Never  Substance and Sexual Activity   Alcohol use: No   Drug use: No   Sexual activity: Never  Other Topics Concern   Not on file  Social History Narrative   Lives alone. She has five children. She enjoys watching t.v.   Social Determinants of Health   Financial Resource Strain: Low Risk  (10/18/2020)   Overall Financial Resource Strain (CARDIA)    Difficulty of Paying Living  Expenses: Not hard at all  Food Insecurity: No Food Insecurity (10/18/2020)   Hunger Vital Sign    Worried About Running Out of Food in the Last Year: Never true    Ran Out of Food in the Last Year: Never true  Transportation Needs: No Transportation Needs (10/18/2020)   PRAPARE - Hydrologist (Medical): No    Lack of Transportation (Non-Medical): No  Physical Activity: Inactive (10/18/2020)   Exercise Vital Sign    Days of Exercise per Week: 0 days    Minutes of Exercise per Session: 0 min  Stress: No Stress Concern Present (10/18/2020)   [place]    Feeling of Stress : Only a little  Social Connections: Moderately Integrated (10/18/2020)   Social Connection and Isolation Panel [NHANES]    Frequency of Communication with Friends and Family: More than three times a week    Frequency of Social Gatherings with Friends and Family: Three times a week    Attends Religious Services: More than 4 times per year    Active Member of Clubs or Organizations: Yes    Attends Archivist Meetings: More than 4 times per year    Marital Status: Widowed     Family History: The patient's family history includes Diabetes in her brother, mother, and son. ROS:   Please see the history of present illness.    All 14 point review of systems negative except as described per history of present illness  EKGs/Labs/Other Studies Reviewed:      Recent Labs: 08/29/2021: ALT 21  Recent Lipid Panel    Component Value Date/Time   CHOL 135 09/25/2019 1507   TRIG 262 (H) 09/25/2019 1507   HDL 50 09/25/2019 1507   CHOLHDL 2.7 09/25/2019 1507   CHOLHDL 3.3 02/23/2019 1356   LDLCALC 45 09/25/2019 1507   LDLCALC 72 02/23/2019 1356   LDLDIRECT 56 07/11/2021 1513    Physical Exam:    VS:  BP 120/62 (BP Location: Left Arm, Patient Position: Sitting)   Pulse 73   Ht _0  (1.626 m)   Wt 163 lb (73.9 kg)    SpO2 94%   BMI 27.98 kg/m     Wt Readings from Last 3 Encounters:  12/29/21 163 lb (73.9 kg)  08/29/21 163 lb (73.9 kg)  07/11/21  168 lb (76.2 kg)     GEN:  Well nourished, well developed in no acute distress HEENT: Normal NECK: No JVD; No carotid bruits LYMPHATICS: No lymphadenopathy CARDIAC: RRR, no murmurs, no rubs, no gallops RESPIRATORY:  Clear to auscultation without rales, wheezing or rhonchi  ABDOMEN: Soft, non-tender, non-distended MUSCULOSKELETAL:  No edema; No deformity  SKIN: Warm and dry LOWER EXTREMITIES: no swelling NEUROLOGIC:  Alert and oriented x 3 PSYCHIATRIC:  Normal affect   ASSESSMENT:    1. Angina pectoris ([place])   2. Primary hypertension   3. Type 2 diabetes mellitus with stage 4 chronic kidney disease, with long-term current use of insulin (Barbara Yu)   4. CKD (chronic kidney disease) stage 4, GFR 15-29 ml/min (HCC)    PLAN:    In order of problems listed above:  Angina pectoris.   stable pattern but the more about her EKG changing today.  I will increase dose of beta-blocker she takes 25 mg of metoprolol tartrate twice daily I will increase to 75 mg metoprolol succinate.  I gave her a new prescription for nitroglycerin, I will schedule him to have echocardiogram to check left ventricle ejection fraction.  I warned her that if she develops more symptoms lasting longer she need to let me know. 2.  Essential hypertension, blood pressure seems to well controlled right now we will continue present management. 3.  Dyslipidemia I did review K PN which show me her LDL 56 which is from March excellent cholesterol profile continue present management. Chronic kidney failure, that being followed by internal medicine team as well as nephrologist. 5.  Diabetes mellitus with last hemoglobin A1c 7.7, I would like to see this less than 7.  Of course like always the issue with kidney dysfunction.  We had multiple discussions before about potentially doing cardiac  catheterization, she does not want to do it because she is worried about kidney dysfunction.  Medication Adjustments/Labs and Tests Ordered: Current medicines are reviewed at length with the patient today.  Concerns regarding medicines are outlined above.  No orders of the defined types were placed in this encounter.  Medication changes: No orders of the defined types were placed in this encounter.   Signed, Park Liter, MD, [facility] 12/29/2021 2:34 PM    [place]

## 2021-12-29 NOTE — Telephone Encounter (Signed)
Forms completed and placed in [place] B basket

## 2022-01-02 NOTE — Telephone Encounter (Signed)
Faxed form to the number provided.

## 2022-01-03 LAB — COMPREHENSIVE METABOLIC PANEL
Albumin: 4.9 (ref 3.5–5.0)
Calcium: 9.7 (ref 8.7–10.7)
eGFR: 31

## 2022-01-03 LAB — BASIC METABOLIC PANEL
BUN: 22 — AB (ref 4–21)
CO2: 19 (ref 13–22)
Chloride: 106 (ref 99–108)
Creatinine: 1.7 — AB (ref 0.5–1.1)
Glucose: 208
Potassium: 5 mEq/L (ref 3.5–5.1)
Sodium: 141 (ref 137–147)

## 2022-01-19 ENCOUNTER — Ambulatory Visit (INDEPENDENT_AMBULATORY_CARE_PROVIDER_SITE_OTHER): Payer: Medicare Other | Admitting: Physician Assistant

## 2022-01-19 DIAGNOSIS — Z0000 Encounter for general adult medical examination without abnormal findings: Secondary | ICD-10-CM

## 2022-01-19 DIAGNOSIS — Z780 Asymptomatic menopausal state: Secondary | ICD-10-CM

## 2022-01-19 NOTE — Patient Instructions (Addendum)
[place] Maintenance Summary and Written Plan of Care  [name] ,  Thank you for allowing me to perform your Medicare Annual Wellness Visit and for your ongoing commitment to your health.   Health Maintenance & Immunization History Health Maintenance  Topic Date Due   OPHTHALMOLOGY EXAM  01/19/2022 (Originally 06/15/2021)   FOOT EXAM  01/20/2022 (Originally 05/26/2021)   Diabetic kidney evaluation - GFR measurement  01/21/2022 (Originally 09/21/2021)   COVID-19 Vaccine (5 - Moderna series) 02/04/2022 (Originally 07/19/2021)   INFLUENZA VACCINE  07/22/2022 (Originally 11/21/2021)   DEXA SCAN  01/20/2023 (Originally 09/29/2020)   HEMOGLOBIN A1C  02/24/2022   Diabetic kidney evaluation - Urine ACR  03/21/2022   TETANUS/TDAP  04/30/2027   Pneumonia Vaccine 63+ Years old  Completed   Hepatitis C Screening  Completed   Zoster Vaccines- Shingrix  Completed   HPV VACCINES  Aged Out   COLONOSCOPY (Pts 45-44yr Insurance coverage will need to be confirmed)  Discontinued   Immunization History  Administered Date(s) Administered   Influenza Split 03/02/2014   Influenza, High Dose Seasonal PF 01/18/2015, 01/05/2016, 01/09/2019, 01/18/2020   Influenza,inj,Quad PF,6+ Mos 02/03/2018   Influenza,trivalent, recombinat, inj, PF 02/05/2012, 01/21/2013   Influenza-Unspecified 02/21/2013, 01/29/2017, 02/03/2018   Moderna Covid-19 Vaccine Bivalent Booster 115yr& up 03/21/2021   Moderna Sars-Covid-2 Vaccination 05/25/2019, 06/22/2019, 02/04/2020   Pneumococcal Conjugate-13 05/05/2013   Pneumococcal Polysaccharide-23 09/15/2009   Tdap 04/29/2017   Zoster Recombinat (Shingrix) 03/05/2019, 07/21/2019   Zoster, Live 04/23/2006    These are the patient goals that we discussed:  Goals Addressed               This Visit's Progress     Patient Stated (pt-stated)        01/19/2022 AWV Goal: Diabetes Management  Patient will maintain an A1C level below 8.0 Patient will  not develop any diabetic foot complications Patient will not experience any hypoglycemic episodes over the next 3 months Patient will notify our office of any CBG readings outside of the provider recommended range by calling [phone] will adhere to provider recommendations for diabetes management  Patient Self Management Activities take all medications as prescribed and report any negative side effects monitor and record blood sugar readings as directed adhere to a low carbohydrate diet that incorporates lean proteins, vegetables, whole grains, low glycemic fruits check feet daily noting any sores, cracks, injuries, or callous formations see PCP or podiatrist if she notices any changes in her legs, feet, or toenails Patient will visit PCP and have an A1C level checked every 3 to 6 months as directed  have a yearly eye exam to monitor for vascular changes associated with diabetes and will request that the report be sent to her pcp.  consult with her PCP regarding any changes in her health or new or worsening symptoms          This is a list of Health Maintenance Items that are overdue or due now: Influenza vaccine Bone densitometry screening Foot exam Eye exam GFR measurement   Orders/Referrals Placed Today: Orders Placed This Encounter  Procedures   DEXAScan    Standing Status:   Future    Standing Expiration Date:   01/20/2023    Scheduling Instructions:     Please call patient to schedule.    Order Specific Question:   Reason for exam:    Answer:   Post Menopausal    Order Specific Question:   Preferred imaging location?  Answer:   [facility]   (Contact our referral department at [phone] if you have not spoken with someone about your referral appointment within the next 5 days)    Follow-up Plan Follow-up with [name], MD as planned Schedule your influenza vaccine at the pharmacy or a nurse visit at the office.  Bone density  referral has been sent and they will call you to schedule.  Medicare wellness visit in one year.  Patient will access AVS on my chart.     Health Maintenance, Female Adopting a healthy lifestyle and getting preventive care are important in promoting health and wellness. Ask your health care provider about: The right schedule for you to have regular tests and exams. Things you can do on your own to prevent diseases and keep yourself healthy. What should I know about diet, weight, and exercise? Eat a healthy diet  Eat a diet that includes plenty of vegetables, fruits, low-fat dairy products, and lean protein. Do not eat a lot of foods that are high in solid fats, added sugars, or sodium. Maintain a healthy weight Body mass index (BMI) is used to identify weight problems. It estimates body fat based on height and weight. Your health care provider can help determine your BMI and help you achieve or maintain a healthy weight. Get regular exercise Get regular exercise. This is one of the most important things you can do for your health. Most adults should: Exercise for at least 150 minutes each week. The exercise should increase your heart rate and make you sweat (moderate-intensity exercise). Do strengthening exercises at least twice a week. This is in addition to the moderate-intensity exercise. Spend less time sitting. Even light physical activity can be beneficial. Watch cholesterol and blood lipids Have your blood tested for lipids and cholesterol at 78 years of age, then have this test every 5 years. Have your cholesterol levels checked more often if: Your lipid or cholesterol levels are high. You are older than 78 years of age. You are at high risk for heart disease. What should I know about cancer screening? Depending on your health history and family history, you may need to have cancer screening at various ages. This may include screening for: Breast cancer. Cervical  cancer. Colorectal cancer. Skin cancer. Lung cancer. What should I know about heart disease, diabetes, and high blood pressure? Blood pressure and heart disease High blood pressure causes heart disease and increases the risk of stroke. This is more likely to develop in people who have high blood pressure readings or are overweight. Have your blood pressure checked: Every 3-5 years if you are 57-27 years of age. Every year if you are 45 years old or older. Diabetes Have regular diabetes screenings. This checks your fasting blood sugar level. Have the screening done: Once every three years after age 41 if you are at a normal weight and have a low risk for diabetes. More often and at a younger age if you are overweight or have a high risk for diabetes. What should I know about preventing infection? Hepatitis B If you have a higher risk for hepatitis B, you should be screened for this virus. Talk with your health care provider to find out if you are at risk for hepatitis B infection. Hepatitis C Testing is recommended for: Everyone born from [birth year] through 1967. Anyone with known risk factors for hepatitis C. Sexually transmitted infections (STIs) Get screened for STIs, including gonorrhea and chlamydia, if: You are sexually active  and are younger than 78 years of age. You are older than 78 years of age and your health care provider tells you that you are at risk for this type of infection. Your sexual activity has changed since you were last screened, and you are at increased risk for chlamydia or gonorrhea. Ask your health care provider if you are at risk. Ask your health care provider about whether you are at high risk for HIV. Your health care provider may recommend a prescription medicine to help prevent HIV infection. If you choose to take medicine to prevent HIV, you should first get tested for HIV. You should then be tested every 3 months for as long as you are taking the  medicine. Pregnancy If you are about to stop having your period (premenopausal) and you may become pregnant, seek counseling before you get pregnant. Take 400 to 800 micrograms (mcg) of folic acid every day if you become pregnant. Ask for birth control (contraception) if you want to prevent pregnancy. Osteoporosis and menopause Osteoporosis is a disease in which the bones lose minerals and strength with aging. This can result in bone fractures. If you are 83 years old or older, or if you are at risk for osteoporosis and fractures, ask your health care provider if you should: Be screened for bone loss. Take a calcium or vitamin D supplement to lower your risk of fractures. Be given hormone replacement therapy (HRT) to treat symptoms of menopause. Follow these instructions at home: Alcohol use Do not drink alcohol if: Your health care provider tells you not to drink. You are pregnant, may be pregnant, or are planning to become pregnant. If you drink alcohol: Limit how much you have to: 0-1 drink a day. Know how much alcohol is in your drink. In the [place] one drink equals one 12 oz bottle of beer (355 mL), one 5 oz glass of wine (148 mL), or one 1 oz glass of hard liquor (44 mL). Lifestyle Do not use any products that contain nicotine or tobacco. These products include cigarettes, chewing tobacco, and vaping devices, such as e-cigarettes. If you need help quitting, ask your health care provider. Do not use street drugs. Do not share needles. Ask your health care provider for help if you need support or information about quitting drugs. General instructions Schedule regular health, dental, and eye exams. Stay current with your vaccines. Tell your health care provider if: You often feel depressed. You have ever been abused or do not feel safe at home. Summary Adopting a healthy lifestyle and getting preventive care are important in promoting health and wellness. Follow your health care  provider's instructions about healthy diet, exercising, and getting tested or screened for diseases. Follow your health care provider's instructions on monitoring your cholesterol and blood pressure. This information is not intended to replace advice given to you by your health care provider. Make sure you discuss any questions you have with your health care provider. Document Revised: 08/29/2020 Document Reviewed: 08/29/2020 [employer] Patient Education  [employer].

## 2022-01-19 NOTE — Progress Notes (Signed)
MEDICARE ANNUAL WELLNESS VISIT  01/19/2022  Telephone Visit Disclaimer This Medicare AWV was conducted by telephone due to national recommendations for restrictions regarding the COVID-19 Pandemic (e.g. social distancing).  I verified, using two identifiers, that I am speaking with [name] or their authorized healthcare agent. I discussed the limitations, risks, security, and privacy concerns of performing an evaluation and management service by telephone and the potential availability of an in-person appointment in the future. The patient expressed understanding and agreed to proceed.  Location of Patient: Home Location of Provider (nurse):  In the office  Subjective:    Shenelle Klas is a 76 y.o. female patient of Metheney, Rene Kocher, MD who had a Medicare Annual Wellness Visit today via telephone. Eartha is Retired and lives alone. she has 5 children. she reports that she is socially active and does interact with friends/family regularly. she is minimally physically active and enjoys watching television.  Patient Care Team: [name], MD as PCP - General (Family Medicine) Park Liter, MD as PCP - Cardiology (Cardiology) Clent Jacks, MD as Referring Physician (Nephrology) Despina Pole, MD as Referring Physician (Obstetrics and Gynecology) Jed Limerick DPM as Referring Physician (Podiatry) Raina Mina, MD as Referring Physician (Nephrology)     01/19/2022    1:30 PM 10/18/2020    2:06 PM 10/03/2016   10:46 AM  Advanced Directives  Does Patient Have a Medical Advance Directive? Yes No   Type of Advance Directive Living will    Does patient want to make changes to medical advance directive? No - Patient declined    Would patient like information on creating a medical advance directive?  No - Patient declined Yes (MAU/Ambulatory/Procedural Areas - Information given)    Hospital Utilization Over the Past 12 Months: # of  hospitalizations or ER visits: 0 # of surgeries: 0  Review of Systems    Patient reports that her overall health is worse compared to last year.  History obtained from chart review and the patient  Patient Reported Readings (BP, Pulse, CBG, Weight, etc) none  Pain Assessment Pain : No/denies pain     Current Medications & Allergies (verified) Allergies as of 01/19/2022       Reactions   Amoxicillin Diarrhea, Itching   Other reaction(s): Confusion (intolerance)   Atorvastatin Other (See Comments)   Burning skin sensation   Hydrochlorothiazide    Other reaction(s): Other Chronic renal failure   Nsaids Other (See Comments)   Peptic ulcer disease   Tramadol Nausea And Vomiting   Other reaction(s): GI Upset (intolerance)   Allopurinol Other (See Comments)   Makes her stomach hurt   Morphine Other (See Comments)   Hallucinations   Omeprazole Diarrhea   Pepcid [famotidine] Other (See Comments)   Cause her to become gasy   Trulicity [dulaglutide] Other (See Comments)   Ciprofloxacin    Other reaction(s): GI Upset (intolerance) Other reaction(s): Abdominal Pain Other reaction(s): Abdominal Pain   Gabapentin Nausea Only   Feels "stupid" and wt gain    Pantoprazole Diarrhea        Medication List        Accurate as of January 19, 2022  1:52 PM. If you have any questions, ask your nurse or doctor.          acetaminophen 325 MG tablet Commonly known as: TYLENOL Take 650 mg by mouth as needed for mild pain or moderate pain.   albuterol 108 (90 Base) MCG/ACT inhaler Commonly known  as: ProAir HFA Inhale 2 puffs into the lungs every 6 (six) hours as needed for wheezing or shortness of breath. USE 2 INHALATIONS EVERY 6 HOURS AS NEEDED   allopurinol 100 MG tablet Commonly known as: ZYLOPRIM Take 1 tablet (100 mg total) by mouth daily.   amLODipine 5 MG tablet Commonly known as: NORVASC TAKE 1 TABLET DAILY   clonazePAM 1 MG tablet Commonly known as:  KLONOPIN Take 1 mg by mouth at bedtime as needed for anxiety.   colchicine 0.6 MG tablet Take 1 tablet (0.6 mg total) by mouth daily as needed. What changed: reasons to take this   diclofenac Sodium 1 % Gel Commonly known as: VOLTAREN Apply 4 g topically 4 (four) times daily.   enalapril 10 MG tablet Commonly known as: Vasotec Take 1 tablet (10 mg total) by mouth daily.   ergocalciferol 1.25 MG (50000 UT) capsule Commonly known as: VITAMIN D2 Take 50,000 Units by mouth once a week.   estradiol 0.1 MG/GM vaginal cream Commonly known as: ESTRACE Place 1 Applicatorful vaginally at bedtime. X 2 weeks and then twice a week.   insulin aspart 100 UNIT/ML injection Commonly known as: novoLOG Inject 8-20 Units into the skin 2 (two) times daily. 20 in the morning and 8 in the evenings   isosorbide mononitrate 120 MG 24 hr tablet Commonly known as: IMDUR TAKE 1 TABLET DAILY   Lantus SoloStar 100 UNIT/ML Solostar Pen Generic drug: insulin glargine Inject 30 Units into the skin daily.   levothyroxine 75 MCG tablet Commonly known as: SYNTHROID Take 1 tablet (75 mcg total) by mouth daily. What changed: Another medication with the same name was changed. Make sure you understand how and when to take each.   Synthroid 50 MCG tablet Generic drug: levothyroxine TAKE 1 TABLET TWO TIMES A WEEK. ON SUNDAY AND WEDNESDAY. ALTERNATING WITH 75 MCG THE OTHER 5 DAYS What changed: See the new instructions.   metoprolol succinate 50 MG 24 hr tablet Commonly known as: Toprol XL Take 1.5 tablets (75 mg total) by mouth daily. Take with or immediately following a meal.   montelukast 10 MG tablet Commonly known as: SINGULAIR Take 1 tablet (10 mg total) by mouth at bedtime.   Narcan 4 MG/0.1ML Liqd nasal spray kit Generic drug: naloxone Place 1 spray into the nose once.   nitroGLYCERIN 0.4 MG SL tablet Commonly known as: NITROSTAT Place 1 tablet (0.4 mg total) under the tongue every 5 (five)  minutes as needed for chest pain.   Oxycodone HCl 10 MG Tabs Take 10 mg by mouth daily.   polyethylene glycol 17 g packet Commonly known as: MIRALAX / GLYCOLAX Take 17 g by mouth as needed for mild constipation or moderate constipation.   pravastatin 20 MG tablet Commonly known as: PRAVACHOL TAKE 1 TABLET EVERY EVENING What changed:  how much to take when to take this   RABEprazole 20 MG tablet Commonly known as: Aciphex Take 1 tablet (20 mg total) by mouth daily.   tolterodine 4 MG 24 hr capsule Commonly known as: Detrol LA Take 1 capsule (4 mg total) by mouth daily.   triamcinolone 0.1 % cream : eucerin Crea Apply 1 application. topically 2 (two) times daily as needed for itching. 1 jar   Xtampza ER 18 MG C12a Generic drug: oxyCODONE ER Take 1 capsule by mouth 2 (two) times daily.        History (reviewed): Past Medical History:  Diagnosis Date   Anemia    Anemia  in stage 3 chronic kidney disease ([place]) 02/03/2018   Anemia in stage 4 chronic kidney disease ([place]) 02/03/2018   Angina pectoris ([place]) 01/11/2020   AR (allergic rhinitis) 10/02/2016   CKD (chronic kidney disease) stage 4, GFR 15-29 ml/min (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  [facility]    Gastro-esophageal reflux disease without esophagitis 06/03/2012   Gout 10/03/2016   HTN (hypertension) 06/03/2012   Hypercalcemia 03/08/2020   Hyperkalemia, diminished renal excretion 01/05/2019   Hypertension    Hypothyroidism 06/03/2012   Iron deficiency anemia 10/02/2016   Lumbar pseudoarthrosis 06/28/2016   Formatting of this note might be different from the original. Added automatically from request for surgery [id number]   Memory deficit 06/27/2018   MMSE 06/2017: 27/30. Pass is 28.     Nonspecific abnormal electrocardiogram (ECG) (EKG) 07/14/2019   Normal cardiac stress test 06/09/2012   at [facility] normal cardiac stress test (surgery preop); Dr Norman Clay   Osteoarthritis 10/02/2016   Pain in right hip 01/03/2012   Formatting of this note might be different from the original. Overview:  Right greater than left. MRI done 2013 shows right hip has partial posterior labral tear. Also has component of greater trochanteric bursitis and had corticosteroid injection with good relief in early July, 2012   Peripheral neuropathy 10/02/2016   Precordial chest pain 07/14/2019   Primary osteoarthritis of both hips 01/03/2012   Right greater than left. MRI done 2013 shows right hip has partial posterior labral tear. Also has component of greater trochanteric bursitis and had corticosteroid injection with good relief in early July, 2012    Thrombocytopenia, unspecified ([place]) 02/12/2013   Formatting of this note might be different from the original. 10/1 IMO update   Type 2 diabetes mellitus with stage 4 chronic kidney disease ([place]) 06/03/2012   Uncontrolled type 2 diabetes with renal manifestation 10/16/2019   Vitamin D deficiency    Past Surgical History:  Procedure Laterality Date   BACK SURGERY     Fibroid removed     Family History  Problem Relation Age of Onset   Diabetes Mother    Diabetes Brother    Diabetes Son    Social History   Socioeconomic History   Marital status: Widowed    Spouse name: Not on file   Number of children: 5   Years of education: 14   Highest education level: Associate degree: occupational, Hotel manager, or vocational program  Occupational History   Occupation: retired  Tobacco Use   Smoking status: Former    Types: Cigarettes    Quit date: 09/08/1979    Years since quitting: 42.3   Smokeless tobacco: Never  Substance and Sexual Activity   Alcohol use: No   Drug use: No   Sexual activity: Never  Other Topics Concern   Not on file  Social History Narrative    Lives alone. She has five children. She enjoys watching t.v.   Social Determinants of Health   Financial Resource Strain: Low Risk  (01/19/2022)   Overall Financial Resource Strain (CARDIA)    Difficulty of Paying Living Expenses: Not hard at all  Food Insecurity: No Food Insecurity (01/19/2022)   Hunger Vital Sign    Worried About Running Out of Food in the Last Year: Never true  Ran Out of Food in the Last Year: Never true  Transportation Needs: No Transportation Needs (01/19/2022)   PRAPARE - Hydrologist (Medical): No    Lack of Transportation (Non-Medical): No  Physical Activity: Inactive (01/19/2022)   Exercise Vital Sign    Days of Exercise per Week: 0 days    Minutes of Exercise per Session: 0 min  Stress: No Stress Concern Present (01/19/2022)   [place]    Feeling of Stress : Not at all  Social Connections: Moderately Integrated (01/19/2022)   Social Connection and Isolation Panel [NHANES]    Frequency of Communication with Friends and Family: More than three times a week    Frequency of Social Gatherings with Friends and Family: More than three times a week    Attends Religious Services: More than 4 times per year    Active Member of [employer] or Organizations: Yes    Attends Archivist Meetings: More than 4 times per year    Marital Status: Widowed    Activities of Daily Living    01/19/2022    1:37 PM  In your present state of health, do you have any difficulty performing the following activities:  Hearing? 0  Vision? 0  Difficulty concentrating or making decisions? 1  Walking or climbing stairs? 0  Dressing or bathing? 0  Doing errands, shopping? 0  Preparing Food and eating ? N  Using the Toilet? N  In the past six months, have you accidently leaked urine? Y  Do you have problems with loss of bowel control? N  Managing your Medications? N  Managing your  Finances? N  Housekeeping or managing your Housekeeping? N    Patient Education/ Literacy How often do you need to have someone help you when you read instructions, pamphlets, or other written materials from your doctor or pharmacy?: 1 - Never What is the last grade level you completed in school?: One year of college  Exercise Current Exercise Habits: The patient does not participate in regular exercise at present, Exercise limited by: None identified  Diet Patient reports consuming 1 meals a day and 0 snack(s) a day Patient reports that her primary diet is: Regular Patient reports that she does have regular access to food.   Depression Screen    01/19/2022    1:31 PM 08/29/2021    1:30 PM 10/18/2020    2:06 PM 08/23/2020    1:30 PM 02/23/2019    1:15 PM 10/07/2018   11:34 AM 06/27/2018    1:01 PM  PHQ 2/9 Scores  PHQ - 2 Score 0 0 0 0 0 0 1  PHQ- 9 Score       8     Fall Risk    01/19/2022    1:31 PM 08/29/2021    1:29 PM 10/18/2020    2:06 PM 08/23/2020    1:29 PM 05/26/2020    3:28 PM  [place] in the past year? 0 0 0 1 1  Number falls in past yr: 0 0 0 0 0  Injury with Fall? 0 0 0 0 0  Risk for fall due to : No Fall Risks No Fall Risks No Fall Risks Impaired balance/gait No Fall Risks  Follow up Falls evaluation completed Falls prevention discussed Falls evaluation completed Falls prevention discussed;Falls evaluation completed      Objective:  Sangeeta Youse seemed alert and oriented and she  participated appropriately during our telephone visit.  Blood Pressure Weight BMI  BP Readings from Last 3 Encounters:  12/29/21 120/62  08/29/21 120/62  07/11/21 110/62   Wt Readings from Last 3 Encounters:  12/29/21 163 lb (73.9 kg)  08/29/21 163 lb (73.9 kg)  07/11/21 168 lb (76.2 kg)   BMI Readings from Last 1 Encounters:  12/29/21 27.98 kg/m    *Unable to obtain current vital signs, weight, and BMI due to telephone visit type  Hearing/Vision  Sundeep did not  seem to have difficulty with hearing/understanding during the telephone conversation Reports that she has had a formal eye exam by an eye care professional within the past year Reports that she has not had a formal hearing evaluation within the past year *Unable to fully assess hearing and vision during telephone visit type  Cognitive Function:    01/19/2022    1:45 PM 10/18/2020    2:15 PM  6CIT Screen  What Year? 0 points 0 points  What month? 0 points 0 points  What time? 0 points 0 points  Count back from 20 0 points 0 points  Months in reverse 0 points 0 points  Repeat phrase 0 points 2 points  Total Score 0 points 2 points   (Normal:0-7, Significant for Dysfunction: >8)  Normal Cognitive Function Screening: Yes   Immunization & Health Maintenance Record Immunization History  Administered Date(s) Administered   Influenza Split 03/02/2014   Influenza, High Dose Seasonal PF 01/18/2015, 01/05/2016, 01/09/2019, 01/18/2020   Influenza,inj,Quad PF,6+ Mos 02/03/2018   Influenza,trivalent, recombinat, inj, PF 02/05/2012, 01/21/2013   Influenza-Unspecified 02/21/2013, 01/29/2017, 02/03/2018   Moderna Covid-19 Vaccine Bivalent Booster 63yr & up 03/21/2021   Moderna Sars-Covid-2 Vaccination 05/25/2019, 06/22/2019, 02/04/2020   Pneumococcal Conjugate-13 05/05/2013   Pneumococcal Polysaccharide-23 09/15/2009   Tdap 04/29/2017   Zoster Recombinat (Shingrix) 03/05/2019, 07/21/2019   Zoster, Live 04/23/2006    Health Maintenance  Topic Date Due   OPHTHALMOLOGY EXAM  01/19/2022 (Originally 06/15/2021)   FOOT EXAM  01/20/2022 (Originally 05/26/2021)   Diabetic kidney evaluation - GFR measurement  01/21/2022 (Originally 09/21/2021)   COVID-19 Vaccine (5 - Moderna series) 02/04/2022 (Originally 07/19/2021)   INFLUENZA VACCINE  07/22/2022 (Originally 11/21/2021)   DEXA SCAN  01/20/2023 (Originally 09/29/2020)   HEMOGLOBIN A1C  02/24/2022   Diabetic kidney evaluation - Urine ACR  03/21/2022    TETANUS/TDAP  04/30/2027   Pneumonia Vaccine 76 Years old  Completed   Hepatitis C Screening  Completed   Zoster Vaccines- Shingrix  Completed   HPV VACCINES  Aged Out   COLONOSCOPY (Pts [id number] coverage will need to be confirmed)  Discontinued       Assessment  This is a routine wellness examination for [facility] Health Maintenance: Due or Overdue There are no preventive care reminders to display for this patient.   GeAivy Akteroes not need a referral for Community Assistance: Care Management:   no Social Work:    no Prescription Assistance:  no Nutrition/Diabetes Education:  no   Plan:  Personalized Goals  Goals Addressed               This Visit's Progress     Patient Stated (pt-stated)        01/19/2022 AWV Goal: Diabetes Management  Patient will maintain an A1C level below 8.0 Patient will not develop any diabetic foot complications Patient will not experience any hypoglycemic episodes over the next 3 months Patient will notify our office of any CBG readings  outside of the provider recommended range by calling [phone] Patient will adhere to provider recommendations for diabetes management  Patient Self Management Activities take all medications as prescribed and report any negative side effects monitor and record blood sugar readings as directed adhere to a low carbohydrate diet that incorporates lean proteins, vegetables, whole grains, low glycemic fruits check feet daily noting any sores, cracks, injuries, or callous formations see PCP or podiatrist if she notices any changes in her legs, feet, or toenails Patient will visit PCP and have an A1C level checked every 3 to 6 months as directed  have a yearly eye exam to monitor for vascular changes associated with diabetes and will request that the report be sent to her pcp.  consult with her PCP regarding any changes in her health or new or worsening symptoms         Personalized Health Maintenance & Screening Recommendations  Influenza vaccine Bone densitometry screening Foot exam Eye exam GFR measurement  Lung Cancer Screening Recommended: no (Low Dose CT Chest recommended if Age 64-80 years, 30 pack-year currently smoking OR have quit w/in past 15 years) Hepatitis C Screening recommended: no HIV Screening recommended: no  Advanced Directives: Written information was not prepared per patient's request.  Referrals & Orders No orders of the defined types were placed in this encounter.   Follow-up Plan Follow-up with [name], MD as planned Schedule your influenza vaccine at the pharmacy or a nurse visit at the office.  Bone density referral has been sent and they will call you to schedule.  Medicare wellness visit in one year.  Patient will access AVS on my chart.   I have personally reviewed and noted the following in the patient's chart:   Medical and social history Use of alcohol, tobacco or illicit drugs  Current medications and supplements Functional ability and status Nutritional status Physical activity Advanced directives List of other physicians Hospitalizations, surgeries, and ER visits in previous 12 months Vitals Screenings to include cognitive, depression, and falls Referrals and appointments  In addition, I have reviewed and discussed with [name] certain preventive protocols, quality metrics, and best practice recommendations. A written personalized care plan for preventive services as well as general preventive health recommendations is available and can be mailed to the patient at her request.      Tinnie Gens, RN BSN  01/19/2022

## 2022-01-23 ENCOUNTER — Ambulatory Visit (HOSPITAL_BASED_OUTPATIENT_CLINIC_OR_DEPARTMENT_OTHER)
Admission: RE | Admit: 2022-01-23 | Discharge: 2022-01-23 | Disposition: A | Discharge status: Home / Self Care | Payer: Medicare Other | Source: Ambulatory Visit | Attending: Cardiology | Admitting: Cardiology

## 2022-01-23 DIAGNOSIS — R0609 Other forms of dyspnea: Secondary | ICD-10-CM | POA: Diagnosis present

## 2022-01-23 NOTE — Progress Notes (Signed)
  Echocardiogram 2D Echocardiogram has been performed.  Merrie Roof F 01/23/2022, 3:23 PM

## 2022-01-24 LAB — ECHOCARDIOGRAM COMPLETE
AR max vel: 2.12 cm2
AV Area VTI: 2.02 cm2
AV Area mean vel: 2.04 cm2
AV Mean grad: 3 mmHg
AV Peak grad: 6 mmHg
Ao pk vel: 1.22 m/s
Area-P 1/2: 2.88 cm2
S' Lateral: 2.9 cm

## 2022-01-25 ENCOUNTER — Telehealth: Payer: Self-pay

## 2022-01-25 NOTE — Telephone Encounter (Signed)
LVM regarding normal results per DPR - per Dr. Wendy Poet note. Routed to PCP.

## 2022-01-31 LAB — MICROALBUMIN / CREATININE URINE RATIO: Microalb Creat Ratio: 77

## 2022-01-31 LAB — PROTEIN / CREATININE RATIO, URINE
Albumin, U: 250.4
Creatinine, Urine: 325.2

## 2022-02-03 ENCOUNTER — Other Ambulatory Visit: Payer: Self-pay | Admitting: Cardiology

## 2022-02-05 NOTE — Telephone Encounter (Signed)
Isosorbide refill to pharmacy

## 2022-02-20 ENCOUNTER — Other Ambulatory Visit: Payer: Self-pay | Admitting: Family Medicine

## 2022-03-06 ENCOUNTER — Encounter: Payer: Self-pay | Admitting: Family Medicine

## 2022-03-06 ENCOUNTER — Ambulatory Visit (INDEPENDENT_AMBULATORY_CARE_PROVIDER_SITE_OTHER): Payer: Medicare Other | Admitting: Family Medicine

## 2022-03-06 VITALS — BP 132/53 | HR 89 | Ht 64.0 in | Wt 159.0 lb

## 2022-03-06 DIAGNOSIS — I10 Essential (primary) hypertension: Secondary | ICD-10-CM | POA: Diagnosis not present

## 2022-03-06 DIAGNOSIS — Z23 Encounter for immunization: Secondary | ICD-10-CM | POA: Diagnosis not present

## 2022-03-06 DIAGNOSIS — N184 Chronic kidney disease, stage 4 (severe): Secondary | ICD-10-CM | POA: Diagnosis not present

## 2022-03-06 DIAGNOSIS — E785 Hyperlipidemia, unspecified: Secondary | ICD-10-CM | POA: Diagnosis not present

## 2022-03-06 DIAGNOSIS — E1122 Type 2 diabetes mellitus with diabetic chronic kidney disease: Secondary | ICD-10-CM

## 2022-03-06 DIAGNOSIS — M1A0790 Idiopathic chronic gout, unspecified ankle and foot, without tophus (tophi): Secondary | ICD-10-CM

## 2022-03-06 DIAGNOSIS — I209 Angina pectoris, unspecified: Secondary | ICD-10-CM | POA: Diagnosis not present

## 2022-03-06 DIAGNOSIS — Z794 Long term (current) use of insulin: Secondary | ICD-10-CM | POA: Diagnosis not present

## 2022-03-06 NOTE — Assessment & Plan Note (Signed)
Well controlled. Continue current regimen. Follow up in  6 mo  

## 2022-03-06 NOTE — Assessment & Plan Note (Signed)
Recheck Uric acid since on higher dose of allopurinol now.

## 2022-03-06 NOTE — Assessment & Plan Note (Signed)
So far doing well on Mounjaro.  Tolerate well compared to Trulicity.

## 2022-03-06 NOTE — Assessment & Plan Note (Signed)
Due for updated lipid check.

## 2022-03-06 NOTE — Patient Instructions (Signed)
Ok to do labs another time when fasting.

## 2022-03-06 NOTE — Progress Notes (Signed)
Established Patient Office Visit  Subjective   Patient ID: Barbara Yu, female    DOB: [birth year]  Age: 76 y.o. MRN: [id number]  Chief Complaint  Patient presents with   Hypertension    HPI  Gout-she has been having some more frequent gout flares.  She says in fact the other night she had some cashews and it caused intense great toe pain.  She says she is usually able to take a couple colchicine and then repeat the dose in the morning and then it seems to ease off it does not usually last more than several hours or through the night and then it seems to improve.  Her allopurinol was recently increased.  He is currently taking 400 mg daily.  Hypertension- Pt denies chest pain, SOB, dizziness, or heart palpitations.  Taking meds as directed w/o problems.  Denies medication side effects.  The allopurinol may be causing some swelling so she is not sure if she is actively taking that 1 or not.  Diabetes - no hypoglycemic events. No wounds or sores that are not healing well. No increased thirst or urination. Checking glucose at home. Taking medications as prescribed without any side effects.  Last A1c 2 months ago was 8.0.  She is now on Mounjaro.  She is a new start to the medication and so far actually has been tolerating it well and better than the Trulicity which she was previously taking.     ROS    Objective:     BP (!) 132/53   Pulse 89   Ht _0  (1.626 m)   Wt 159 lb (72.1 kg)   SpO2 97%   BMI 27.29 kg/m    Physical Exam Vitals and nursing note reviewed.  Constitutional:      Appearance: She is well-developed.  HENT:     Head: Normocephalic and atraumatic.  Cardiovascular:     Rate and Rhythm: Normal rate and regular rhythm.     Heart sounds: Normal heart sounds.  Pulmonary:     Effort: Pulmonary effort is normal.     Breath sounds: Normal breath sounds.  Skin:    General: Skin is warm and dry.  Neurological:     Mental Status: She is alert and oriented  to person, place, and time.  Psychiatric:        Behavior: Behavior normal.     Results for orders placed or performed in visit on 03/06/22  Hemoglobin A1c  Result Value Ref Range   Hemoglobin A1C 8.0   TSH  Result Value Ref Range   TSH 3.39 0.41 - 5.90  VITAMIN D 25 Hydroxy (Vit-D Deficiency, Fractures)  Result Value Ref Range   Vit D, 25-Hydroxy 41.9   Microalbumin / creatinine urine ratio  Result Value Ref Range   Microalb Creat Ratio 77   Protein / creatinine ratio, urine  Result Value Ref Range   Creatinine, Urine 325.2    Albumin, U 509.3   Basic metabolic panel  Result Value Ref Range   Glucose 208    BUN 22 (A) 4 - 21   CO2 19 13 - 22   Creatinine 1.7 (A) 0.5 - 1.1   Potassium 5.0 3.5 - 5.1 mEq/L   Sodium 141 137 - 147   Chloride 106 99 - 108  Comprehensive metabolic panel  Result Value Ref Range   eGFR 31    Calcium 9.7 8.7 - 10.7   Albumin 4.9 3.5 - 5.0  Vitamin B12  Result Value Ref Range   Vitamin B-12 801       The 10-year ASCVD risk score (Arnett DK, et al., 2019) is: 39.4%    Assessment & Plan:   Problem List Items Addressed This Visit       Cardiovascular and Mediastinum   HTN (hypertension)    Well controlled. Continue current regimen. Follow up in  24mo      Relevant Medications   pravastatin (PRAVACHOL) 10 MG tablet     Endocrine   Type 2 diabetes mellitus with stage 4 chronic kidney disease ([place]    So far doing well on Mounjaro.  Tolerate well compared to Trulicity.        Relevant Medications   MOUNJARO 2.5 MG/0.5ML Pen   pravastatin (PRAVACHOL) 10 MG tablet   Other Relevant Orders   Lipid Panel w/reflex Direct LDL     Other   Gout    Recheck Uric acid since on higher dose of allopurinol now.       Relevant Orders   Uric acid   Dyslipidemia    Due for updated lipid check.       Relevant Medications   pravastatin (PRAVACHOL) 10 MG tablet   Other Relevant Orders   Lipid Panel w/reflex Direct LDL   Other Visit  Diagnoses     Need for pneumococcal 20-valent conjugate vaccination    -  Primary   Relevant Orders   Pneumococcal conjugate vaccine 20-valent (Prevnar 20) (Completed)       Return in about 6 months (around 09/04/2022) for Diabetes follow-up, Hypertension.    CBeatrice Lecher MD

## 2022-03-09 ENCOUNTER — Other Ambulatory Visit: Payer: Medicare Other

## 2022-03-14 ENCOUNTER — Telehealth: Payer: Self-pay | Admitting: *Deleted

## 2022-03-14 DIAGNOSIS — E119 Type 2 diabetes mellitus without complications: Secondary | ICD-10-CM

## 2022-03-14 MED ORDER — FREESTYLE LITE TEST VI STRP: ORAL_STRIP | 4 refills | Status: AC

## 2022-03-14 NOTE — Telephone Encounter (Signed)
Pt called and asked for a refill on the testing strips for her meter to be sent to her mail order pharmacy.

## 2022-04-08 ENCOUNTER — Other Ambulatory Visit: Payer: Self-pay | Admitting: Family Medicine

## 2022-04-08 DIAGNOSIS — E1122 Type 2 diabetes mellitus with diabetic chronic kidney disease: Secondary | ICD-10-CM

## 2022-04-11 ENCOUNTER — Telehealth: Payer: Self-pay | Admitting: Family Medicine

## 2022-04-11 DIAGNOSIS — E872 Acidosis, unspecified: Secondary | ICD-10-CM | POA: Insufficient documentation

## 2022-04-11 DIAGNOSIS — U071 COVID-19: Secondary | ICD-10-CM | POA: Insufficient documentation

## 2022-04-11 HISTORY — DX: COVID-19: U07.1

## 2022-04-11 NOTE — Telephone Encounter (Signed)
Pt in hospital with elevated seriume creatinine.  + COVID

## 2022-04-17 ENCOUNTER — Telehealth: Payer: Self-pay

## 2022-04-17 NOTE — Telephone Encounter (Signed)
Transition Care Management Unsuccessful Follow-up Telephone Call  Date of discharge and from where:  04/14/2022 from [facility]  Attempts:  1st Attempt  Reason for unsuccessful TCM follow-up call:  Left voice message

## 2022-04-18 NOTE — Telephone Encounter (Signed)
Transition Care Management Unsuccessful Follow-up Telephone Call  Date of discharge and from where:  04/14/2022 from [facility]  Attempts:  2nd Attempt  Reason for unsuccessful TCM follow-up call:  Unable to leave message

## 2022-04-20 NOTE — Telephone Encounter (Signed)
Transition Care Management Follow-up Telephone Call Date of discharge and from where: 04/14/2022 from [place] How have you been since you were released from the hospital? Patient stated that she was feeling better and did not have any questions or concerns at this time.  Any questions or concerns? No  Items Reviewed: Did the pt receive and understand the discharge instructions provided? Yes  Medications obtained and verified? Yes  Other? No  Any new allergies since your discharge? No  Dietary orders reviewed? No Do you have support at home? Yes   Functional Questionnaire: (I = Independent and D = Dependent) ADLs: I  Bathing/Dressing- I  Meal Prep- I  Eating- I  Maintaining continence- I  Transferring/Ambulation- I  Managing Meds- I   Follow up appointments reviewed:  PCP Hospital f/u appt confirmed? No  Specialist Hospital f/u appt confirmed? Yes  [facility] on 05/15/2022 @ 1:30pm. Are transportation arrangements needed? No  If their condition worsens, is the pt aware to call PCP or go to the Emergency Dept.? Yes Was the patient provided with contact information for the PCP's office or ED? Yes Was to pt encouraged to call back with questions or concerns? Yes

## 2022-04-25 ENCOUNTER — Encounter: Payer: Self-pay | Admitting: Family Medicine

## 2022-04-25 ENCOUNTER — Other Ambulatory Visit: Payer: Self-pay | Admitting: *Deleted

## 2022-04-25 DIAGNOSIS — Z794 Long term (current) use of insulin: Secondary | ICD-10-CM

## 2022-04-25 MED ORDER — GLUCOSE BLOOD VI STRP: ORAL_STRIP | 4 refills | Status: DC

## 2022-05-02 ENCOUNTER — Other Ambulatory Visit: Payer: Self-pay | Admitting: *Deleted

## 2022-05-02 MED ORDER — LANSOPRAZOLE 15 MG PO CPDR: 15.0000 mg | DELAYED_RELEASE_CAPSULE | Freq: Every day | ORAL | 3 refills | Status: DC

## 2022-05-02 MED ORDER — TRIAMCINOLONE 0.1 % CREAM:EUCERIN CREAM 1:1: 1.0000 | TOPICAL_CREAM | Freq: Two times a day (BID) | CUTANEOUS | 99 refills | Status: DC | PRN

## 2022-05-22 LAB — T4: T4, Total: 8.6

## 2022-05-22 LAB — BASIC METABOLIC PANEL
BUN: 29 — AB (ref 4–21)
CO2: 14 (ref 13–22)
Chloride: 110 — AB (ref 99–108)
Creatinine: 1.8 — AB (ref 0.5–1.1)
Glucose: 141
Potassium: 4.5 mEq/L (ref 3.5–5.1)
Sodium: 143 (ref 137–147)

## 2022-05-22 LAB — COMPREHENSIVE METABOLIC PANEL
Albumin: 4.6 (ref 3.5–5.0)
Calcium: 9.4 (ref 8.7–10.7)
Globulin: 2.5
eGFR: 29

## 2022-05-22 LAB — HEPATIC FUNCTION PANEL
ALT: 31 U/L (ref 7–35)
AST: 41 — AB (ref 13–35)
Alkaline Phosphatase: 75 (ref 25–125)
Bilirubin, Total: 0.2

## 2022-05-22 LAB — HEMOGLOBIN A1C: Hemoglobin A1C: 7.2

## 2022-05-22 LAB — FREE THYROXINE INDEX: Free Thyroxine Index: 2.6

## 2022-05-22 LAB — T4 (THYROXINE), TOTAL (REFL): T4, Total: 8.6

## 2022-05-22 LAB — TSH: TSH: 0.9 (ref 0.41–5.90)

## 2022-05-22 LAB — T3 UPTAKE: T3 Uptake: 30

## 2022-06-14 ENCOUNTER — Encounter: Payer: Self-pay | Admitting: Family Medicine

## 2022-06-14 ENCOUNTER — Ambulatory Visit (INDEPENDENT_AMBULATORY_CARE_PROVIDER_SITE_OTHER): Payer: Medicare Other | Admitting: Family Medicine

## 2022-06-14 VITALS — BP 117/68 | HR 87 | Ht 64.0 in | Wt 149.0 lb

## 2022-06-14 DIAGNOSIS — G8929 Other chronic pain: Secondary | ICD-10-CM | POA: Diagnosis not present

## 2022-06-14 DIAGNOSIS — M961 Postlaminectomy syndrome, not elsewhere classified: Secondary | ICD-10-CM

## 2022-06-14 DIAGNOSIS — M1A0790 Idiopathic chronic gout, unspecified ankle and foot, without tophus (tophi): Secondary | ICD-10-CM

## 2022-06-14 MED ORDER — OXYCODONE HCL 10 MG PO TABS: 10.0000 mg | ORAL_TABLET | Freq: Four times a day (QID) | ORAL | 0 refills | Status: DC

## 2022-06-14 MED ORDER — XTAMPZA ER 18 MG PO C12A: 1.0000 | EXTENDED_RELEASE_CAPSULE | Freq: Two times a day (BID) | ORAL | 0 refills | Status: DC

## 2022-06-14 NOTE — Assessment & Plan Note (Signed)
She has also been having a flar in her big toe with her gout lately. She recently started [employer] and feels it has been helping. She is finally getting some relief.

## 2022-06-14 NOTE — Assessment & Plan Note (Signed)
Some spinal stenosis-MRI 2013 Surgery 06/11/12 Dr Prince Rome L3-S1 PSF, L34 and L5-S1 Bilateral decompression with stryker, s/p L4-5 PLIF stryker.  [place]

## 2022-06-14 NOTE — Assessment & Plan Note (Addendum)
Indication for chronic opioid: Failed back surgical syndrome Medication and dose: Xtampza ER 18 mg BID.  Oxycodone 10 mg 4 times daily # pills per month: 60, 120 Last UDS date: 06/14/22 Opioid Treatment Agreement signed (Y/N): y Opioid Treatment Agreement last reviewed with patient:  06/14/22 NCCSRS reviewed this encounter (include red flags):  Yes

## 2022-06-14 NOTE — Progress Notes (Signed)
Established Patient Office Visit  Subjective   Patient ID: Barbara Yu, female    DOB: 11-Sep-1945  Age: 77 y.o. MRN: [id number]  Chief Complaint  Patient presents with   Pain Management         HPI  Barbara Yu has been my patient for several years.  She has been going to chronic pain management for several years at [facility], for failed back surgery she is a lot of discomfort and pain particularly in her lower back and radiating down into her legs.  She says she is not very active.  But the medication does help control her pain.  She is currently on Xtampza twice a day and then has short acting oxycodone 10 mg that she takes up to 4 times a day as needed.  She says that it does help control her pain so that she can continue to function.  She says if she needs to drive somewhere she hold off on taking the oxycodone until she gets home.  Specific activities around the house including washing dishes making her bed are really painful for her back.  Ending the dryer is also very difficult.  She says she can no longer bend over and scrub the bathtub she has to mop it to clean it.  She is not very physically active because of her pain and would be interested in some formal physical therapy if they could come to the house.  She drives very infrequently and does not go far from the home.  Days she feels like her pain is okay.  She reports that sometimes she will use her heating pad especially at night when she first gets in bed to try to relax the muscles and get some pain relief.  She also uses Tylenol occasionally at home she says on bad days. '  DM- recently had to adjust her insulin down because of low sugars. She has a CGM that has been really helpful.      ROS    Objective:     BP 117/68   Pulse 87   Ht 5' 4"$  (1.626 m)   Wt 149 lb (67.6 kg)   SpO2 97%   BMI 25.58 kg/m    Physical Exam Vitals and nursing note reviewed.  Constitutional:      Appearance: She is  well-developed.  HENT:     Head: Normocephalic and atraumatic.  Cardiovascular:     Rate and Rhythm: Normal rate and regular rhythm.     Heart sounds: Normal heart sounds.  Pulmonary:     Effort: Pulmonary effort is normal.     Breath sounds: Normal breath sounds.  Skin:    General: Skin is warm and dry.  Neurological:     Mental Status: She is alert and oriented to person, place, and time.  Psychiatric:        Behavior: Behavior normal.      No results found for any visits on 06/14/22.    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 She recently started [employer] and feels it has been helping. She is finally getting some relief.        Failed back surgical syndrome    Some  spinal stenosis-MRI 2013 Surgery 06/11/12 Dr Prince Rome L3-S1 PSF, L34 and L5-S1 Bilateral decompression with stryker, s/p L4-5 PLIF stryker.  [place]      Relevant Medications   XTAMPZA ER 18 MG C12A (Start on 08/11/2022)   Oxycodone HCl 10 MG TABS (Start on 08/11/2022)   Oxycodone HCl 10 MG TABS (Start on 07/12/2022)   XTAMPZA ER 18 MG C12A (Start on 07/12/2022)   Oxycodone HCl 10 MG TABS   XTAMPZA ER 18 MG C12A   Other Relevant Orders   Ambulatory referral to [place]   Encounter for chronic pain management - Primary    Indication for chronic opioid: Failed back surgical syndrome Medication and dose: Xtampza ER 18 mg BID.  Oxycodone 10 mg 4 times daily # pills per month: 60, 120 Last UDS date: 06/14/22 Opioid Treatment Agreement signed (Y/N): y Opioid Treatment Agreement last reviewed with patient:  06/14/22 NCCSRS reviewed this encounter (include red flags):  Yes       Relevant Orders   Ambulatory referral to [place], PANEL 6 WITH CONFIRMATION, URINE    Return in about 3 months  (around 09/12/2022) for chronic Pain Management .  I spent 40 minutes on the day of the encounter to include pre-visit record review, face-to-face time with the patient and post visit ordering of test.   Beatrice Lecher, MD

## 2022-06-17 LAB — DRUG MONITORING, PANEL 6 WITH CONFIRMATION, URINE
6 Acetylmorphine: NEGATIVE ng/mL (ref <10)
Alcohol Metabolites: NEGATIVE ng/mL (ref <500)
Alphahydroxyalprazolam: NEGATIVE ng/mL (ref <25)
Alphahydroxymidazolam: NEGATIVE ng/mL (ref <50)
Alphahydroxytriazolam: NEGATIVE ng/mL (ref <50)
Aminoclonazepam: NEGATIVE ng/mL (ref <25)
Amphetamines: NEGATIVE ng/mL (ref <500)
Barbiturates: NEGATIVE ng/mL (ref <300)
Benzodiazepines: POSITIVE ng/mL — AB (ref <100)
Cocaine Metabolite: NEGATIVE ng/mL (ref <150)
Codeine: NEGATIVE ng/mL (ref <50)
Creatinine: >300 mg/dL (ref >=20.0)
Hydrocodone: NEGATIVE ng/mL (ref <50)
Hydromorphone: NEGATIVE ng/mL (ref <50)
Hydroxyethylflurazepam: NEGATIVE ng/mL (ref <50)
Lorazepam: NEGATIVE ng/mL (ref <50)
Marijuana Metabolite: NEGATIVE ng/mL (ref <20)
Methadone Metabolite: NEGATIVE ng/mL (ref <100)
Morphine: NEGATIVE ng/mL (ref <50)
Nordiazepam: 840 ng/mL — ABNORMAL HIGH (ref <50)
Norhydrocodone: NEGATIVE ng/mL (ref <50)
Noroxycodone: >10000 ng/mL — ABNORMAL HIGH (ref <50)
Opiates: NEGATIVE ng/mL (ref <100)
Oxazepam: 1340 ng/mL — ABNORMAL HIGH (ref <50)
Oxidant: NEGATIVE ug/mL (ref <200)
Oxycodone: >10000 ng/mL — ABNORMAL HIGH (ref <50)
Oxycodone: POSITIVE ng/mL — AB (ref <100)
Oxymorphone: 8160 ng/mL — ABNORMAL HIGH (ref <50)
Phencyclidine: NEGATIVE ng/mL (ref <25)
Temazepam: 2498 ng/mL — ABNORMAL HIGH (ref <50)
pH: 5.5 (ref 4.5–9.0)

## 2022-06-17 LAB — DM TEMPLATE

## 2022-06-21 ENCOUNTER — Other Ambulatory Visit: Payer: Self-pay | Admitting: Family Medicine

## 2022-06-21 DIAGNOSIS — E1122 Type 2 diabetes mellitus with diabetic chronic kidney disease: Secondary | ICD-10-CM

## 2022-06-21 MED ORDER — LANTUS SOLOSTAR 100 UNIT/ML ~~LOC~~ SOPN: 15.0000 [IU] | PEN_INJECTOR | Freq: Every day | SUBCUTANEOUS | Status: AC

## 2022-06-21 NOTE — Progress Notes (Signed)
Will send updated medication list to home health.

## 2022-06-25 DIAGNOSIS — M961 Postlaminectomy syndrome, not elsewhere classified: Secondary | ICD-10-CM | POA: Diagnosis not present

## 2022-06-25 DIAGNOSIS — M160 Bilateral primary osteoarthritis of hip: Secondary | ICD-10-CM | POA: Diagnosis not present

## 2022-07-04 ENCOUNTER — Other Ambulatory Visit: Payer: Self-pay | Admitting: Family Medicine

## 2022-07-04 ENCOUNTER — Encounter: Payer: Self-pay | Admitting: Cardiology

## 2022-07-04 ENCOUNTER — Ambulatory Visit: Payer: Medicare Other | Admitting: Cardiology

## 2022-07-05 ENCOUNTER — Telehealth: Payer: Self-pay | Admitting: *Deleted

## 2022-07-05 ENCOUNTER — Other Ambulatory Visit: Payer: Self-pay | Admitting: Family Medicine

## 2022-07-05 DIAGNOSIS — N952 Postmenopausal atrophic vaginitis: Secondary | ICD-10-CM

## 2022-07-05 DIAGNOSIS — N760 Acute vaginitis: Secondary | ICD-10-CM

## 2022-07-05 MED ORDER — TRIAMCINOLONE 0.1 % CREAM:EUCERIN CREAM 1:1: 1.0000 | TOPICAL_CREAM | Freq: Two times a day (BID) | CUTANEOUS | 99 refills | Status: DC | PRN

## 2022-07-05 MED ORDER — ESTRADIOL 0.1 MG/GM VA CREA: 1.0000 | TOPICAL_CREAM | Freq: Every day | VAGINAL | 3 refills | Status: AC

## 2022-07-05 NOTE — Progress Notes (Signed)
Meds ordered this encounter  Medications   Triamcinolone Acetonide (TRIAMCINOLONE 0.1 % CREAM : EUCERIN) CREA    Sig: Apply 1 Application topically 2 (two) times daily as needed for itching. 1 jar. Mix 1:1    Dispense:  1 each    Refill:  PRN    This is a compounded medication.

## 2022-07-05 NOTE — Telephone Encounter (Signed)
Error

## 2022-07-11 ENCOUNTER — Other Ambulatory Visit: Payer: Self-pay | Admitting: *Deleted

## 2022-07-11 MED ORDER — AMLODIPINE BESYLATE 5 MG PO TABS: 5.0000 mg | ORAL_TABLET | Freq: Every day | ORAL | 3 refills | Status: DC

## 2022-07-20 ENCOUNTER — Other Ambulatory Visit: Payer: Self-pay | Admitting: Family Medicine

## 2022-07-20 DIAGNOSIS — J309 Allergic rhinitis, unspecified: Secondary | ICD-10-CM

## 2022-08-01 LAB — HM DIABETES EYE EXAM

## 2022-08-10 ENCOUNTER — Encounter: Payer: Self-pay | Admitting: Family Medicine

## 2022-08-20 ENCOUNTER — Telehealth: Payer: Self-pay | Admitting: *Deleted

## 2022-08-20 NOTE — Telephone Encounter (Signed)
Spoke w/Melinda at [facility] home [facility] (Florentina Addison was in a meeting) and gave her VO for continuation of care for skilled nursing.    She will document and forward this to [place].

## 2022-09-04 ENCOUNTER — Encounter: Payer: Self-pay | Admitting: Family Medicine

## 2022-09-04 ENCOUNTER — Ambulatory Visit (INDEPENDENT_AMBULATORY_CARE_PROVIDER_SITE_OTHER): Payer: Medicare Other | Admitting: Family Medicine

## 2022-09-04 ENCOUNTER — Ambulatory Visit (INDEPENDENT_AMBULATORY_CARE_PROVIDER_SITE_OTHER): Payer: Medicare Other

## 2022-09-04 VITALS — BP 118/74 | HR 92 | Ht 64.0 in | Wt 142.7 lb

## 2022-09-04 DIAGNOSIS — M25561 Pain in right knee: Secondary | ICD-10-CM

## 2022-09-04 DIAGNOSIS — M79641 Pain in right hand: Secondary | ICD-10-CM

## 2022-09-04 DIAGNOSIS — M25562 Pain in left knee: Secondary | ICD-10-CM

## 2022-09-04 DIAGNOSIS — G8929 Other chronic pain: Secondary | ICD-10-CM

## 2022-09-04 MED ORDER — OXYCODONE HCL 10 MG PO TABS: 10.0000 mg | ORAL_TABLET | Freq: Four times a day (QID) | ORAL | 0 refills | Status: DC

## 2022-09-04 MED ORDER — OXYCODONE HCL 10 MG PO TABS
10.0000 mg | ORAL_TABLET | Freq: Four times a day (QID) | ORAL | 0 refills | Status: DC
Start: 2022-11-08 — End: 2022-12-07

## 2022-09-04 MED ORDER — XTAMPZA ER 27 MG PO C12A
1.0000 | EXTENDED_RELEASE_CAPSULE | Freq: Two times a day (BID) | ORAL | 0 refills | Status: DC
Start: 2022-09-10 — End: 2022-10-15

## 2022-09-04 NOTE — Assessment & Plan Note (Addendum)
Discussed inc the Ztampa to 27mg    Indication for chronic opioid: Failed back surgical syndrome Medication and dose: Xtampza ER 27 mg BID.  Oxycodone 10 mg 4 times daily # pills per month: 60, 120 Last UDS date: 06/14/22 Opioid Treatment Agreement signed (Y/N): y Opioid Treatment Agreement last reviewed with patient:  06/14/22 NCCSRS reviewed this encounter (include red flags):  Yes

## 2022-09-04 NOTE — Progress Notes (Signed)
Established Patient Office Visit  Subjective   Patient ID: Barbara Yu, female    DOB: 08-Feb-1946  Age: 78 y.o. MRN: [id number]  Chief Complaint  Patient presents with   Pain Management        Knee Pain    Bilateral knee pain would like to have imaging done    HPI  Barbara Yu has been my patient for several years.  She has been going to chronic pain management for several years at [facility], for failed back surgery she is a lot of discomfort and pain particularly in her lower back and radiating down into her legs.  She says she is not very active.  But the medication does help control her pain.  She is currently on Xtampza twice a day and then has short acting oxycodone 10 mg that she takes up to 4 times a day as needed.  She says that it does help control her pain so that she can continue to function.  She says if she needs to drive somewhere she hold off on taking the oxycodone until she gets home. Also uses heate and ice at night. She feels like the [employer] work well. Feels get more relief from the oxy 10mg  dose.   Last A1C 7.2 1 mo ago with Dr. Jamelle Rushing.  She is doing well.   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  He is not interested in any attempt surgical treatment but would be open to injections.  Rheumatologist is working on getting her back on one of the newer medication treatments for gout.  She did it in the past but it was very expensive but she did really well while she was on it and had a lot of relief with her gout.  Right now she says sometimes her toes hurt so bad she really wants to cut them off and she has been experiencing more pain in her right hand lately.    ROS    Objective:     BP 118/74   Pulse 92   Ht 5\' 4"  (1.626 m)   Wt 142 lb 11.2 oz (64.7 kg)   SpO2 97%   BMI 24.49 kg/m    Physical Exam Vitals and nursing note reviewed.  Constitutional:      Appearance: She is  well-developed.  HENT:     Head: Normocephalic and atraumatic.  Cardiovascular:     Rate and Rhythm: Normal rate and regular rhythm.     Heart sounds: Normal heart sounds.  Pulmonary:     Effort: Pulmonary effort is normal.     Breath sounds: Normal breath sounds.  Skin:    General: Skin is warm and dry.  Neurological:     Mental Status: She is alert and oriented to person, place, and time.  Psychiatric:        Behavior: Behavior normal.      No results found for any visits on 09/04/22.    The 10-year ASCVD risk score (Arnett DK, et al., 2019) is: 35.2%    Assessment & Plan:   Problem List Items Addressed This Visit       Other   Encounter for chronic pain management - Primary    Discussed inc the Ztampa to 27mg    Indication for chronic opioid: Failed back surgical syndrome Medication and dose: Xtampza ER 27 mg BID.  Oxycodone 10 mg 4 times daily # pills per month:  60, 120 Last UDS date: 06/14/22 Opioid Treatment Agreement signed (Y/N): y Opioid Treatment Agreement last reviewed with patient:  06/14/22 NCCSRS reviewed this encounter (include red flags):  Yes      Relevant Medications   Oxycodone HCl 10 MG TABS (Start on 09/10/2022)   Oxycodone HCl 10 MG TABS (Start on 10/10/2022)   Oxycodone HCl 10 MG TABS (Start on 11/08/2022)   oxyCODONE ER (XTAMPZA ER) 27 MG C12A (Start on 09/10/2022)   Other Visit Diagnoses     Chronic pain of both knees       Relevant Medications   Oxycodone HCl 10 MG TABS (Start on 09/10/2022)   Oxycodone HCl 10 MG TABS (Start on 10/10/2022)   Oxycodone HCl 10 MG TABS (Start on 11/08/2022)   oxyCODONE ER (XTAMPZA ER) 27 MG C12A (Start on 09/10/2022)   Other Relevant Orders   DG Knee Complete 4 Views Left   DG Knee Complete 4 Views Right   Right hand pain       Relevant Orders   DG Hand Complete Right      Bilateral knee pain-will start with knee films.  Suspect severe OA she might actually do really well with Synvisc injections and we  can always get her referred for that but since it has been years since she has had any type of imaging we will start there.  Return in about 3 months (around 12/05/2022).    Nani Gasser, MD

## 2022-09-10 NOTE — Progress Notes (Signed)
HI Mrs. Barkely then xray of your knees show arthritis in all 3 compartments.  You have some spurs as well.  We can schedule you with DR. T for possible injection for pain relief.

## 2022-09-10 NOTE — Progress Notes (Signed)
There is a lot of arthritis at the base of the thumb but other joints look OK.  We can have Dr. Karie Schwalbe makes suggests as well.

## 2022-10-15 ENCOUNTER — Other Ambulatory Visit: Payer: Self-pay | Admitting: *Deleted

## 2022-10-15 ENCOUNTER — Other Ambulatory Visit: Payer: Self-pay | Admitting: Family Medicine

## 2022-10-15 DIAGNOSIS — G8929 Other chronic pain: Secondary | ICD-10-CM

## 2022-10-15 MED ORDER — XTAMPZA ER 27 MG PO C12A
1.0000 | EXTENDED_RELEASE_CAPSULE | Freq: Two times a day (BID) | ORAL | 0 refills | Status: DC
Start: 2022-10-15 — End: 2022-11-12

## 2022-10-15 NOTE — Progress Notes (Signed)
Meds ordered this encounter  Medications   oxyCODONE ER (XTAMPZA ER) 27 MG C12A    Sig: Take 1 capsule by mouth in the morning and at bedtime.    Dispense:  60 capsule    Refill:  0

## 2022-11-12 ENCOUNTER — Other Ambulatory Visit: Payer: Self-pay | Admitting: Family Medicine

## 2022-11-12 DIAGNOSIS — G8929 Other chronic pain: Secondary | ICD-10-CM

## 2022-11-12 MED ORDER — XTAMPZA ER 27 MG PO C12A
1.0000 | EXTENDED_RELEASE_CAPSULE | Freq: Two times a day (BID) | ORAL | 0 refills | Status: DC
Start: 2022-11-12 — End: 2022-12-07

## 2022-11-15 ENCOUNTER — Telehealth: Payer: Self-pay | Admitting: *Deleted

## 2022-11-15 NOTE — Telephone Encounter (Signed)
Form completed,faxed,confirmation received and scanned into patient's chart.

## 2022-11-21 LAB — HEMOGLOBIN A1C: Hemoglobin A1C: 7.1

## 2022-12-07 ENCOUNTER — Ambulatory Visit (INDEPENDENT_AMBULATORY_CARE_PROVIDER_SITE_OTHER): Payer: Medicare Other | Admitting: Family Medicine

## 2022-12-07 ENCOUNTER — Encounter: Payer: Self-pay | Admitting: Family Medicine

## 2022-12-07 ENCOUNTER — Telehealth: Payer: Self-pay | Admitting: Family Medicine

## 2022-12-07 VITALS — BP 119/66 | HR 81 | Ht 64.0 in | Wt 143.0 lb

## 2022-12-07 DIAGNOSIS — G8929 Other chronic pain: Secondary | ICD-10-CM

## 2022-12-07 DIAGNOSIS — N184 Chronic kidney disease, stage 4 (severe): Secondary | ICD-10-CM

## 2022-12-07 DIAGNOSIS — Z794 Long term (current) use of insulin: Secondary | ICD-10-CM

## 2022-12-07 DIAGNOSIS — E1122 Type 2 diabetes mellitus with diabetic chronic kidney disease: Secondary | ICD-10-CM

## 2022-12-07 DIAGNOSIS — Z23 Encounter for immunization: Secondary | ICD-10-CM

## 2022-12-07 DIAGNOSIS — I10 Essential (primary) hypertension: Secondary | ICD-10-CM | POA: Diagnosis not present

## 2022-12-07 MED ORDER — OXYCODONE HCL 10 MG PO TABS: 10.0000 mg | ORAL_TABLET | Freq: Four times a day (QID) | ORAL | 0 refills | Status: DC

## 2022-12-07 MED ORDER — XTAMPZA ER 27 MG PO C12A
1.0000 | EXTENDED_RELEASE_CAPSULE | Freq: Two times a day (BID) | ORAL | 0 refills | Status: DC
Start: 2023-01-10 — End: 2022-12-14

## 2022-12-07 MED ORDER — ALLOPURINOL 300 MG PO TABS: 300.0000 mg | ORAL_TABLET | Freq: Every day | ORAL | 3 refills | Status: AC

## 2022-12-07 MED ORDER — XTAMPZA ER 27 MG PO C12A: 1.0000 | EXTENDED_RELEASE_CAPSULE | Freq: Two times a day (BID) | ORAL | 0 refills | Status: DC

## 2022-12-07 MED ORDER — OXYCODONE HCL 10 MG PO TABS
10.0000 mg | ORAL_TABLET | Freq: Four times a day (QID) | ORAL | 0 refills | Status: DC
Start: 2023-02-08 — End: 2022-12-10

## 2022-12-07 MED ORDER — XTAMPZA ER 27 MG PO C12A
1.0000 | EXTENDED_RELEASE_CAPSULE | Freq: Two times a day (BID) | ORAL | 0 refills | Status: DC
Start: 2023-02-08 — End: 2022-12-14

## 2022-12-07 NOTE — Progress Notes (Signed)
Established Patient Office Visit  Subjective   Patient ID: Barbara Yu, female    DOB: 1945/07/06  Age: 77 y.o. MRN: [id number]  Chief Complaint  Patient presents with   Pain Management    HPI  DM- up to mounjaro 5mg  weekly. She usually does her shot every 7-10 days.  Causes very dec appetite. A1C was 7.1 with Endocrinology.   Hypertension- Pt denies chest pain, SOB, dizziness, or heart palpitations.  Taking meds as directed w/o problems.  Denies medication side effects.    Gout in her toes has been flaring recently she recently started drinking some tart cherry juice and feels like that actually has been helpful.  Follow-up chronic pain management.  We recently increased the Xtampza ER to 27 mg she says its actually been really helpful for her pain control.  She has not noticed any excess sedation falling asleep or napping or any other mental status changes.  She says it has been a great change.  She still uses short acting oxycodone as needed on top of that.    ROS    Objective:     BP 119/66   Pulse 81   Ht 5\' 4"  (1.626 m)   Wt 143 lb (64.9 kg)   SpO2 98%   BMI 24.55 kg/m    Physical Exam Vitals and nursing note reviewed.  Constitutional:      Appearance: She is well-developed.  HENT:     Head: Normocephalic and atraumatic.  Cardiovascular:     Rate and Rhythm: Normal rate and regular rhythm.     Heart sounds: Normal heart sounds.  Pulmonary:     Effort: Pulmonary effort is normal.     Breath sounds: Normal breath sounds.  Skin:    General: Skin is warm and dry.  Neurological:     Mental Status: She is alert and oriented to person, place, and time.  Psychiatric:        Behavior: Behavior normal.      Results for orders placed or performed in visit on 12/07/22  Hemoglobin A1c  Result Value Ref Range   Hemoglobin A1C 7.1       The 10-year ASCVD risk score (Arnett DK, et al., 2019) is: 35.5%    Assessment & Plan:   Problem List Items  Addressed This Visit       Cardiovascular and Mediastinum   HTN (hypertension)    Well controlled. Continue current regimen. Follow up in  20mo       Relevant Orders   CMP14+EGFR   Lipid panel     Endocrine   Type 2 diabetes mellitus with stage 4 chronic kidney disease (HCC)    While she is actually doing well with her A1c but unfortunately gets pretty significant appetite suppression with all the GLP-1's.  They will likely keep her at 5 mg and just encouraged her to make sure that she is eating at least a little even if it is just a few bites and to keep up her protein intake.      Relevant Medications   MOUNJARO 5 MG/0.5ML Pen   Other Relevant Orders   CMP14+EGFR   Lipid panel   Urine Microalbumin w/creat. ratio     Genitourinary   CKD (chronic kidney disease) stage 4, GFR 15-29 ml/min (HCC)    Follow-up with nephrology in December.      Relevant Orders   CMP14+EGFR   Lipid panel   Urine Microalbumin w/creat. ratio  Other   Encounter for chronic pain management - Primary    Discussed inc the Ztampa to 27mg     Indication for chronic opioid: Failed back surgical syndrome Medication and dose: Xtampza ER 27 mg BID.  Oxycodone 10 mg 4 times daily # pills per month: 60, 120 Last UDS date: 12/07/22 Opioid Treatment Agreement signed (Y/N): y Opioid Treatment Agreement last reviewed with patient:  06/14/22 NCCSRS reviewed this encounter (include red flags):  Yes      Relevant Medications   oxyCODONE ER (XTAMPZA ER) 27 MG C12A (Start on 12/11/2022)   Oxycodone HCl 10 MG TABS (Start on 12/11/2022)   Oxycodone HCl 10 MG TABS (Start on 01/10/2023)   Oxycodone HCl 10 MG TABS (Start on 02/08/2023)   oxyCODONE ER (XTAMPZA ER) 27 MG C12A (Start on 01/10/2023)   oxyCODONE ER (XTAMPZA ER) 27 MG C12A (Start on 02/08/2023)   Other Relevant Orders   Drug Screen, Ur (12+Oxycodone+Crt)    Return in about 3 months (around 03/09/2023) for Pain managmeent .    Nani Gasser,  MD

## 2022-12-07 NOTE — Assessment & Plan Note (Signed)
While she is actually doing well with her A1c but unfortunately gets pretty significant appetite suppression with all the GLP-1's.  They will likely keep her at 5 mg and just encouraged her to make sure that she is eating at least a little even if it is just a few bites and to keep up her protein intake.

## 2022-12-07 NOTE — Assessment & Plan Note (Addendum)
Discussed inc the Ztampa to 27mg     Indication for chronic opioid: Failed back surgical syndrome Medication and dose: Xtampza ER 27 mg BID.  Oxycodone 10 mg 4 times daily # pills per month: 60, 120 Last UDS date: 12/07/22 Opioid Treatment Agreement signed (Y/N): y Opioid Treatment Agreement last reviewed with patient:  06/14/22 NCCSRS reviewed this encounter (include red flags):  Yes

## 2022-12-07 NOTE — Telephone Encounter (Signed)
Pharmacy called from [phone] ref # [id number] please contact about orders on all oxycodone orders

## 2022-12-07 NOTE — Assessment & Plan Note (Signed)
Follow-up with nephrology in December.

## 2022-12-07 NOTE — Assessment & Plan Note (Signed)
Well controlled. Continue current regimen. Follow up in  6 mo  

## 2022-12-08 LAB — CMP14+EGFR
ALT: 41 IU/L — ABNORMAL HIGH (ref 0–32)
AST: 80 IU/L — ABNORMAL HIGH (ref 0–40)
Albumin: 4.7 g/dL (ref 3.8–4.8)
Alkaline Phosphatase: 86 IU/L (ref 44–121)
BUN/Creatinine Ratio: 11 — ABNORMAL LOW (ref 12–28)
BUN: 16 mg/dL (ref 8–27)
Bilirubin Total: 0.3 mg/dL (ref 0.0–1.2)
CO2: 22 mmol/L (ref 20–29)
Calcium: 9.8 mg/dL (ref 8.7–10.3)
Chloride: 101 mmol/L (ref 96–106)
Creatinine, Ser: 1.43 mg/dL — ABNORMAL HIGH (ref 0.57–1.00)
Globulin, Total: 2.5 g/dL (ref 1.5–4.5)
Glucose: 109 mg/dL — ABNORMAL HIGH (ref 70–99)
Potassium: 5 mmol/L (ref 3.5–5.2)
Sodium: 139 mmol/L (ref 134–144)
Total Protein: 7.2 g/dL (ref 6.0–8.5)
eGFR: 38 mL/min/{1.73_m2} — ABNORMAL LOW (ref >59)

## 2022-12-08 LAB — LIPID PANEL
Chol/HDL Ratio: 2.9 ratio (ref 0.0–4.4)
Cholesterol, Total: 133 mg/dL (ref 100–199)
HDL: 46 mg/dL (ref >39)
LDL Chol Calc (NIH): 52 mg/dL (ref 0–99)
Triglycerides: 218 mg/dL — ABNORMAL HIGH (ref 0–149)
VLDL Cholesterol Cal: 35 mg/dL (ref 5–40)

## 2022-12-09 LAB — MICROALBUMIN / CREATININE URINE RATIO
Creatinine, Urine: 48.9 mg/dL
Microalb/Creat Ratio: 126 mg/g{creat} — ABNORMAL HIGH (ref 0–29)
Microalbumin, Urine: 61.7 ug/mL

## 2022-12-10 ENCOUNTER — Telehealth: Payer: Self-pay | Admitting: Family Medicine

## 2022-12-10 DIAGNOSIS — G8929 Other chronic pain: Secondary | ICD-10-CM

## 2022-12-10 MED ORDER — OXYCODONE HCL 10 MG PO TABS: 10.0000 mg | ORAL_TABLET | Freq: Four times a day (QID) | ORAL | 0 refills | Status: DC

## 2022-12-10 MED ORDER — OXYCODONE HCL 10 MG PO TABS
10.0000 mg | ORAL_TABLET | Freq: Four times a day (QID) | ORAL | 0 refills | Status: DC
Start: 2023-02-08 — End: 2023-03-12

## 2022-12-10 NOTE — Telephone Encounter (Signed)
Meds ordered this encounter  Medications   Oxycodone HCl 10 MG TABS    Sig: Take 1 tablet (10 mg total) by mouth in the morning, at noon, in the evening, and at bedtime.    Dispense:  120 tablet    Refill:  0   Oxycodone HCl 10 MG TABS    Sig: Take 1 tablet (10 mg total) by mouth in the morning, at noon, in the evening, and at bedtime.    Dispense:  120 tablet    Refill:  0   Oxycodone HCl 10 MG TABS    Sig: Take 1 tablet (10 mg total) by mouth in the morning, at noon, in the evening, and at bedtime.    Dispense:  120 tablet    Refill:  0

## 2022-12-10 NOTE — Telephone Encounter (Signed)
Spoke w/pharmacist  [employer] with [facility] and advised that pt's prescriptions should be sent to her local pharmacy not to her mail order. He stated that he would cancel these. PCP made aware and has sent these to her local pharmacy.

## 2022-12-10 NOTE — Progress Notes (Signed)
Hi Barbara Yu, kidney functions at 1.4 which actually looks a little bit better than it did 6 months ago.Your liver enzymes are up a little do want to keep an eye on this and plan to recheck again in 4 weeks.  LDL cholesterol looks great it is at goal.

## 2022-12-12 LAB — DRUG SCREEN, UR (12+OXYCODONE+CRT)
Amphetamine Scrn, Ur: NEGATIVE ng/mL
BARBITURATE SCREEN URINE: NEGATIVE ng/mL
BENZODIAZEPINE SCREEN, URINE: NEGATIVE ng/mL
CANNABINOIDS UR QL SCN: NEGATIVE ng/mL
Cocaine (Metab) Scrn, Ur: NEGATIVE ng/mL
Creatinine(Crt), U: 52.1 mg/dL (ref 20.0–300.0)
Fentanyl, Urine: NEGATIVE pg/mL
Meperidine Screen, Urine: NEGATIVE ng/mL
Methadone Screen, Urine: NEGATIVE ng/mL
OXYCODONE+OXYMORPHONE UR QL SCN: POSITIVE — AB
Opiate Scrn, Ur: NEGATIVE ng/mL
Ph of Urine: 5.2 (ref 4.5–8.9)
Phencyclidine Qn, Ur: NEGATIVE ng/mL
Propoxyphene Scrn, Ur: NEGATIVE ng/mL
SPECIFIC GRAVITY: 1.006
Tramadol Screen, Urine: NEGATIVE ng/mL

## 2022-12-14 MED ORDER — XTAMPZA ER 27 MG PO C12A
1.0000 | EXTENDED_RELEASE_CAPSULE | Freq: Two times a day (BID) | ORAL | 0 refills | Status: DC
Start: 2023-01-10 — End: 2023-03-12

## 2022-12-14 MED ORDER — XTAMPZA ER 27 MG PO C12A: 1.0000 | EXTENDED_RELEASE_CAPSULE | Freq: Two times a day (BID) | ORAL | 0 refills | Status: DC

## 2022-12-14 NOTE — Addendum Note (Signed)
Addended by: Nani Gasser D on: 12/14/2022 05:31 PM   Modules accepted: Orders

## 2022-12-14 NOTE — Telephone Encounter (Signed)
Meds ordered this encounter  Medications   Oxycodone HCl 10 MG TABS    Sig: Take 1 tablet (10 mg total) by mouth in the morning, at noon, in the evening, and at bedtime.    Dispense:  120 tablet    Refill:  0   Oxycodone HCl 10 MG TABS    Sig: Take 1 tablet (10 mg total) by mouth in the morning, at noon, in the evening, and at bedtime.    Dispense:  120 tablet    Refill:  0   Oxycodone HCl 10 MG TABS    Sig: Take 1 tablet (10 mg total) by mouth in the morning, at noon, in the evening, and at bedtime.    Dispense:  120 tablet    Refill:  0   oxyCODONE ER (XTAMPZA ER) 27 MG C12A    Sig: Take 1 capsule by mouth in the morning and at bedtime.    Dispense:  60 capsule    Refill:  0   oxyCODONE ER (XTAMPZA ER) 27 MG C12A    Sig: Take 1 capsule by mouth in the morning and at bedtime.    Dispense:  60 capsule    Refill:  0   oxyCODONE ER (XTAMPZA ER) 27 MG C12A    Sig: Take 1 capsule by mouth in the morning and at bedtime.    Dispense:  60 capsule    Refill:  0

## 2022-12-14 NOTE — Telephone Encounter (Signed)
Patient is at pharmacy picking up pain medication and stated that the Xtampa 27 mg is not there. Will fwd to pcp for refill.

## 2022-12-14 NOTE — Addendum Note (Signed)
Addended by: Deno Etienne on: 12/14/2022 04:21 PM   Modules accepted: Orders

## 2023-01-01 ENCOUNTER — Telehealth: Payer: Self-pay

## 2023-01-01 NOTE — Telephone Encounter (Signed)
Patients son Barbara Yu) came into office to drop off FMLA Forms to be able to provide care for patient, forms placed in Dr. Shelah Lewandowsky box, thanks.

## 2023-01-02 ENCOUNTER — Encounter: Payer: Self-pay | Admitting: Physician Assistant

## 2023-01-02 ENCOUNTER — Telehealth (INDEPENDENT_AMBULATORY_CARE_PROVIDER_SITE_OTHER): Payer: Medicare Other | Admitting: Physician Assistant

## 2023-01-02 VITALS — Temp 98.0°F

## 2023-01-02 DIAGNOSIS — N184 Chronic kidney disease, stage 4 (severe): Secondary | ICD-10-CM | POA: Diagnosis not present

## 2023-01-02 DIAGNOSIS — U071 COVID-19: Secondary | ICD-10-CM

## 2023-01-02 MED ORDER — MOLNUPIRAVIR EUA 200MG CAPSULE: 4.0000 | ORAL_CAPSULE | Freq: Two times a day (BID) | ORAL | 0 refills | Status: AC

## 2023-01-02 NOTE — Progress Notes (Signed)
Pt took home covid test this morning. She states that on Sunday she had a runny nose. By yesterday she was experiencing chills and a low grade temp of 99.0.   She has been coughing up greenish mucus.

## 2023-01-02 NOTE — Progress Notes (Signed)
..Virtual Visit via Video Note  I connected with Barbara Yu on 01/02/23 at  2:20 PM EDT by a video enabled telemedicine application and verified that I am speaking with the correct person using two identifiers.  Location: Patient: home Provider: clinic  .Marland KitchenParticipating in visit:  Patient: Barbara Yu  Provider: Tandy Gaw PA-C Provider in training: Weston Anna PA-S   I discussed the limitations of evaluation and management by telemedicine and the availability of in person appointments. The patient expressed understanding and agreed to proceed.  History of Present Illness: Pt is a 77 yo female who has had URI symptoms since Sunday evening. She tested this morning for covid and was positive. She has not had updated vaccine. She has had lots of sinus pressure, headache, dry cough, fatigue, body aches. She denies any problems breathing or with leg pain and swelling. She denies any CP, fever, chills, SOB. She is taking tussin OTC.   No previous lung issues.   .. 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   Primary osteoarthritis of both hips 01/03/2012   Gastro-esophageal reflux disease without esophagitis 06/03/2012   Hypothyroidism 06/03/2012   Osteoarthritis 10/02/2016   Peripheral neuropathy 10/02/2016   Iron deficiency anemia 10/02/2016   AR (allergic rhinitis) 10/02/2016   Gout 10/03/2016   CKD (chronic kidney disease) stage 4, GFR 15-29 ml/min (HCC) 10/05/2016   Vitamin D deficiency 06/24/2014   Memory deficit 06/27/2018   Hyperkalemia, diminished renal excretion 01/05/2019   Precordial chest pain 07/14/2019   Nonspecific abnormal electrocardiogram (ECG) (EKG) 07/14/2019   Dyslipidemia 07/14/2019   Lumbar pseudoarthrosis 06/28/2016   Pain in right hip 01/03/2012   Thrombocytopenia, unspecified (HCC) 02/12/2013   Anemia in stage 4 chronic kidney disease (HCC) 02/03/2018   Type 2 diabetes mellitus with stage 4  chronic kidney disease (HCC) 06/03/2012   Anemia    Angina pectoris (HCC) 01/11/2020   Uncontrolled type 2 diabetes with renal manifestation 10/16/2019   HTN (hypertension) 06/03/2012   Hypercalcemia 03/08/2020   Pelvic mass in female 06/24/2020   Vaginal atrophy 08/29/2021   Encounter for chronic pain management 06/14/2022   Paresthesia of both legs 06/12/2012   Postoperative anemia due to acute blood loss 06/12/2012   S/P lumbar fusion 10/16/2012   Weakness 06/12/2012   Resolved Ambulatory Problems    Diagnosis Date Noted   DDD (degenerative disc disease), lumbosacral 10/16/2012   HTN (hypertension) 06/03/2012   Controlled diabetes mellitus type 2 with complications (HCC) 06/03/2012   Other intervertebral disc degeneration, lumbosacral region 10/16/2012   Lumbosacral radiculopathy at L3 04/13/2016   Lumbar spondylosis 06/12/2012   Bronchitis, acute 05/21/2016   Anemia in stage 3 chronic kidney disease (HCC) 02/03/2018   Uncontrolled type 2 diabetes with renal manifestation 10/16/2019   Diabetes mellitus without complication (HCC)    Hypertension    Diabetes mellitus without complication (HCC)    Anemia in stage 3 chronic kidney disease (HCC) 02/03/2018   Preop cardiovascular exam 06/30/2020   COVID-19 virus infection 04/11/2022   Past Medical History:  Diagnosis Date   Normal cardiac stress test 06/09/2012       Observations/Objective: No acute distress Normal breathing Normal mood and appearance .Marland Kitchen Today's Vitals   01/02/23 1311  Temp: 98 F (36.7 C)      Assessment and Plan: Marland KitchenMarland KitchenSukanya was seen today for covid positive.  Diagnoses and all orders for this visit:  COVID-19 virus infection -     molnupiravir EUA (  LAGEVRIO) 200 mg CAPS capsule; Take 4 capsules (800 mg total) by mouth 2 (two) times daily for 5 days.  CKD (chronic kidney disease) stage 4, GFR 15-29 ml/min (HCC)   Day 3 of covid infection Discussed quarantine for 5 days Start molnupivir  due to paxlovid interactions with oxycodone and pravahol and colchine.  GFR is 38 Discussed red flags symptoms of covid infections and if symptoms changing or worsening to follow up in office/UC/ED.     Follow Up Instructions:    I discussed the assessment and treatment plan with the patient. The patient was provided an opportunity to ask questions and all were answered. The patient agreed with the plan and demonstrated an understanding of the instructions.   The patient was advised to call back or seek an in-person evaluation if the symptoms worsen or if the condition fails to improve as anticipated.   Tandy Gaw, PA-C

## 2023-01-04 NOTE — Telephone Encounter (Signed)
Forms completed for son Caryn Bee to pick up placed in time to be basket.  There are a couple of portions that he needs to complete.

## 2023-01-07 NOTE — Telephone Encounter (Signed)
Pt given forms today([place]) at his OV with pcp.

## 2023-01-15 ENCOUNTER — Other Ambulatory Visit: Payer: Self-pay | Admitting: Physician Assistant

## 2023-01-15 DIAGNOSIS — Z780 Asymptomatic menopausal state: Secondary | ICD-10-CM

## 2023-01-15 DIAGNOSIS — Z0000 Encounter for general adult medical examination without abnormal findings: Secondary | ICD-10-CM

## 2023-01-21 ENCOUNTER — Other Ambulatory Visit: Payer: Self-pay | Admitting: Family Medicine

## 2023-01-21 DIAGNOSIS — J309 Allergic rhinitis, unspecified: Secondary | ICD-10-CM

## 2023-01-22 ENCOUNTER — Telehealth: Payer: Self-pay | Admitting: General Practice

## 2023-01-22 NOTE — Telephone Encounter (Signed)
Patient was scheduled for AWV today at 4 pm. Attempted to call patient and left messages x 3. Appt cancelled to avoid no show fee.

## 2023-01-23 ENCOUNTER — Other Ambulatory Visit: Payer: Self-pay | Admitting: Cardiology

## 2023-02-25 ENCOUNTER — Ambulatory Visit: Payer: Medicare Other | Admitting: Family Medicine

## 2023-02-25 DIAGNOSIS — Z0000 Encounter for general adult medical examination without abnormal findings: Secondary | ICD-10-CM | POA: Diagnosis not present

## 2023-02-25 NOTE — Patient Instructions (Addendum)
MEDICARE ANNUAL WELLNESS VISIT Health Maintenance Summary and Written Plan of Care  [name] ,  Thank you for allowing me to perform your Medicare Annual Wellness Visit and for your ongoing commitment to your health.   Health Maintenance & Immunization History Health Maintenance  Topic Date Due   FOOT EXAM  05/23/2023   HEMOGLOBIN A1C  05/24/2023   OPHTHALMOLOGY EXAM  08/01/2023   Diabetic kidney evaluation - eGFR measurement  12/07/2023   Diabetic kidney evaluation - Urine ACR  12/07/2023   Medicare Annual Wellness (AWV)  02/25/2024   DTaP/Tdap/Td (2 - Td or Tdap) 04/30/2027   DEXA SCAN  02/20/2028   Pneumonia Vaccine 71+ Years old  Completed   INFLUENZA VACCINE  Completed   COVID-19 Vaccine  Completed   Hepatitis C Screening  Completed   Zoster Vaccines- Shingrix  Completed   HPV VACCINES  Aged Out   Colonoscopy  Discontinued   Immunization History  Administered Date(s) Administered   Fluad Quad(high Dose 65+) 02/03/2018, 01/31/2022   Fluad Trivalent(High Dose 65+) 12/07/2022   Influenza Split 03/02/2014   Influenza, High Dose Seasonal PF 01/18/2015, 01/05/2016, 01/09/2019, 01/18/2020   Influenza,inj,Quad PF,6+ Mos 02/03/2018   Influenza,trivalent, recombinat, inj, PF 02/05/2012, 01/21/2013   Influenza-Unspecified 02/21/2013, 01/29/2017, 02/03/2018   Moderna Covid-19 Vaccine Bivalent Booster 11yrs & up 03/21/2021   Moderna Sars-Covid-2 Vaccination 05/25/2019, 06/22/2019, 02/04/2020, 12/14/2020   PNEUMOCOCCAL CONJUGATE-20 03/06/2022   Pfizer(Comirnaty)Fall Seasonal Vaccine 12 years and older 01/08/2022, 02/11/2023   Pneumococcal Conjugate-13 05/05/2013   Pneumococcal Polysaccharide-23 09/15/2009   Tdap 04/29/2017   Zoster Recombinant(Shingrix) 03/05/2019, 07/21/2019   Zoster, Live 04/23/2006    These are the patient goals that we discussed:  Goals Addressed               This Visit's Progress     Patient Stated (pt-stated)        Patient stated that she  would like to get out more.          This is a list of Health Maintenance Items that are overdue or due now: There are no preventive care reminders to display for this patient.    Orders/Referrals Placed Today: No orders of the defined types were placed in this encounter.  (Contact our referral department at [phone] if you have not spoken with someone about your referral appointment within the next 5 days)    Follow-up Plan Follow-up with Agapito Games, MD as planned Medicare wellness visit in one year.  Patient will access AVS on my chart.      Health Maintenance, Female Adopting a healthy lifestyle and getting preventive care are important in promoting health and wellness. Ask your health care provider about: The right schedule for you to have regular tests and exams. Things you can do on your own to prevent diseases and keep yourself healthy. What should I know about diet, weight, and exercise? Eat a healthy diet  Eat a diet that includes plenty of vegetables, fruits, low-fat dairy products, and lean protein. Do not eat a lot of foods that are high in solid fats, added sugars, or sodium. Maintain a healthy weight Body mass index (BMI) is used to identify weight problems. It estimates body fat based on height and weight. Your health care provider can help determine your BMI and help you achieve or maintain a healthy weight. Get regular exercise Get regular exercise. This is one of the most important things you can do for your health. Most adults should: Exercise  for at least 150 minutes each week. The exercise should increase your heart rate and make you sweat (moderate-intensity exercise). Do strengthening exercises at least twice a week. This is in addition to the moderate-intensity exercise. Spend less time sitting. Even light physical activity can be beneficial. Watch cholesterol and blood lipids Have your blood tested for lipids and cholesterol at 79 years  of age, then have this test every 5 years. Have your cholesterol levels checked more often if: Your lipid or cholesterol levels are high. You are older than 79 years of age. You are at high risk for heart disease. What should I know about cancer screening? Depending on your health history and family history, you may need to have cancer screening at various ages. This may include screening for: Breast cancer. Cervical cancer. Colorectal cancer. Skin cancer. Lung cancer. What should I know about heart disease, diabetes, and high blood pressure? Blood pressure and heart disease High blood pressure causes heart disease and increases the risk of stroke. This is more likely to develop in people who have high blood pressure readings or are overweight. Have your blood pressure checked: Every 3-5 years if you are 79-17 years of age. Every year if you are 79 years old or older. Diabetes Have regular diabetes screenings. This checks your fasting blood sugar level. Have the screening done: Once every three years after age 79 if you are at a normal weight and have a low risk for diabetes. More often and at a younger age if you are overweight or have a high risk for diabetes. What should I know about preventing infection? Hepatitis B If you have a higher risk for hepatitis B, you should be screened for this virus. Talk with your health care provider to find out if you are at risk for hepatitis B infection. Hepatitis C Testing is recommended for: Everyone born from [birth year] through 1967. Anyone with known risk factors for hepatitis C. Sexually transmitted infections (STIs) Get screened for STIs, including gonorrhea and chlamydia, if: You are sexually active and are younger than 79 years of age. You are older than 79 years of age and your health care provider tells you that you are at risk for this type of infection. Your sexual activity has changed since you were last screened, and you are at increased  risk for chlamydia or gonorrhea. Ask your health care provider if you are at risk. Ask your health care provider about whether you are at high risk for HIV. Your health care provider may recommend a prescription medicine to help prevent HIV infection. If you choose to take medicine to prevent HIV, you should first get tested for HIV. You should then be tested every 3 months for as long as you are taking the medicine. Pregnancy If you are about to stop having your period (premenopausal) and you may become pregnant, seek counseling before you get pregnant. Take 400 to 800 micrograms (mcg) of folic acid every day if you become pregnant. Ask for birth control (contraception) if you want to prevent pregnancy. Osteoporosis and menopause Osteoporosis is a disease in which the bones lose minerals and strength with aging. This can result in bone fractures. If you are 44 years old or older, or if you are at risk for osteoporosis and fractures, ask your health care provider if you should: Be screened for bone loss. Take a calcium or vitamin D supplement to lower your risk of fractures. Be given hormone replacement therapy (HRT) to treat symptoms of  menopause. Follow these instructions at home: Alcohol use Do not drink alcohol if: Your health care provider tells you not to drink. You are pregnant, may be pregnant, or are planning to become pregnant. If you drink alcohol: Limit how much you have to: 0-1 drink a day. Know how much alcohol is in your drink. In the [place] one drink equals one 12 oz bottle of beer (355 mL), one 5 oz glass of wine (148 mL), or one 1 oz glass of hard liquor (44 mL). Lifestyle Do not use any products that contain nicotine or tobacco. These products include cigarettes, chewing tobacco, and vaping devices, such as e-cigarettes. If you need help quitting, ask your health care provider. Do not use street drugs. Do not share needles. Ask your health care provider for help if you need  support or information about quitting drugs. General instructions Schedule regular health, dental, and eye exams. Stay current with your vaccines. Tell your health care provider if: You often feel depressed. You have ever been abused or do not feel safe at home. Summary Adopting a healthy lifestyle and getting preventive care are important in promoting health and wellness. Follow your health care provider's instructions about healthy diet, exercising, and getting tested or screened for diseases. Follow your health care provider's instructions on monitoring your cholesterol and blood pressure. This information is not intended to replace advice given to you by your health care provider. Make sure you discuss any questions you have with your health care provider. Document Revised: 08/29/2020 Document Reviewed: 08/29/2020 Elsevier Patient Education  2024 [employer].

## 2023-02-25 NOTE — Progress Notes (Signed)
MEDICARE ANNUAL WELLNESS VISIT  02/25/2023  Telephone Visit Disclaimer This Medicare AWV was conducted by telephone due to national recommendations for restrictions regarding the COVID-19 Pandemic (e.g. social distancing).  I verified, using two identifiers, that I am speaking with Barbara Yu or their authorized healthcare agent. I discussed the limitations, risks, security, and privacy concerns of performing an evaluation and management service by telephone and the potential availability of an in-person appointment in the future. The patient expressed understanding and agreed to proceed.  Location of Patient: Home Location of Provider (nurse):  In the office.   Subjective:    Barbara Yu is a 77 y.o. female patient of Metheney, Barbarann Ehlers, MD who had a Medicare Annual Wellness Visit today via telephone. Barbara Yu is Retired and lives alone. she has 5 children. she reports that she is socially active and does interact with friends/family regularly. she is moderately physically active and enjoys watching television and going to church.  Patient Care Team: Agapito Games, MD as PCP - General (Family Medicine) Georgeanna Lea, MD as PCP - Cardiology (Cardiology) Rowe Pavy, MD as Referring Physician (Nephrology) Kelvin Cellar, MD as Referring Physician (Obstetrics and Gynecology) Sherlynn Stalls DPM as Referring Physician (Podiatry) Samella Parr, MD as Referring Physician (Nephrology)     02/25/2023    2:33 PM 01/19/2022    1:30 PM 10/18/2020    2:06 PM 10/03/2016   10:46 AM  Advanced Directives  Does Patient Have a Medical Advance Directive? Yes Yes No   Type of Advance Directive Living will Living will    Does patient want to make changes to medical advance directive? No - Patient declined No - Patient declined    Would patient like information on creating a medical advance directive?   No - Patient declined Yes (MAU/Ambulatory/Procedural Areas -  Information given)    Hospital Utilization Over the Past 12 Months: # of hospitalizations or ER visits: 1 # of surgeries: 0  Review of Systems    Patient reports that her overall health is unchanged compared to last year.  History obtained from chart review and the patient  Patient Reported Readings (BP, Pulse, CBG, Weight, etc) none Per patient no change in vitals since last visit, unable to obtain new vitals due to telehealth visit  Pain Assessment Pain : No/denies pain     Current Medications & Allergies (verified) Allergies as of 02/25/2023       Reactions   Amoxicillin Diarrhea, Itching   Other reaction(s): Confusion (intolerance)   Atorvastatin Other (See Comments)   Burning skin sensation   Hydrochlorothiazide    Other reaction(s): Other Chronic renal failure   Nsaids Other (See Comments)   Peptic ulcer disease   Tramadol Nausea And Vomiting   Other reaction(s): GI Upset (intolerance)   Allopurinol Other (See Comments)   Makes her stomach hurt   Morphine Other (See Comments)   Hallucinations   Omeprazole Diarrhea   Pepcid [famotidine] Other (See Comments)   Cause her to become gasy   Trulicity [dulaglutide] Other (See Comments)   Ciprofloxacin    Other reaction(s): GI Upset (intolerance) Other reaction(s): Abdominal Pain Other reaction(s): Abdominal Pain   Gabapentin Nausea Only   Feels "stupid" and wt gain    Pantoprazole Diarrhea        Medication List        Accurate as of February 25, 2023  2:46 PM. If you have any questions, ask your nurse or doctor.  acetaminophen 325 MG tablet Commonly known as: TYLENOL Take 650 mg by mouth as needed for mild pain or moderate pain.   albuterol 108 (90 Base) MCG/ACT inhaler Commonly known as: ProAir HFA Inhale 2 puffs into the lungs every 6 (six) hours as needed for wheezing or shortness of breath. USE 2 INHALATIONS EVERY 6 HOURS AS NEEDED   allopurinol 300 MG tablet Commonly known as:  ZYLOPRIM Take 1 tablet (300 mg total) by mouth daily.   amLODipine 5 MG tablet Commonly known as: NORVASC Take 1 tablet (5 mg total) by mouth daily.   clonazePAM 1 MG tablet Commonly known as: KLONOPIN Take 1 mg by mouth at bedtime as needed for anxiety.   colchicine 0.6 MG tablet Take 1 tablet (0.6 mg total) by mouth daily as needed. What changed: reasons to take this   diclofenac Sodium 1 % Gel Commonly known as: VOLTAREN Apply 4 g topically 4 (four) times daily.   estradiol 0.1 MG/GM vaginal cream Commonly known as: ESTRACE Place 1 Applicatorful vaginally at bedtime. X 2 weeks and then twice a week.   FREESTYLE LITE test strip Generic drug: glucose blood For testing up to 4 times daily. DX: E11.9   isosorbide mononitrate 120 MG 24 hr tablet Commonly known as: IMDUR Take 1 tablet (120 mg total) by mouth daily. Patient needs appointment for further refills. 1 st attempt   lansoprazole 15 MG capsule Commonly known as: PREVACID Take 1 capsule (15 mg total) by mouth daily at 12 noon.   Lantus SoloStar 100 UNIT/ML Solostar Pen Generic drug: insulin glargine Inject 15-25 Units into the skin daily.   levothyroxine 75 MCG tablet Commonly known as: SYNTHROID Take 1 tablet (75 mcg total) by mouth daily. What changed: Another medication with the same name was changed. Make sure you understand how and when to take each.   Synthroid 50 MCG tablet Generic drug: levothyroxine TAKE 1 TABLET TWO TIMES A WEEK. ON SUNDAY AND WEDNESDAY. ALTERNATING WITH 75 MCG THE OTHER 5 DAYS What changed: See the new instructions.   metoprolol succinate 50 MG 24 hr tablet Commonly known as: Toprol XL Take 1.5 tablets (75 mg total) by mouth daily. Take with or immediately following a meal.   montelukast 10 MG tablet Commonly known as: SINGULAIR TAKE 1 TABLET AT BEDTIME   Mounjaro 5 MG/0.5ML Pen Generic drug: tirzepatide Inject 5 mg into the skin once a week.   Narcan 4 MG/0.1ML Liqd nasal  spray kit Generic drug: naloxone Place 1 spray into the nose once.   nitroGLYCERIN 0.4 MG SL tablet Commonly known as: NITROSTAT Place 1 tablet (0.4 mg total) under the tongue every 5 (five) minutes as needed for chest pain.   Oxycodone HCl 10 MG Tabs Take 1 tablet (10 mg total) by mouth in the morning, at noon, in the evening, and at bedtime.   Oxycodone HCl 10 MG Tabs Take 1 tablet (10 mg total) by mouth in the morning, at noon, in the evening, and at bedtime.   Oxycodone HCl 10 MG Tabs Take 1 tablet (10 mg total) by mouth in the morning, at noon, in the evening, and at bedtime.   pravastatin 10 MG tablet Commonly known as: PRAVACHOL Take 10 mg by mouth at bedtime.   Sure Comfort Pen Needles 31G X 8 MM Misc Generic drug: Insulin Pen Needle USE WITH INSULIN THREE TIMES A DAY   tolterodine 4 MG 24 hr capsule Commonly known as: Detrol LA Take 1 capsule (4 mg total)  by mouth daily.   triamcinolone 0.1 % cream : eucerin Crea Apply 1 Application topically 2 (two) times daily as needed for itching. 1 jar. Mix 1:1   Xtampza ER 27 MG C12a Generic drug: oxyCODONE ER Take 1 capsule by mouth in the morning and at bedtime.   Xtampza ER 27 MG C12a Generic drug: oxyCODONE ER Take 1 capsule by mouth in the morning and at bedtime.   Xtampza ER 27 MG C12a Generic drug: oxyCODONE ER Take 1 capsule by mouth in the morning and at bedtime.        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  [facility]    Gastro-esophageal reflux disease without esophagitis 06/03/2012   Gout 10/03/2016   HTN (hypertension) 06/03/2012   Hypercalcemia 03/08/2020   Hyperkalemia, diminished renal excretion 01/05/2019   Hypertension    Hypothyroidism 06/03/2012   Iron deficiency anemia 10/02/2016   Lumbar pseudoarthrosis 06/28/2016   Formatting of this note might be different from the original. Added automatically from request for surgery [id number]   Memory deficit 06/27/2018   MMSE 06/2017: 27/30. Pass is 28.    Nonspecific abnormal electrocardiogram (ECG) (EKG) 07/14/2019   Normal cardiac stress test 06/09/2012   at [facility] normal cardiac stress test (surgery preop); Dr Lyndel Safe   Osteoarthritis 10/02/2016   Pain in right hip 01/03/2012   Formatting of this note might be different from the original. Overview:  Right greater than left. MRI done 2013 shows right hip has partial posterior labral tear. Also has component of greater trochanteric bursitis and had corticosteroid injection with good relief in early July, 2012   Paresthesia of both legs 06/12/2012   Pelvic mass in female 06/24/2020   Formatting of this note might be different from the original.  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 MRI done 2013 shows right hip has partial posterior labral tear. Also has component of greater trochanteric bursitis and had corticosteroid injection with good relief in early July, 2012    S/P lumbar fusion 10/16/2012   Thrombocytopenia, unspecified (HCC) 02/12/2013   Formatting of this note might be different from the original. 10/1 IMO update   Type 2 diabetes mellitus with stage 4 chronic kidney disease (HCC) 06/03/2012   Uncontrolled type 2 diabetes with renal  manifestation 10/16/2019   Vitamin D deficiency    Weakness 06/12/2012   Past Surgical History:  Procedure Laterality Date   BACK SURGERY     Fibroid removed     Family History  Problem Relation Age of Onset   Diabetes Mother    Diabetes Brother    Diabetes Son    Social History   Socioeconomic History   Marital status: Widowed    Spouse name: Not on file   Number of children:  5   Years of education: 14   Highest education level: GED or equivalent  Occupational History   Occupation: retired  Tobacco Use   Smoking status: Former    Current packs/day: 0.00    Types: Cigarettes    Quit date: 09/08/1979    Years since quitting: 43.4   Smokeless tobacco: Never  Substance and Sexual Activity   Alcohol use: No   Drug use: No   Sexual activity: Never  Other Topics Concern   Not on file  Social History Narrative   Lives alone. She has five children. She enjoys watching t.v.   Social Determinants of Health   Financial Resource Strain: Low Risk  (02/25/2023)   Overall Financial Resource Strain (CARDIA)    Difficulty of Paying Living Expenses: Not hard at all  Food Insecurity: No Food Insecurity (02/25/2023)   Hunger Vital Sign    Worried About Running Out of Food in the Last Year: Never true    Ran Out of Food in the Last Year: Never true  Transportation Needs: No Transportation Needs (02/25/2023)   PRAPARE - Administrator, Civil Service (Medical): No    Lack of Transportation (Non-Medical): No  Physical Activity: Insufficiently Active (02/25/2023)   Exercise Vital Sign    Days of Exercise per Week: 2 days    Minutes of Exercise per Session: 30 min  Stress: No Stress Concern Present (02/25/2023)   [employer] of Occupational Health - Occupational Stress Questionnaire    Feeling of Stress : Not at all  Social Connections: Moderately Integrated (02/25/2023)   Social Connection and Isolation Panel [NHANES]    Frequency of Communication with Friends and  Family: More than three times a week    Frequency of Social Gatherings with Friends and Family: Once a week    Attends Religious Services: More than 4 times per year    Active Member of [employer] or Organizations: Yes    Attends Banker Meetings: More than 4 times per year    Marital Status: Widowed    Activities of Daily Living    02/25/2023    2:36 PM  In your present state of health, do you have any difficulty performing the following activities:  Hearing? 0  Comment per patient she has ringing in her ears  Vision? 0  Comment wears glasses  Difficulty concentrating or making decisions? 1  Comment some memory problems  Walking or climbing stairs? 0  Comment does not climb stairs  Dressing or bathing? 0  Doing errands, shopping? 0  Preparing Food and eating ? N  Using the Toilet? N  In the past six months, have you accidently leaked urine? N  Do you have problems with loss of bowel control? N  Managing your Medications? Y  Comment her daughter sets up her pill box  Managing your Finances? Y  Comment her daughter helps with finances  Housekeeping or managing your Housekeeping? N    Patient Education/ Literacy How often do you need to have someone help you when you read instructions, pamphlets, or other written materials from your doctor or pharmacy?: 1 - Never What is the last grade level you completed in school?: 2 years of technical school.  Exercise    Diet Patient reports consuming 1 meals a day and 2 snack(s) a day Patient reports that her primary diet is: Regular Patient reports that she does have regular access to food.   Depression Screen    02/25/2023  2:34 PM 12/07/2022    1:33 PM 09/04/2022    1:18 PM 06/14/2022   12:04 PM 01/19/2022    1:31 PM 08/29/2021    1:30 PM 10/18/2020    2:06 PM  PHQ 2/9 Scores  PHQ - 2 Score 1 1 0 0 0 0 0     Fall Risk    02/25/2023    2:33 PM 12/07/2022    1:33 PM 09/04/2022    1:17 PM 06/14/2022   12:04 PM  01/19/2022    1:31 PM  Fall Risk   Falls in the past year? 1 1 0 0 0  Number falls in past yr: 0 0 0 0 0  Injury with Fall? 0 0 0 0 0  Risk for fall due to : History of fall(s) No Fall Risks No Fall Risks No Fall Risks No Fall Risks  Follow up Falls evaluation completed;Education provided;Falls prevention discussed Falls evaluation completed Falls evaluation completed Falls evaluation completed Falls evaluation completed     Objective:  Barbara Yu seemed alert and oriented and she participated appropriately during our telephone visit.  Blood Pressure Weight BMI  BP Readings from Last 3 Encounters:  12/07/22 119/66  09/04/22 118/74  06/14/22 117/68   Wt Readings from Last 3 Encounters:  12/07/22 143 lb (64.9 kg)  09/04/22 142 lb 11.2 oz (64.7 kg)  06/14/22 149 lb (67.6 kg)   BMI Readings from Last 1 Encounters:  12/07/22 24.55 kg/m    *Unable to obtain current vital signs, weight, and BMI due to telephone visit type  Hearing/Vision  Barbara Yu did not seem to have difficulty with hearing/understanding during the telephone conversation Reports that she has had a formal eye exam by an eye care professional within the past year Reports that she has not had a formal hearing evaluation within the past year *Unable to fully assess hearing and vision during telephone visit type  Cognitive Function:    02/25/2023    2:42 PM 01/19/2022    1:45 PM 10/18/2020    2:15 PM  6CIT Screen  What Year? 0 points 0 points 0 points  What month? 0 points 0 points 0 points  What time? 0 points 0 points 0 points  Count back from 20 0 points 0 points 0 points  Months in reverse 0 points 0 points 0 points  Repeat phrase 6 points 0 points 2 points  Total Score 6 points 0 points 2 points   (Normal:0-7, Significant for Dysfunction: >8)  Normal Cognitive Function Screening: Yes   Immunization & Health Maintenance Record Immunization History  Administered Date(s) Administered   Fluad  Quad(high Dose 65+) 02/03/2018, 01/31/2022   Fluad Trivalent(High Dose 65+) 12/07/2022   Influenza Split 03/02/2014   Influenza, High Dose Seasonal PF 01/18/2015, 01/05/2016, 01/09/2019, 01/18/2020   Influenza,inj,Quad PF,6+ Mos 02/03/2018   Influenza,trivalent, recombinat, inj, PF 02/05/2012, 01/21/2013   Influenza-Unspecified 02/21/2013, 01/29/2017, 02/03/2018   Moderna Covid-19 Vaccine Bivalent Booster 52yrs & up 03/21/2021   Moderna Sars-Covid-2 Vaccination 05/25/2019, 06/22/2019, 02/04/2020, 12/14/2020   PNEUMOCOCCAL CONJUGATE-20 03/06/2022   Pfizer(Comirnaty)Fall Seasonal Vaccine 12 years and older 01/08/2022, 02/11/2023   Pneumococcal Conjugate-13 05/05/2013   Pneumococcal Polysaccharide-23 09/15/2009   Tdap 04/29/2017   Zoster Recombinant(Shingrix) 03/05/2019, 07/21/2019   Zoster, Live 04/23/2006    Health Maintenance  Topic Date Due   FOOT EXAM  05/23/2023   HEMOGLOBIN A1C  05/24/2023   OPHTHALMOLOGY EXAM  08/01/2023   Diabetic kidney evaluation - eGFR measurement  12/07/2023   Diabetic kidney  evaluation - Urine ACR  12/07/2023   Medicare Annual Wellness (AWV)  02/25/2024   DTaP/Tdap/Td (2 - Td or Tdap) 04/30/2027   DEXA SCAN  02/20/2028   Pneumonia Vaccine 31+ Years old  Completed   INFLUENZA VACCINE  Completed   COVID-19 Vaccine  Completed   Hepatitis C Screening  Completed   Zoster Vaccines- Shingrix  Completed   HPV VACCINES  Aged Out   Colonoscopy  Discontinued       Assessment  This is a routine wellness examination for [employer].  Health Maintenance: Due or Overdue There are no preventive care reminders to display for this patient.   Barbara Yu does not need a referral for Community Assistance: Care Management:   no Social Work:    no Prescription Assistance:  no Nutrition/Diabetes Education:  no   Plan:  Personalized Goals  Goals Addressed               This Visit's Progress     Patient Stated (pt-stated)        Patient  stated that she would like to get out more.        Personalized Health Maintenance & Screening Recommendations  There are no preventive care reminders to display for this patient.  Lung Cancer Screening Recommended: no (Low Dose CT Chest recommended if Age 75-80 years, 20 pack-year currently smoking OR have quit w/in past 15 years) Hepatitis C Screening recommended: no HIV Screening recommended: no  Advanced Directives: Written information was not prepared per patient's request.  Referrals & Orders No orders of the defined types were placed in this encounter.   Follow-up Plan Follow-up with Agapito Games, MD as planned Medicare wellness visit in one year.  Patient will access AVS on my chart.   I have personally reviewed and noted the following in the patient's chart:   Medical and social history Use of alcohol, tobacco or illicit drugs  Current medications and supplements Functional ability and status Nutritional status Physical activity Advanced directives List of other physicians Hospitalizations, surgeries, and ER visits in previous 12 months Vitals Screenings to include cognitive, depression, and falls Referrals and appointments  In addition, I have reviewed and discussed with Barbara Yu certain preventive protocols, quality metrics, and best practice recommendations. A written personalized care plan for preventive services as well as general preventive health recommendations is available and can be mailed to the patient at her request.      Modesto Charon, RN BSN  02/25/2023

## 2023-02-27 ENCOUNTER — Encounter: Payer: Self-pay | Admitting: Cardiology

## 2023-02-27 ENCOUNTER — Ambulatory Visit: Payer: Medicare Other | Attending: Cardiology | Admitting: Cardiology

## 2023-02-27 VITALS — BP 116/56 | HR 73 | Ht 64.0 in | Wt 138.0 lb

## 2023-02-27 DIAGNOSIS — I10 Essential (primary) hypertension: Secondary | ICD-10-CM

## 2023-02-27 DIAGNOSIS — N184 Chronic kidney disease, stage 4 (severe): Secondary | ICD-10-CM

## 2023-02-27 DIAGNOSIS — R0609 Other forms of dyspnea: Secondary | ICD-10-CM

## 2023-02-27 DIAGNOSIS — I209 Angina pectoris, unspecified: Secondary | ICD-10-CM | POA: Diagnosis present

## 2023-02-27 DIAGNOSIS — E785 Hyperlipidemia, unspecified: Secondary | ICD-10-CM | POA: Diagnosis present

## 2023-02-27 DIAGNOSIS — Z794 Long term (current) use of insulin: Secondary | ICD-10-CM | POA: Diagnosis present

## 2023-02-27 DIAGNOSIS — E1122 Type 2 diabetes mellitus with diabetic chronic kidney disease: Secondary | ICD-10-CM | POA: Insufficient documentation

## 2023-02-27 NOTE — Progress Notes (Signed)
Cardiology Office Note:    Date:  02/27/2023   ID:  Barbara Yu, DOB 1946/03/11, MRN [id number]  PCP:  Agapito Games, MD  Cardiologist:  Gypsy Balsam, MD    Referring MD: Agapito Games, *   Chief Complaint  Patient presents with   Follow-up    History of Present Illness:    Barbara Yu is a 77 y.o. female with past medical history significant for essential hypertension, diabetes, chronic kidney failure with GFR between 15 and 30 initially presented with typical angina pectoris but because of kidneys we decided to start with stress test she had related to stress test both were negative.  Also EKG shows some changes in the anterior precordium indicating LAD disease however she is completely asymptomatic.  She comes today she said she can do what she wants to do no problem she thinks previously she got some pain because of stressful situation now things settle down and she is doing well she said she can go outside and walk and do well.  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  [facility]    Gastro-esophageal reflux disease without esophagitis 06/03/2012   Gout 10/03/2016   HTN (hypertension) 06/03/2012   Hypercalcemia 03/08/2020   Hyperkalemia, diminished renal excretion 01/05/2019   Hypertension    Hypothyroidism 06/03/2012   Iron deficiency anemia 10/02/2016   Lumbar pseudoarthrosis  06/28/2016   Formatting of this note might be different from the original. Added automatically from request for surgery [id number]   Memory deficit 06/27/2018   MMSE 06/2017: 27/30. Pass is 28.    Nonspecific abnormal electrocardiogram (ECG) (EKG) 07/14/2019   Normal cardiac stress test 06/09/2012   at [facility] normal cardiac stress test (surgery preop); Dr Lyndel Safe   Osteoarthritis 10/02/2016   Pain in right hip 01/03/2012   Formatting of this note might be different from the original. Overview:  Right greater than left. MRI done 2013 shows right hip has partial posterior labral tear. Also has component of greater trochanteric bursitis and had corticosteroid injection with good relief in early July, 2012   Paresthesia of both legs 06/12/2012   Pelvic mass in female 06/24/2020   Formatting of this note might be different from the original.  Added automatically from request for surgery [id number]   Peripheral neuropathy 10/02/2016   Precordial chest pain 07/14/2019   Primary osteoarthritis of both hips 01/03/2012   Right greater than left. MRI done 2013 shows right hip has partial posterior labral tear. Also has component of greater trochanteric bursitis and had corticosteroid injection with good relief in early July, 2012    S/P lumbar fusion 10/16/2012   Thrombocytopenia, unspecified (HCC) 02/12/2013   Formatting of this note might be different from the original. 10/1 IMO update   Type 2 diabetes mellitus with stage 4 chronic kidney disease (HCC) 06/03/2012   Uncontrolled type 2 diabetes with renal manifestation 10/16/2019   Vitamin D deficiency  Weakness 06/12/2012    Past Surgical History:  Procedure Laterality Date   BACK SURGERY     Fibroid removed      Current Medications: Current Meds  Medication Sig   acetaminophen (TYLENOL) 325 MG tablet Take 650 mg by mouth as needed for mild pain or moderate pain.   albuterol (PROAIR HFA) 108 (90 Base) MCG/ACT inhaler Inhale 2 puffs into the  lungs every 6 (six) hours as needed for wheezing or shortness of breath. USE 2 INHALATIONS EVERY 6 HOURS AS NEEDED   allopurinol (ZYLOPRIM) 300 MG tablet Take 1 tablet (300 mg total) by mouth daily.   amLODipine (NORVASC) 5 MG tablet Take 1 tablet (5 mg total) by mouth daily.   clonazePAM (KLONOPIN) 1 MG tablet Take 1 mg by mouth at bedtime as needed for anxiety.   colchicine 0.6 MG tablet Take 1 tablet (0.6 mg total) by mouth daily as needed. (Patient taking differently: Take 0.6 mg by mouth daily as needed (Gout flare up).)   diclofenac Sodium (VOLTAREN) 1 % GEL Apply 4 g topically 4 (four) times daily.   estradiol (ESTRACE) 0.1 MG/GM vaginal cream Place 1 Applicatorful vaginally at bedtime. X 2 weeks and then twice a week.   glucose blood (FREESTYLE LITE) test strip For testing up to 4 times daily. DX: E11.9 (Patient taking differently: 1 each by Other route See admin instructions. For testing up to 4 times daily. DX: E11.9)   insulin glargine (LANTUS SOLOSTAR) 100 UNIT/ML Solostar Pen Inject 15-25 Units into the skin daily.   isosorbide mononitrate (IMDUR) 120 MG 24 hr tablet Take 1 tablet (120 mg total) by mouth daily. Patient needs appointment for further refills. 1 st attempt   lansoprazole (PREVACID) 15 MG capsule Take 1 capsule (15 mg total) by mouth daily at 12 noon.   levothyroxine (SYNTHROID) 75 MCG tablet Take 1 tablet (75 mcg total) by mouth daily.   metoprolol succinate (TOPROL XL) 50 MG 24 hr tablet Take 1.5 tablets (75 mg total) by mouth daily. Take with or immediately following a meal.   montelukast (SINGULAIR) 10 MG tablet TAKE 1 TABLET AT BEDTIME   MOUNJARO 5 MG/0.5ML Pen Inject 5 mg into the skin once a week.   NARCAN 4 MG/0.1ML LIQD nasal spray kit Place 1 spray into the nose once.   nitroGLYCERIN (NITROSTAT) 0.4 MG SL tablet Place 1 tablet (0.4 mg total) under the tongue every 5 (five) minutes as needed for chest pain.   oxyCODONE ER (XTAMPZA ER) 27 MG C12A Take 1 capsule by  mouth in the morning and at bedtime.   oxyCODONE ER (XTAMPZA ER) 27 MG C12A Take 1 capsule by mouth in the morning and at bedtime.   oxyCODONE ER (XTAMPZA ER) 27 MG C12A Take 1 capsule by mouth in the morning and at bedtime.   Oxycodone HCl 10 MG TABS Take 1 tablet (10 mg total) by mouth in the morning, at noon, in the evening, and at bedtime.   Oxycodone HCl 10 MG TABS Take 1 tablet (10 mg total) by mouth in the morning, at noon, in the evening, and at bedtime.   Oxycodone HCl 10 MG TABS Take 1 tablet (10 mg total) by mouth in the morning, at noon, in the evening, and at bedtime.   pravastatin (PRAVACHOL) 10 MG tablet Take 10 mg by mouth at bedtime.   SURE COMFORT PEN NEEDLES 31G X 8 MM MISC USE WITH INSULIN THREE TIMES A DAY (Patient taking differently: 1 each by  Other route See admin instructions. USE WITH INSULIN THREE TIMES A DAY)   SYNTHROID 50 MCG tablet TAKE 1 TABLET TWO TIMES A WEEK. ON SUNDAY AND WEDNESDAY. ALTERNATING WITH 75 MCG THE OTHER 5 DAYS (Patient taking differently: Take 50 mcg by mouth daily before breakfast.)   tolterodine (DETROL LA) 4 MG 24 hr capsule Take 1 capsule (4 mg total) by mouth daily.   Triamcinolone Acetonide (TRIAMCINOLONE 0.1 % CREAM : EUCERIN) CREA Apply 1 Application topically 2 (two) times daily as needed for itching. 1 jar. Mix 1:1     Allergies:   Amoxicillin, Atorvastatin, Hydrochlorothiazide, Nsaids, Tramadol, Allopurinol, Morphine, Omeprazole, Pepcid [famotidine], Trulicity [dulaglutide], Ciprofloxacin, Gabapentin, and Pantoprazole   Social History   Socioeconomic History   Marital status: Widowed    Spouse name: Not on file   Number of children: 5   Years of education: 14   Highest education level: GED or equivalent  Occupational History   Occupation: retired  Tobacco Use   Smoking status: Former    Current packs/day: 0.00    Types: Cigarettes    Quit date: 09/08/1979    Years since quitting: 43.5   Smokeless tobacco: Never  Substance and  Sexual Activity   Alcohol use: No   Drug use: No   Sexual activity: Never  Other Topics Concern   Not on file  Social History Narrative   Lives alone. She has five children. She enjoys watching t.v.   Social Determinants of Health   Financial Resource Strain: Low Risk  (02/25/2023)   Overall Financial Resource Strain (CARDIA)    Difficulty of Paying Living Expenses: Not hard at all  Food Insecurity: No Food Insecurity (02/25/2023)   Hunger Vital Sign    Worried About Running Out of Food in the Last Year: Never true    Ran Out of Food in the Last Year: Never true  Transportation Needs: No Transportation Needs (02/25/2023)   PRAPARE - Administrator, Civil Service (Medical): No    Lack of Transportation (Non-Medical): No  Physical Activity: Insufficiently Active (02/25/2023)   Exercise Vital Sign    Days of Exercise per Week: 2 days    Minutes of Exercise per Session: 30 min  Stress: No Stress Concern Present (02/25/2023)   [employer] of Occupational Health - Occupational Stress Questionnaire    Feeling of Stress : Not at all  Social Connections: Moderately Integrated (02/25/2023)   Social Connection and Isolation Panel [NHANES]    Frequency of Communication with Friends and Family: More than three times a week    Frequency of Social Gatherings with Friends and Family: Once a week    Attends Religious Services: More than 4 times per year    Active Member of [employer] or Organizations: Yes    Attends Banker Meetings: More than 4 times per year    Marital Status: Widowed     Family History: The patient's family history includes Diabetes in her brother, mother, and son. ROS:   Please see the history of present illness.    All 14 point review of systems negative except as described per history of present illness  EKGs/Labs/Other Studies Reviewed:    EKG Interpretation Date/Time:  Wednesday February 27 2023 14:23:55 EST Ventricular Rate:  73 PR  Interval:  168 QRS Duration:  70 QT Interval:  376 QTC Calculation: 414 R Axis:   -10  Text Interpretation: Normal sinus rhythm Minimal voltage criteria for LVH, may be normal variant ( R  in aVL ) No previous ECGs available Confirmed by Gypsy Balsam [phone]) on 02/27/2023 2:32:07 PM    Recent Labs: 05/22/2022: TSH 0.90 12/07/2022: ALT 41; BUN 16; Creatinine, Ser 1.43; Potassium 5.0; Sodium 139  Recent Lipid Panel    Component Value Date/Time   CHOL 133 12/07/2022 1424   TRIG 218 (H) 12/07/2022 1424   HDL 46 12/07/2022 1424   CHOLHDL 2.9 12/07/2022 1424   CHOLHDL 3.3 02/23/2019 1356   LDLCALC 52 12/07/2022 1424   LDLCALC 72 02/23/2019 1356   LDLDIRECT 56 07/11/2021 1513    Physical Exam:    VS:  BP (!) 116/56 (BP Location: Left Arm, Patient Position: Sitting)   Pulse 73   Ht 5\' 4"  (1.626 m)   Wt 138 lb (62.6 kg)   SpO2 97%   BMI 23.69 kg/m     Wt Readings from Last 3 Encounters:  02/27/23 138 lb (62.6 kg)  12/07/22 143 lb (64.9 kg)  09/04/22 142 lb 11.2 oz (64.7 kg)     GEN:  Well nourished, well developed in no acute distress HEENT: Normal NECK: No JVD; No carotid bruits LYMPHATICS: No lymphadenopathy CARDIAC: RRR, no murmurs, no rubs, no gallops RESPIRATORY:  Clear to auscultation without rales, wheezing or rhonchi  ABDOMEN: Soft, non-tender, non-distended MUSCULOSKELETAL:  No edema; No deformity  SKIN: Warm and dry LOWER EXTREMITIES: no swelling NEUROLOGIC:  Alert and oriented x 3 PSYCHIATRIC:  Normal affect   ASSESSMENT:    1. Dyspnea on exertion   2. Angina pectoris (HCC)   3. Primary hypertension   4. Type 2 diabetes mellitus with stage 4 chronic kidney disease, with long-term current use of insulin (HCC)   5. Dyslipidemia    PLAN:    In order of problems listed above:  Dyspnea on exertion stable. Angina pectoris: Denies having any she said she is feeling fine.  I brought issue again about potentially doing some more aggressive workup and her  coronary artery disease but she said she is feeling well asymptomatic does not want to do anything. Essential hypertension blood pressure well-controlled. Dyslipidemia I did review K PN which show me LDL 52 HDL 46 will continue present management.   Medication Adjustments/Labs and Tests Ordered: Current medicines are reviewed at length with the patient today.  Concerns regarding medicines are outlined above.  Orders Placed This Encounter  Procedures   EKG 12-Lead   Medication changes: No orders of the defined types were placed in this encounter.   Signed, Georgeanna Lea, MD, [facility] 02/27/2023 2:39 PM    [facility]

## 2023-02-27 NOTE — Patient Instructions (Signed)

## 2023-03-12 ENCOUNTER — Encounter: Payer: Self-pay | Admitting: Family Medicine

## 2023-03-12 ENCOUNTER — Encounter: Payer: Self-pay | Admitting: Cardiology

## 2023-03-12 ENCOUNTER — Ambulatory Visit (INDEPENDENT_AMBULATORY_CARE_PROVIDER_SITE_OTHER): Payer: Medicare Other | Admitting: Family Medicine

## 2023-03-12 VITALS — BP 122/58 | HR 75 | Ht 64.0 in | Wt 143.0 lb

## 2023-03-12 DIAGNOSIS — M1A0790 Idiopathic chronic gout, unspecified ankle and foot, without tophus (tophi): Secondary | ICD-10-CM | POA: Diagnosis not present

## 2023-03-12 DIAGNOSIS — E1122 Type 2 diabetes mellitus with diabetic chronic kidney disease: Secondary | ICD-10-CM

## 2023-03-12 DIAGNOSIS — E038 Other specified hypothyroidism: Secondary | ICD-10-CM

## 2023-03-12 DIAGNOSIS — N184 Chronic kidney disease, stage 4 (severe): Secondary | ICD-10-CM

## 2023-03-12 DIAGNOSIS — G8929 Other chronic pain: Secondary | ICD-10-CM | POA: Diagnosis not present

## 2023-03-12 DIAGNOSIS — Z794 Long term (current) use of insulin: Secondary | ICD-10-CM

## 2023-03-12 DIAGNOSIS — J302 Other seasonal allergic rhinitis: Secondary | ICD-10-CM

## 2023-03-12 MED ORDER — AZELASTINE HCL 0.1 % NA SOLN: 2.0000 | Freq: Two times a day (BID) | NASAL | 1 refills | Status: DC | PRN

## 2023-03-12 MED ORDER — OXYCODONE HCL 10 MG PO TABS: 10.0000 mg | ORAL_TABLET | Freq: Four times a day (QID) | ORAL | 0 refills | Status: DC

## 2023-03-12 MED ORDER — XTAMPZA ER 27 MG PO C12A: 1.0000 | EXTENDED_RELEASE_CAPSULE | Freq: Two times a day (BID) | ORAL | 0 refills | Status: DC

## 2023-03-12 NOTE — Assessment & Plan Note (Signed)
Feels like her gout is doing better.

## 2023-03-12 NOTE — Assessment & Plan Note (Addendum)
Indication for chronic opioid: Failed back surgical syndrome Medication and dose: Xtampza ER 27 mg BID.  Oxycodone 10 mg 4 times daily # pills per month: 60, 120 Last UDS date: 12/07/22 Opioid Treatment Agreement signed (Y/N): Y Opioid Treatment Agreement last reviewed with patient:  06/14/22 NCCSRS reviewed this encounter (include red flags):  Yes

## 2023-03-12 NOTE — Progress Notes (Signed)
Established Patient Office Visit  Subjective   Patient ID: Barbara Yu, female    DOB: 15-Jan-1946  Age: 77 y.o. MRN: [id number]  Chief Complaint  Patient presents with   Pain Management    HPI  F/U chronic pain mgt for failed back surgery  -well on her Xtampza.  She also gets a little bit more pain this time a year when the weather gets cooler but she has been trying to manage it with doing her stretches at home heat/ice.  Endocrinology dec her mounjaro back down to 2.5mg .  she was having some gut gurling and diarrhea.    Also still struggling with the runny nose she does not really feel like the montelukast was helpful.  She has been taken over-the-counter tab occasionally it seems to happen more when she goes outdoors she definitely feels like it seasonally triggered.    ROS    Objective:     BP (!) 122/58   Pulse 75   Ht 5\' 4"  (1.626 m)   Wt 143 lb (64.9 kg)   SpO2 100%   BMI 24.55 kg/m    Physical Exam Vitals and nursing note reviewed.  Constitutional:      Appearance: Normal appearance.  HENT:     Head: Normocephalic and atraumatic.  Eyes:     Conjunctiva/sclera: Conjunctivae normal.  Cardiovascular:     Rate and Rhythm: Normal rate and regular rhythm.  Pulmonary:     Effort: Pulmonary effort is normal.     Breath sounds: Normal breath sounds.  Skin:    General: Skin is warm and dry.  Neurological:     Mental Status: She is alert.  Psychiatric:        Mood and Affect: Mood normal.      No results found for any visits on 03/12/23.    The 10-year ASCVD risk score (Arnett DK, et al., 2019) is: 24.4%    Assessment & Plan:   Problem List Items Addressed This Visit       Respiratory   AR (allergic rhinitis)    Montelukast not helpful we will try switching to a nasal antihistamine.  Will do a trial of Astelin nasal spray to be used up to twice a day as needed.        Endocrine   Type 2 diabetes mellitus with stage 4 chronic kidney  disease (HCC)    Now on dec dose mounjaro.  Has been able to gain a couple of lbs.       Hypothyroidism    Follow with endocrinology         Other   Gout - Primary    Feels like her gout is doing better.        Encounter for chronic pain management    Indication for chronic opioid: Failed back surgical syndrome Medication and dose: Xtampza ER 27 mg BID.  Oxycodone 10 mg 4 times daily # pills per month: 60, 120 Last UDS date: 12/07/22 Opioid Treatment Agreement signed (Y/N): Y Opioid Treatment Agreement last reviewed with patient:  06/14/22 NCCSRS reviewed this encounter (include red flags):  Yes      Relevant Medications   oxyCODONE ER (XTAMPZA ER) 27 MG C12A   oxyCODONE ER (XTAMPZA ER) 27 MG C12A (Start on 04/10/2023)   oxyCODONE ER (XTAMPZA ER) 27 MG C12A (Start on 05/10/2023)   Oxycodone HCl 10 MG TABS   Oxycodone HCl 10 MG TABS (Start on 04/10/2023)   Oxycodone HCl 10 MG TABS (  Start on 05/10/2023)    Return in about 3 months (around 06/12/2023) for Chronic Pain Mgt.    Nani Gasser, MD

## 2023-03-12 NOTE — Assessment & Plan Note (Signed)
Follow with endocrinology

## 2023-03-12 NOTE — Assessment & Plan Note (Signed)
Now on dec dose mounjaro.  Has been able to gain a couple of lbs.

## 2023-03-12 NOTE — Assessment & Plan Note (Signed)
Montelukast not helpful we will try switching to a nasal antihistamine.  Will do a trial of Astelin nasal spray to be used up to twice a day as needed.

## 2023-03-13 ENCOUNTER — Other Ambulatory Visit: Payer: Self-pay

## 2023-03-13 MED ORDER — NITROGLYCERIN 0.4 MG SL SUBL: 0.4000 mg | SUBLINGUAL_TABLET | SUBLINGUAL | 11 refills | Status: AC | PRN

## 2023-03-13 MED ORDER — ISOSORBIDE MONONITRATE ER 120 MG PO TB24: 120.0000 mg | ORAL_TABLET | Freq: Every day | ORAL | 3 refills | Status: AC

## 2023-04-13 ENCOUNTER — Other Ambulatory Visit: Payer: Self-pay | Admitting: Family Medicine

## 2023-04-13 ENCOUNTER — Encounter: Payer: Self-pay | Admitting: Family Medicine

## 2023-04-13 DIAGNOSIS — G8929 Other chronic pain: Secondary | ICD-10-CM

## 2023-04-13 MED ORDER — OXYCODONE HCL 10 MG PO TABS: 10.0000 mg | ORAL_TABLET | Freq: Four times a day (QID) | ORAL | 0 refills | Status: DC

## 2023-04-13 MED ORDER — XTAMPZA ER 27 MG PO C12A: 1.0000 | EXTENDED_RELEASE_CAPSULE | Freq: Two times a day (BID) | ORAL | 0 refills | Status: DC

## 2023-04-24 ENCOUNTER — Other Ambulatory Visit: Payer: Self-pay | Admitting: Cardiology

## 2023-04-26 ENCOUNTER — Other Ambulatory Visit: Payer: Self-pay | Admitting: *Deleted

## 2023-04-26 MED ORDER — RABEPRAZOLE SODIUM 20 MG PO TBEC: 20.0000 mg | DELAYED_RELEASE_TABLET | Freq: Every day | ORAL | 3 refills | Status: DC

## 2023-04-29 ENCOUNTER — Other Ambulatory Visit: Payer: Self-pay | Admitting: Family Medicine

## 2023-04-30 LAB — HEMOGLOBIN A1C: Hemoglobin A1C: 6.3

## 2023-06-11 ENCOUNTER — Encounter: Payer: Self-pay | Admitting: Family Medicine

## 2023-06-11 ENCOUNTER — Telehealth: Payer: Self-pay | Admitting: Family Medicine

## 2023-06-11 ENCOUNTER — Ambulatory Visit (INDEPENDENT_AMBULATORY_CARE_PROVIDER_SITE_OTHER): Payer: Medicare Other | Admitting: Family Medicine

## 2023-06-11 VITALS — BP 120/62 | HR 83 | Ht 64.0 in | Wt 142.0 lb

## 2023-06-11 DIAGNOSIS — I10 Essential (primary) hypertension: Secondary | ICD-10-CM | POA: Diagnosis not present

## 2023-06-11 DIAGNOSIS — E1122 Type 2 diabetes mellitus with diabetic chronic kidney disease: Secondary | ICD-10-CM

## 2023-06-11 DIAGNOSIS — G8929 Other chronic pain: Secondary | ICD-10-CM

## 2023-06-11 DIAGNOSIS — Z794 Long term (current) use of insulin: Secondary | ICD-10-CM

## 2023-06-11 DIAGNOSIS — M961 Postlaminectomy syndrome, not elsewhere classified: Secondary | ICD-10-CM | POA: Diagnosis not present

## 2023-06-11 DIAGNOSIS — N184 Chronic kidney disease, stage 4 (severe): Secondary | ICD-10-CM

## 2023-06-11 MED ORDER — XTAMPZA ER 27 MG PO C12A: 1.0000 | EXTENDED_RELEASE_CAPSULE | Freq: Two times a day (BID) | ORAL | 0 refills | Status: DC

## 2023-06-11 MED ORDER — OXYCODONE HCL 10 MG PO TABS: 10.0000 mg | ORAL_TABLET | Freq: Four times a day (QID) | ORAL | 0 refills | Status: DC

## 2023-06-11 NOTE — Assessment & Plan Note (Signed)
Follows with endocrinology.  Last A1c in October was 7.0.

## 2023-06-11 NOTE — Assessment & Plan Note (Signed)
Indication for chronic opioid: Failed back surgical syndrome Medication and dose: Xtampza ER 27 mg BID.  Oxycodone 10 mg 4 times daily # pills per month: 60, 120 Last UDS date: 06/11/23 Opioid Treatment Agreement signed (Y/N): Y Opioid Treatment Agreement last reviewed with patient:  02/2023 NCCSRS reviewed this encounter (include red flags):  Yes

## 2023-06-11 NOTE — Progress Notes (Signed)
Established Patient Office Visit  Subjective  Patient ID: Barbara Yu, female    DOB: 1945-06-18  Age: 78 y.o. MRN: [id number]  Chief Complaint  Patient presents with   Pain Management    HPI  Month follow-up for chronic pain management.  She has been getting the Krystexxa infusions for her gout and has been doing really well in fact she just recently had blood work done on January 7.  A1c looks great at 6.3 they also did a metabolic panel and kidney function was around 1.2.  Thyroid panel looked good.  And uric acid level was less than 0.2.     ROS    Objective:     BP 120/62   Pulse 83   Ht 5\' 4"  (1.626 m)   Wt 142 lb (64.4 kg)   SpO2 97%   BMI 24.37 kg/m    Physical Exam Vitals and nursing note reviewed.  Constitutional:      Appearance: Normal appearance.  HENT:     Head: Normocephalic and atraumatic.  Eyes:     Conjunctiva/sclera: Conjunctivae normal.  Cardiovascular:     Rate and Rhythm: Normal rate and regular rhythm.  Pulmonary:     Effort: Pulmonary effort is normal.     Breath sounds: Normal breath sounds.  Skin:    General: Skin is warm and dry.  Neurological:     Mental Status: She is alert.  Psychiatric:        Mood and Affect: Mood normal.      Results for orders placed or performed in visit on 06/11/23  Hemoglobin A1c  Result Value Ref Range   Hemoglobin A1C 6.3       The 10-year ASCVD risk score (Arnett DK, et al., 2019) is: 24%    Assessment & Plan:   Problem List Items Addressed This Visit       Cardiovascular and Mediastinum   HTN (hypertension) - Primary   Well controlled. Continue current regimen. Follow up in  6 months.          Endocrine   Type 2 diabetes mellitus with stage 4 chronic kidney disease (HCC)   Follows with endocrinology.  Last A1c in October was 7.0.        Other   Failed back surgical syndrome   Relevant Medications   oxyCODONE ER (XTAMPZA ER) 27 MG C12A   oxyCODONE ER (XTAMPZA ER)  27 MG C12A (Start on 07/08/2023)   oxyCODONE ER (XTAMPZA ER) 27 MG C12A (Start on 08/07/2023)   Oxycodone HCl 10 MG TABS   Oxycodone HCl 10 MG TABS (Start on 07/08/2023)   Oxycodone HCl 10 MG TABS (Start on 08/07/2023)   Other Relevant Orders   CMP14+EGFR   Encounter for chronic pain management   Indication for chronic opioid: Failed back surgical syndrome Medication and dose: Xtampza ER 27 mg BID.  Oxycodone 10 mg 4 times daily # pills per month: 60, 120 Last UDS date: 06/11/23 Opioid Treatment Agreement signed (Y/N): Y Opioid Treatment Agreement last reviewed with patient:  02/2023 NCCSRS reviewed this encounter (include red flags):  Yes      Relevant Medications   oxyCODONE ER (XTAMPZA ER) 27 MG C12A   oxyCODONE ER (XTAMPZA ER) 27 MG C12A (Start on 07/08/2023)   oxyCODONE ER (XTAMPZA ER) 27 MG C12A (Start on 08/07/2023)   Oxycodone HCl 10 MG TABS   Oxycodone HCl 10 MG TABS (Start on 07/08/2023)   Oxycodone HCl 10 MG TABS (Start on  08/07/2023)   Other Relevant Orders   Pain Management Screening Profile (10S)   CMP14+EGFR    Return in about 3 months (around 09/08/2023) for Chronic Pain Mgt.    Nani Gasser, MD

## 2023-06-11 NOTE — Assessment & Plan Note (Signed)
 Well controlled. Continue current regimen. Follow up in  6 months.

## 2023-06-11 NOTE — Telephone Encounter (Signed)
error 

## 2023-06-12 LAB — PMP SCREEN PROFILE (10S), URINE
Amphetamine Scrn, Ur: NEGATIVE ng/mL
BARBITURATE SCREEN URINE: NEGATIVE ng/mL
BENZODIAZEPINE SCREEN, URINE: NEGATIVE ng/mL
CANNABINOIDS UR QL SCN: NEGATIVE ng/mL
Cocaine (Metab) Scrn, Ur: NEGATIVE ng/mL
Creatinine(Crt), U: 302.6 mg/dL — ABNORMAL HIGH (ref 20.0–300.0)
Methadone Screen, Urine: NEGATIVE ng/mL
OXYCODONE+OXYMORPHONE UR QL SCN: POSITIVE ng/mL — AB
Opiate Scrn, Ur: POSITIVE ng/mL — AB
Ph of Urine: 5 (ref 4.5–8.9)
Phencyclidine Qn, Ur: NEGATIVE ng/mL
Propoxyphene Scrn, Ur: NEGATIVE ng/mL

## 2023-06-12 LAB — SPECIMEN STATUS REPORT

## 2023-06-19 ENCOUNTER — Other Ambulatory Visit: Payer: Self-pay | Admitting: Family Medicine

## 2023-07-20 ENCOUNTER — Other Ambulatory Visit: Payer: Self-pay | Admitting: Family Medicine

## 2023-07-30 LAB — LIPID PANEL
Cholesterol: 143 (ref 0–200)
HDL: 56 (ref 35–70)
LDL Cholesterol: 62
Triglycerides: 150 (ref 40–160)

## 2023-07-30 LAB — CBC: RBC: 4.15 (ref 3.87–5.11)

## 2023-07-30 LAB — BASIC METABOLIC PANEL WITH GFR
BUN: 36 — AB (ref 4–21)
CO2: 19 (ref 13–22)
Chloride: 105 (ref 99–108)
Creatinine: 1.7 — AB (ref 0.5–1.1)
Glucose: 100
Potassium: 5.2 meq/L — AB (ref 3.5–5.1)
Sodium: 141 (ref 137–147)

## 2023-07-30 LAB — CBC AND DIFFERENTIAL
HCT: 33 — AB (ref 36–46)
Hemoglobin: 10.2 — AB (ref 12.0–16.0)
Neutrophils Absolute: 38
Platelets: 192 10*3/uL (ref 150–400)
WBC: 6.9

## 2023-07-30 LAB — IRON,TIBC AND FERRITIN PANEL
%SAT: 29
Iron: 87
TIBC: 304
UIBC: 217

## 2023-07-30 LAB — COMPREHENSIVE METABOLIC PANEL WITH GFR
Albumin: 4.3 (ref 3.5–5.0)
Calcium: 9.4 (ref 8.7–10.7)
Globulin: 2.4
eGFR: 31

## 2023-07-30 LAB — HEPATIC FUNCTION PANEL
ALT: 11 U/L (ref 7–35)
AST: 23 (ref 13–35)
Bilirubin, Total: 0.3

## 2023-07-30 LAB — TSH: TSH: 0.77 (ref 0.41–5.90)

## 2023-07-30 LAB — HEMOGLOBIN A1C: Hemoglobin A1C: 6.7

## 2023-07-31 ENCOUNTER — Telehealth: Payer: Self-pay | Admitting: *Deleted

## 2023-07-31 ENCOUNTER — Encounter: Payer: Self-pay | Admitting: *Deleted

## 2023-07-31 NOTE — Telephone Encounter (Signed)
 Letter for trash/recycling written and mailed to:  [employer] [facility]  [address]  [place] [address]

## 2023-09-06 ENCOUNTER — Ambulatory Visit: Payer: Medicare Other | Admitting: Family Medicine

## 2023-09-06 ENCOUNTER — Encounter: Payer: Self-pay | Admitting: Family Medicine

## 2023-09-06 VITALS — BP 107/61 | HR 88 | Ht 64.0 in | Wt 136.2 lb

## 2023-09-06 DIAGNOSIS — I10 Essential (primary) hypertension: Secondary | ICD-10-CM | POA: Diagnosis not present

## 2023-09-06 DIAGNOSIS — R072 Precordial pain: Secondary | ICD-10-CM

## 2023-09-06 DIAGNOSIS — E1122 Type 2 diabetes mellitus with diabetic chronic kidney disease: Secondary | ICD-10-CM

## 2023-09-06 DIAGNOSIS — G8929 Other chronic pain: Secondary | ICD-10-CM

## 2023-09-06 DIAGNOSIS — M961 Postlaminectomy syndrome, not elsewhere classified: Secondary | ICD-10-CM | POA: Diagnosis not present

## 2023-09-06 DIAGNOSIS — Z794 Long term (current) use of insulin: Secondary | ICD-10-CM

## 2023-09-06 DIAGNOSIS — M5489 Other dorsalgia: Secondary | ICD-10-CM

## 2023-09-06 DIAGNOSIS — N184 Chronic kidney disease, stage 4 (severe): Secondary | ICD-10-CM

## 2023-09-06 LAB — POCT URINALYSIS DIP (CLINITEK)
Bilirubin, UA: NEGATIVE
Blood, UA: NEGATIVE
Glucose, UA: NEGATIVE mg/dL
Ketones, POC UA: NEGATIVE mg/dL
Leukocytes, UA: NEGATIVE
Nitrite, UA: NEGATIVE
POC PROTEIN,UA: NEGATIVE
Spec Grav, UA: 1.02 (ref 1.010–1.025)
Urobilinogen, UA: 0.2 U/dL
pH, UA: 5.5 (ref 5.0–8.0)

## 2023-09-06 MED ORDER — OXYCODONE HCL 15 MG PO TABS: 15.0000 mg | ORAL_TABLET | Freq: Four times a day (QID) | ORAL | 0 refills | Status: DC | PRN

## 2023-09-06 MED ORDER — XTAMPZA ER 27 MG PO C12A: 1.0000 | EXTENDED_RELEASE_CAPSULE | Freq: Two times a day (BID) | ORAL | 0 refills | Status: DC

## 2023-09-06 NOTE — Assessment & Plan Note (Addendum)
 Recently followed up with endocrinology.  They recommended continuing the Lantus  and the Mounjaro.

## 2023-09-06 NOTE — Assessment & Plan Note (Signed)
 She did let me know she ended up taking her nitroglycerin  when she got really stressed when she found out that her insurance was not going to cover some of the infusions because the Medicare had been changed but she has been able to get that corrected so they should back pay those medical bills.

## 2023-09-06 NOTE — Patient Instructions (Addendum)
 Please schedule an eye exam when you can.

## 2023-09-06 NOTE — Progress Notes (Signed)
 Established Patient Office Visit  Subjective  Patient ID: Barbara Yu, female    DOB: Dec 16, 1945  Age: 78 y.o. MRN: [id number]  Chief Complaint  Patient presents with   Pain Management    HPI  F/U chronic pain mgt for failed back surgery -been on her current dose of Xtampza  for some time she currently takes 27 mg twice a day and then takes oxycodone  10 mg up to 4 times a day as needed for breakthrough pain.  She has been struggling with more back pain.  It has been particularly more intense on the right.  Though she is also had some increase in pain because she has not been able to get her infusions for gout this put past month.  It normally makes a big difference for her.  She did follow-up with endocrinology recently on April 8 at the [facility]. No changes ot her regimen. She has been doing her mounjaro every 2 weeks and tolerating well.   She been able to get her infusions for her gout over the last month.  She had some issues with her Medicare and they should be correcting it so hopefully next month she can get back in.  But it has made a big difference when she is able to get her infusions with her gout pain.    ROS    Objective:      BP 107/61   Pulse 88   Ht 5\' 4"  (1.626 m)   Wt 136 lb 3.2 oz (61.8 kg)   SpO2 97%   BMI 23.38 kg/m    Physical Exam Vitals and nursing note reviewed.  Constitutional:      Appearance: Normal appearance.  HENT:     Head: Normocephalic and atraumatic.  Eyes:     Conjunctiva/sclera: Conjunctivae normal.  Cardiovascular:     Rate and Rhythm: Normal rate and regular rhythm.  Pulmonary:     Effort: Pulmonary effort is normal.     Breath sounds: Normal breath sounds.  Skin:    General: Skin is warm and dry.  Neurological:     Mental Status: She is alert.  Psychiatric:        Mood and Affect: Mood normal.      Results for orders placed or performed in visit on 09/06/23  Iron, TIBC and Ferritin Panel  Result Value Ref  Range   Iron 87    TIBC 304    UIBC 217    %SAT 29   CBC and differential  Result Value Ref Range   Hemoglobin 10.2 (A) 12.0 - 16.0   HCT 33 (A) 36 - 46   Neutrophils Absolute 38.00    Platelets 192 150 - 400 K/uL   WBC 6.9   CBC  Result Value Ref Range   RBC 4.15 3.87 - 5.11  Basic metabolic panel with GFR  Result Value Ref Range   Glucose 100    BUN 36 (A) 4 - 21   CO2 19 13 - 22   Creatinine 1.7 (A) 0.5 - 1.1   Potassium 5.2 (A) 3.5 - 5.1 mEq/L   Sodium 141 137 - 147   Chloride 105 99 - 108  Comprehensive metabolic panel with GFR  Result Value Ref Range   Globulin 2.4    eGFR 31    Calcium 9.4 8.7 - 10.7   Albumin 4.3 3.5 - 5.0  Lipid panel  Result Value Ref Range   Triglycerides 150 40 - 160   Cholesterol  143 0 - 200   HDL 56 35 - 70   LDL Cholesterol 62   Hepatic function panel  Result Value Ref Range   ALT 11 7 - 35 U/L   AST 23 13 - 35   Bilirubin, Total 0.3   Hemoglobin A1c  Result Value Ref Range   Hemoglobin A1C 6.7   TSH  Result Value Ref Range   TSH 0.77 0.41 - 5.90  POCT URINALYSIS DIP (CLINITEK)  Result Value Ref Range   Color, UA yellow yellow   Clarity, UA clear clear   Glucose, UA negative negative mg/dL   Bilirubin, UA negative negative   Ketones, POC UA negative negative mg/dL   Spec Grav, UA 1.610 9.604 - 1.025   Blood, UA negative negative   pH, UA 5.5 5.0 - 8.0   POC PROTEIN,UA negative negative, trace   Urobilinogen, UA 0.2 0.2 or 1.0 E.U./dL   Nitrite, UA Negative Negative   Leukocytes, UA Negative Negative      The 10-year ASCVD risk score (Arnett DK, et al., 2019) is: 25.2%* (Cholesterol units were assumed)    Assessment & Plan:   Problem List Items Addressed This Visit       Cardiovascular and Mediastinum   HTN (hypertension) - Primary   BP looks awesome!!!!        Endocrine   Type 2 diabetes mellitus with stage 4 chronic kidney disease (HCC)   Recently followed up with endocrinology.  They recommended  continuing the Lantus  and the Mounjaro.        Other   Precordial chest pain   She did let me know she ended up taking her nitroglycerin  when she got really stressed when she found out that her insurance was not going to cover some of the infusions because the Medicare had been changed but she has been able to get that corrected so they should back pay those medical bills.      Failed back surgical syndrome   Relevant Medications   oxyCODONE  ER (XTAMPZA  ER) 27 MG C12A   oxyCODONE  ER (XTAMPZA  ER) 27 MG C12A (Start on 10/04/2023)   oxyCODONE  ER (XTAMPZA  ER) 27 MG C12A (Start on 11/01/2023)   oxyCODONE  (ROXICODONE ) 15 MG immediate release tablet   Other Relevant Orders   POCT URINALYSIS DIP (CLINITEK) (Completed)   Encounter for chronic pain management   Indication for chronic opioid: Failed back surgical syndrome Medication and dose: Xtampza  ER 27 mg BID.  Increase Oxycodone  to 15 mg 4 times daily # pills per month: 60, 120 Last UDS date: 06/11/23 Opioid Treatment Agreement signed (Y/N): Y Opioid Treatment Agreement last reviewed with patient:  03/12/2023 N[facility] reviewed this encounter (include red flags):  Yes      Relevant Medications   oxyCODONE  ER (XTAMPZA  ER) 27 MG C12A   oxyCODONE  ER (XTAMPZA  ER) 27 MG C12A (Start on 10/04/2023)   oxyCODONE  ER (XTAMPZA  ER) 27 MG C12A (Start on 11/01/2023)   oxyCODONE  (ROXICODONE ) 15 MG immediate release tablet   Other Visit Diagnoses       Other chronic back pain       Relevant Medications   oxyCODONE  ER (XTAMPZA  ER) 27 MG C12A   oxyCODONE  ER (XTAMPZA  ER) 27 MG C12A (Start on 10/04/2023)   oxyCODONE  ER (XTAMPZA  ER) 27 MG C12A (Start on 11/01/2023)   oxyCODONE  (ROXICODONE ) 15 MG immediate release tablet      She also wanted to make sure that her kidneys were okay so we did  discuss getting a urine since she was having some more right flank pain lately.  Recent CMP in the system from April is up-to-date and stable.  Return in about 3 months  (around 12/07/2023) for chronic pain management, Hypertension.    Duaine German, MD

## 2023-09-06 NOTE — Assessment & Plan Note (Addendum)
 Indication for chronic opioid: Failed back surgical syndrome Medication and dose: Xtampza  ER 27 mg BID.  Increase Oxycodone  to 15 mg 4 times daily # pills per month: 60, 120 Last UDS date: 06/11/23 Opioid Treatment Agreement signed (Y/N): Y Opioid Treatment Agreement last reviewed with patient:  03/12/2023 NCCSRS reviewed this encounter (include red flags):  Yes

## 2023-09-06 NOTE — Assessment & Plan Note (Signed)
BP looks awesome!!!

## 2023-10-17 ENCOUNTER — Encounter: Payer: Self-pay | Admitting: Family Medicine

## 2023-10-17 DIAGNOSIS — G8929 Other chronic pain: Secondary | ICD-10-CM

## 2023-10-17 DIAGNOSIS — M961 Postlaminectomy syndrome, not elsewhere classified: Secondary | ICD-10-CM

## 2023-10-18 MED ORDER — OXYCODONE HCL 15 MG PO TABS: 15.0000 mg | ORAL_TABLET | Freq: Four times a day (QID) | ORAL | 0 refills | Status: DC | PRN

## 2023-10-18 NOTE — Telephone Encounter (Signed)
 Dr. Alvan I apologize- I had seen the oxycodone  ER and patient was referring to IR - no refills showing for the IR. Requesting rx rf of  Oxycodone  15mg  IR  Last written 05/162025 Last OV 09/06/2023 Upcoming appt 12/05/2023

## 2023-10-21 ENCOUNTER — Other Ambulatory Visit: Payer: Self-pay | Admitting: Family Medicine

## 2023-10-21 DIAGNOSIS — G8929 Other chronic pain: Secondary | ICD-10-CM

## 2023-10-21 DIAGNOSIS — M961 Postlaminectomy syndrome, not elsewhere classified: Secondary | ICD-10-CM

## 2023-10-21 MED ORDER — OXYCODONE HCL 15 MG PO TABS: 15.0000 mg | ORAL_TABLET | Freq: Four times a day (QID) | ORAL | 0 refills | Status: DC | PRN

## 2023-12-05 ENCOUNTER — Ambulatory Visit: Admitting: Family Medicine

## 2023-12-06 ENCOUNTER — Other Ambulatory Visit: Payer: Self-pay | Admitting: *Deleted

## 2023-12-06 DIAGNOSIS — M961 Postlaminectomy syndrome, not elsewhere classified: Secondary | ICD-10-CM

## 2023-12-06 DIAGNOSIS — M25551 Pain in right hip: Secondary | ICD-10-CM

## 2023-12-06 DIAGNOSIS — G8929 Other chronic pain: Secondary | ICD-10-CM

## 2023-12-06 MED ORDER — DICLOFENAC SODIUM 1 % EX GEL: 4.0000 g | Freq: Four times a day (QID) | CUTANEOUS | 4 refills | Status: DC

## 2023-12-06 MED ORDER — OXYCODONE HCL 15 MG PO TABS: 15.0000 mg | ORAL_TABLET | Freq: Four times a day (QID) | ORAL | 0 refills | Status: DC | PRN

## 2023-12-06 MED ORDER — NARCAN 4 MG/0.1ML NA LIQD: 1.0000 | Freq: Once | NASAL | 99 refills | Status: AC

## 2023-12-06 NOTE — Telephone Encounter (Signed)
 Pt called requesting a refill of her  pain medication and also prescription for Volarten gel.   She [facility] [address] [facility] and the Volarten gel be sent to [facility].   Volarten gel sent.   Will fwd to pcp for review of oral pain medications

## 2023-12-10 ENCOUNTER — Ambulatory Visit (INDEPENDENT_AMBULATORY_CARE_PROVIDER_SITE_OTHER): Admitting: Family Medicine

## 2023-12-10 VITALS — BP 132/52 | HR 75 | Ht 64.0 in | Wt 137.0 lb

## 2023-12-10 DIAGNOSIS — R202 Paresthesia of skin: Secondary | ICD-10-CM

## 2023-12-10 DIAGNOSIS — E1122 Type 2 diabetes mellitus with diabetic chronic kidney disease: Secondary | ICD-10-CM

## 2023-12-10 DIAGNOSIS — I10 Essential (primary) hypertension: Secondary | ICD-10-CM

## 2023-12-10 DIAGNOSIS — M25561 Pain in right knee: Secondary | ICD-10-CM

## 2023-12-10 DIAGNOSIS — E038 Other specified hypothyroidism: Secondary | ICD-10-CM

## 2023-12-10 DIAGNOSIS — R200 Anesthesia of skin: Secondary | ICD-10-CM

## 2023-12-10 DIAGNOSIS — Z794 Long term (current) use of insulin: Secondary | ICD-10-CM | POA: Diagnosis not present

## 2023-12-10 DIAGNOSIS — N184 Chronic kidney disease, stage 4 (severe): Secondary | ICD-10-CM

## 2023-12-10 DIAGNOSIS — G8929 Other chronic pain: Secondary | ICD-10-CM | POA: Diagnosis not present

## 2023-12-10 DIAGNOSIS — M961 Postlaminectomy syndrome, not elsewhere classified: Secondary | ICD-10-CM | POA: Diagnosis not present

## 2023-12-10 DIAGNOSIS — M25562 Pain in left knee: Secondary | ICD-10-CM

## 2023-12-10 DIAGNOSIS — M25551 Pain in right hip: Secondary | ICD-10-CM

## 2023-12-10 LAB — POCT UA - MICROALBUMIN
Albumin/Creatinine Ratio, Urine, POC: <30
Creatinine, POC: 300 mg/dL
Microalbumin Ur, POC: 30 mg/L

## 2023-12-10 MED ORDER — XTAMPZA ER 27 MG PO C12A: 1.0000 | EXTENDED_RELEASE_CAPSULE | Freq: Two times a day (BID) | ORAL | 0 refills | Status: DC

## 2023-12-10 MED ORDER — OXYCODONE HCL 10 MG PO TABS: 10.0000 mg | ORAL_TABLET | Freq: Four times a day (QID) | ORAL | 0 refills | Status: DC

## 2023-12-10 MED ORDER — TRIAMCINOLONE 0.1 % CREAM:EUCERIN CREAM 1:1: 1.0000 | TOPICAL_CREAM | Freq: Two times a day (BID) | CUTANEOUS | 99 refills | Status: AC | PRN

## 2023-12-10 MED ORDER — DICLOFENAC SODIUM 1 % EX GEL: 4.0000 g | Freq: Four times a day (QID) | CUTANEOUS | 4 refills | Status: AC

## 2023-12-10 NOTE — Progress Notes (Signed)
 Established Patient Office Visit  Subjective  Patient ID: Barbara Yu, female    DOB: Jul 17, 1945  Age: 78 y.o. MRN: [id number]  Chief Complaint  Patient presents with   Pain Management    HPI  3 mo f/u chronic pain management -she reports that she is doing well overall.  We have had a lot of rain over the last several weeks and that does seem to exacerbate her pain but otherwise she is tolerating her medications well she was having some constipation issues but feels like that is buttock on track and she is doing okay she has got a good regimen going.  She does use the heating pad on her low back.  She still been struggling with her gout even though she has been getting the infusions she feels like she been having a lot more pain in her hands.  She also reports a lot of numbness and tingling in both hands sometimes the most of burning sensation so she also wonders if it is neuropathy.  It can sometimes wake her up at night.  During the daytime it can come and go.  She does not feel like she is doing any activities that actually exacerbated or cause it.  She would like a refill on the Voltaren  gel she does use that on her hands occasionally    ROS    Objective:     BP (!) 132/52   Pulse 75   Ht 5' 4 (1.626 m)   Wt 137 lb (62.1 kg)   SpO2 100%   BMI 23.52 kg/m    Physical Exam Vitals and nursing note reviewed.  Constitutional:      Appearance: Normal appearance.  HENT:     Head: Normocephalic and atraumatic.  Eyes:     Conjunctiva/sclera: Conjunctivae normal.  Cardiovascular:     Rate and Rhythm: Normal rate and regular rhythm.  Pulmonary:     Effort: Pulmonary effort is normal.     Breath sounds: Normal breath sounds.  Skin:    General: Skin is warm and dry.  Neurological:     Mental Status: She is alert.  Psychiatric:        Mood and Affect: Mood normal.      Results for orders placed or performed in visit on 12/10/23  POCT UA - Microalbumin  Result  Value Ref Range   Microalbumin Ur, POC 30 mg/L   Creatinine, POC 300 mg/dL   Albumin/Creatinine Ratio, Urine, POC <30       The 10-year ASCVD risk score (Arnett DK, et al., 2019) is: 31.1%* (Cholesterol units were assumed)    Assessment & Plan:   Problem List Items Addressed This Visit       Cardiovascular and Mediastinum   HTN (hypertension)   Blood pressure looks fantastic today continue current regimen.      Relevant Medications   pravastatin  (PRAVACHOL ) 10 MG tablet     Endocrine   Type 2 diabetes mellitus with stage 4 chronic kidney disease (HCC)   Her last A1c with endocrinology looked great A1c was 6.2 in July.  She is actually spacing her Mounjaro to every 10 days because of decreased appetite and that seems to be working really well she rarely gives her insulin  except for maybe every couple of weeks and even then she usually only gives maybe 3 to 4 units.      Relevant Medications   pravastatin  (PRAVACHOL ) 10 MG tablet   tirzepatide (MOUNJARO) 5 MG/0.5ML Pen  Other Relevant Orders   POCT UA - Microalbumin (Completed)   Hypothyroidism   He just had her thyroid checked with endocrine in July TSH looked perfect.        Other   Pain in right hip   Relevant Medications   diclofenac  Sodium (VOLTAREN ) 1 % GEL   Failed back surgical syndrome   Relevant Medications   oxyCODONE  ER (XTAMPZA  ER) 27 MG C12A   oxyCODONE  ER (XTAMPZA  ER) 27 MG C12A (Start on 01/07/2024)   oxyCODONE  ER (XTAMPZA  ER) 27 MG C12A (Start on 02/04/2024)   Oxycodone  HCl 10 MG TABS (Start on 01/07/2024)   Oxycodone  HCl 10 MG TABS (Start on 02/04/2024)   Oxycodone  HCl 10 MG TABS   Encounter for chronic pain management - Primary   Indication for chronic opioid: Failed back surgical syndrome Medication and dose: Xtampza  ER 27 mg BID. Oxycodone  to 15 mg 4 times daily # pills per month: 60, 120 Last UDS date: 12/10/23 Opioid Treatment Agreement signed (Y/N): Y Opioid Treatment Agreement last  reviewed with patient:  03/12/2023 NCCSRS reviewed this encounter (include red flags):  Yes      Relevant Medications   oxyCODONE  ER (XTAMPZA  ER) 27 MG C12A   oxyCODONE  ER (XTAMPZA  ER) 27 MG C12A (Start on 01/07/2024)   oxyCODONE  ER (XTAMPZA  ER) 27 MG C12A (Start on 02/04/2024)   Oxycodone  HCl 10 MG TABS (Start on 01/07/2024)   Oxycodone  HCl 10 MG TABS (Start on 02/04/2024)   Oxycodone  HCl 10 MG TABS   Other Relevant Orders   Pain Management Screening Profile (10S)   Other Visit Diagnoses       Chronic pain of both knees       Relevant Medications   gabapentin (NEURONTIN) 100 MG capsule   clonazePAM (KLONOPIN) 1 MG tablet   oxyCODONE  ER (XTAMPZA  ER) 27 MG C12A   oxyCODONE  ER (XTAMPZA  ER) 27 MG C12A (Start on 01/07/2024)   oxyCODONE  ER (XTAMPZA  ER) 27 MG C12A (Start on 02/04/2024)   diclofenac  Sodium (VOLTAREN ) 1 % GEL   Oxycodone  HCl 10 MG TABS (Start on 01/07/2024)   Oxycodone  HCl 10 MG TABS (Start on 02/04/2024)   Oxycodone  HCl 10 MG TABS     Numbness and tingling in both hands       Relevant Orders   Vitamin B1   B12   Vitamin B6   TSH   Copper , serum       For numbness tingling and burning sensation in both hands.  We did discuss that there could be several different causes including deficiency so we will check for those today.  There is also the possibility of carpal tunnel she has never been diagnosed with that before and she is not doing any particular repetitive activities that should be triggering or causing it.  It could be also coming from her cervical spine especially since the pain and discomfort is bilateral.  We did discuss a trial of a cock up splint at least on one wrist just wearing it at night for the next few weeks just to see if it is helpful.  Return in about 3 months (around 03/11/2024) for Chronic Pain Mgt.    Dorothyann Byars, MD

## 2023-12-10 NOTE — Assessment & Plan Note (Signed)
 Indication for chronic opioid: Failed back surgical syndrome Medication and dose: Xtampza  ER 27 mg BID. Oxycodone  to 15 mg 4 times daily # pills per month: 60, 120 Last UDS date: 12/10/23 Opioid Treatment Agreement signed (Y/N): Y Opioid Treatment Agreement last reviewed with patient:  03/12/2023 NCCSRS reviewed this encounter (include red flags):  Yes

## 2023-12-10 NOTE — Assessment & Plan Note (Addendum)
 Her last A1c with endocrinology looked great A1c was 6.2 in July.  She is actually spacing her Mounjaro to every 10 days because of decreased appetite and that seems to be working really well she rarely gives her insulin  except for maybe every couple of weeks and even then she usually only gives maybe 3 to 4 units.

## 2023-12-10 NOTE — Assessment & Plan Note (Signed)
 He just had her thyroid checked with endocrine in July TSH looked perfect.

## 2023-12-10 NOTE — Assessment & Plan Note (Signed)
Blood pressure looks fantastic today continue current regimen.

## 2023-12-11 LAB — PMP SCREEN PROFILE (10S), URINE
Amphetamine Scrn, Ur: NEGATIVE ng/mL
BARBITURATE SCREEN URINE: NEGATIVE ng/mL
BENZODIAZEPINE SCREEN, URINE: NEGATIVE ng/mL
CANNABINOIDS UR QL SCN: NEGATIVE ng/mL
Cocaine (Metab) Scrn, Ur: NEGATIVE ng/mL
Creatinine(Crt), U: 298.9 mg/dL (ref 20.0–300.0)
Methadone Screen, Urine: NEGATIVE ng/mL
OXYCODONE+OXYMORPHONE UR QL SCN: POSITIVE ng/mL — AB
Opiate Scrn, Ur: POSITIVE ng/mL — AB
Ph of Urine: 5 (ref 4.5–8.9)
Phencyclidine Qn, Ur: NEGATIVE ng/mL
Propoxyphene Scrn, Ur: NEGATIVE ng/mL

## 2023-12-12 ENCOUNTER — Other Ambulatory Visit: Payer: Self-pay | Admitting: *Deleted

## 2023-12-12 DIAGNOSIS — G8929 Other chronic pain: Secondary | ICD-10-CM

## 2023-12-12 DIAGNOSIS — M961 Postlaminectomy syndrome, not elsewhere classified: Secondary | ICD-10-CM

## 2023-12-12 MED ORDER — XTAMPZA ER 27 MG PO C12A: 1.0000 | EXTENDED_RELEASE_CAPSULE | Freq: Two times a day (BID) | ORAL | 0 refills | Status: DC

## 2023-12-12 NOTE — Telephone Encounter (Signed)
 Pt called stating that the Oxycodone  ER 27 mg that was sent on 8/19 [facility]. She was told that they will have this next month when she comes in for her next refill. She was told to try another [facility]S or another pharmacy to see if they have this.   She asked that [facility] be [facility] the [facility] [address] in [place].  [facility] and [facility] to [place] he stated that they do have this in stock. Will send the request to Dr. Alvan for approval.

## 2023-12-16 ENCOUNTER — Ambulatory Visit: Payer: Self-pay | Admitting: Family Medicine

## 2023-12-16 LAB — COPPER, SERUM: Copper: 96 ug/dL (ref 80–158)

## 2023-12-16 LAB — TSH: TSH: 0.838 u[IU]/mL (ref 0.450–4.500)

## 2023-12-16 LAB — VITAMIN B1: Thiamine: 38.5 nmol/L — AB (ref 66.5–200.0)

## 2023-12-16 LAB — VITAMIN B6: Vitamin B6: 19.3 ug/L (ref 3.4–65.2)

## 2023-12-16 LAB — VITAMIN B12: Vitamin B-12: >2000 pg/mL — ABNORMAL HIGH (ref 232–1245)

## 2023-12-16 NOTE — Progress Notes (Signed)
 HI Barbara Yu, your vitamin B 1 is low.  But your B12 is high. I would stop you B12 and I would start B1 over the counter. Plan to recheck B1 in 8 weeks. B6 is normal. Thyroid and copper  levels are normal.

## 2024-01-01 ENCOUNTER — Telehealth: Payer: Self-pay

## 2024-01-02 NOTE — Telephone Encounter (Signed)
 Patient;s son came into office to get FMLA paperwork completed by Dr. Alvan, forms placed in Dr. Claudia box, thanks

## 2024-01-08 NOTE — Telephone Encounter (Signed)
 Forms completed

## 2024-01-09 ENCOUNTER — Encounter: Payer: Self-pay | Admitting: Family Medicine

## 2024-01-09 DIAGNOSIS — G8929 Other chronic pain: Secondary | ICD-10-CM

## 2024-01-09 DIAGNOSIS — M961 Postlaminectomy syndrome, not elsewhere classified: Secondary | ICD-10-CM

## 2024-01-10 MED ORDER — OXYCODONE HCL 15 MG PO TABS: 15.0000 mg | ORAL_TABLET | Freq: Four times a day (QID) | ORAL | 0 refills | Status: DC | PRN

## 2024-01-10 MED ORDER — OXYCODONE HCL 15 MG PO TABS
15.0000 mg | ORAL_TABLET | Freq: Four times a day (QID) | ORAL | 0 refills | Status: DC | PRN
Start: 2024-03-06 — End: 2024-03-10

## 2024-01-10 NOTE — Telephone Encounter (Signed)
 Forms completed. Given to [employer].

## 2024-02-19 LAB — HEMOGLOBIN A1C: Hemoglobin A1C: 6.6

## 2024-02-23 ENCOUNTER — Other Ambulatory Visit: Payer: Self-pay | Admitting: Cardiology

## 2024-03-03 ENCOUNTER — Ambulatory Visit (INDEPENDENT_AMBULATORY_CARE_PROVIDER_SITE_OTHER)

## 2024-03-03 VITALS — BP 125/62 | HR 87 | Ht 64.0 in | Wt 140.0 lb

## 2024-03-03 DIAGNOSIS — Z0000 Encounter for general adult medical examination without abnormal findings: Secondary | ICD-10-CM | POA: Diagnosis not present

## 2024-03-03 NOTE — Patient Instructions (Signed)
 Ms. Balan,  Thank you for taking the time for your Medicare Wellness Visit. I appreciate your continued commitment to your health goals. Please review the care plan we discussed, and feel free to reach out if I can assist you further.  Please note that Annual Wellness Visits do not include a physical exam. Some assessments may be limited, especially if the visit was conducted virtually. If needed, we may recommend an in-person follow-up with your provider.  Ongoing Care Seeing your primary care provider every 3 to 6 months helps us  monitor your health and provide consistent, personalized care.   Referrals If a referral was made during today's visit and you haven't received any updates within two weeks, please contact the referred provider directly to check on the status.  Recommended Screenings:  Health Maintenance  Topic Date Due   Eye exam for diabetics  08/01/2023   COVID-19 Vaccine (8 - 2025-26 season) 12/23/2023   Hemoglobin A1C  01/29/2024   Medicare Annual Wellness Visit  02/25/2024   Flu Shot  07/21/2024*   Complete foot exam   06/10/2024   Yearly kidney function blood test for diabetes  07/29/2024   Yearly kidney health urinalysis for diabetes  12/09/2024   DTaP/Tdap/Td vaccine (2 - Td or Tdap) 04/30/2027   DEXA scan (bone density measurement)  02/20/2028   Pneumococcal Vaccine for age over 15  Completed   Zoster (Shingles) Vaccine  Completed   Meningitis B Vaccine  Aged Out   Breast Cancer Screening  Discontinued   Colon Cancer Screening  Discontinued   Hepatitis C Screening  Discontinued  *Topic was postponed. The date shown is not the original due date.       03/03/2024    1:23 PM  Advanced Directives  Does Patient Have a Medical Advance Directive? Yes  Type of Estate Agent of [place];Living will  Does patient want to make changes to medical advance directive? No - Guardian declined  Copy of Healthcare Power of Attorney in Chart? No -  copy requested    Vision: Annual vision screenings are recommended for early detection of glaucoma, cataracts, and diabetic retinopathy. These exams can also reveal signs of chronic conditions such as diabetes and high blood pressure.  Dental: Annual dental screenings help detect early signs of oral cancer, gum disease, and other conditions linked to overall health, including heart disease and diabetes.  Please see the attached documents for additional preventive care recommendations.

## 2024-03-03 NOTE — Progress Notes (Signed)
 Subjective:   Barbara Yu is a 78 y.o. female who presents for a Medicare Annual Wellness Visit.  Allergies (verified) Amoxicillin, Atorvastatin, Hydrochlorothiazide, Nsaids, Tramadol, Allopurinol , Metformin and related, Morphine , Omeprazole , Pepcid  [famotidine ], Trulicity  [dulaglutide ], Ciprofloxacin, Gabapentin, and Pantoprazole   History: Past Medical History:  Diagnosis Date   Anemia    Anemia in stage 3 chronic kidney disease (HCC) 02/03/2018   Anemia in stage 4 chronic kidney disease (HCC) 02/03/2018   Angina pectoris 01/11/2020   AR (allergic rhinitis) 10/02/2016   CKD (chronic kidney disease) stage 4, GFR 15-29 ml/min (HCC) 10/05/2016   COVID-19 virus infection 04/11/2022   Diabetes mellitus without complication (HCC)    Dyslipidemia 07/14/2019   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  [facility]    Gastro-esophageal reflux disease without esophagitis 06/03/2012   Gout 10/03/2016   HTN (hypertension) 06/03/2012   Hypercalcemia 03/08/2020   Hyperkalemia, diminished renal excretion 01/05/2019   Hypertension    Hypothyroidism 06/03/2012   Iron deficiency anemia 10/02/2016   Lumbar pseudoarthrosis 06/28/2016   Formatting of this note might be different from the original. Added automatically from request for surgery [id number]   Memory deficit 06/27/2018   MMSE 06/2017: 27/30. Pass is 28.    Nonspecific abnormal electrocardiogram (ECG) (EKG) 07/14/2019   Normal cardiac stress test 06/09/2012   at [facility] normal cardiac stress test (surgery preop); Dr Dixon   Osteoarthritis 10/02/2016   Pain in right hip 01/03/2012   Formatting of this note might be different from the original. Overview:  Right greater than left. MRI done 2013 shows right hip has partial posterior labral tear. Also has component of greater trochanteric bursitis and  had corticosteroid injection with good relief in early July, 2012   Paresthesia of both legs 06/12/2012   Pelvic mass in female 06/24/2020   Formatting of this note might be different from the original.  Added automatically from request for surgery [id number]   Peripheral neuropathy 10/02/2016   Precordial chest pain 07/14/2019   Primary osteoarthritis of both hips 01/03/2012   Right greater than left. MRI done 2013 shows right hip has partial posterior labral tear. Also has component of greater trochanteric bursitis and had corticosteroid injection with good relief in early July, 2012    S/P lumbar fusion 10/16/2012   Thrombocytopenia, unspecified 02/12/2013   Formatting of this note might be different from the original. 10/1 IMO update   Type 2 diabetes mellitus with stage 4 chronic kidney disease (HCC) 06/03/2012   Uncontrolled type 2 diabetes with renal manifestation 10/16/2019   Vitamin D  deficiency    Weakness 06/12/2012   Past Surgical History:  Procedure Laterality Date   BACK SURGERY     Fibroid removed     Family History  Problem Relation Age of Onset   Diabetes Mother    Diabetes Brother    Diabetes Son    Social History   Occupational History   Occupation: retired  Tobacco Use   Smoking status: Former    Current packs/day: 0.00    Types: Cigarettes    Quit date: 09/08/1979    Years since quitting: 44.5   Smokeless tobacco: Never  Substance and Sexual Activity   Alcohol use: No   Drug use: No   Sexual activity: Never   Tobacco Counseling Counseling given: Not Answered  SDOH Screenings   Food Insecurity: No Food Insecurity (03/03/2024)  Housing: Low Risk  (03/03/2024)  Transportation Needs: No Transportation Needs (03/03/2024)  Utilities: Not At Risk (03/03/2024)  Alcohol Screen: Low Risk  (03/01/2024)  Depression (PHQ2-9): Low Risk  (03/03/2024)  Recent Concern: Depression (PHQ2-9) - Medium Risk (12/10/2023)  Financial Resource Strain: Low Risk   (03/01/2024)  Physical Activity: Insufficiently Active (03/03/2024)  Social Connections: Moderately Integrated (03/03/2024)  Recent Concern: Social Connections - Moderately Isolated (03/01/2024)  Stress: No Stress Concern Present (03/03/2024)  Recent Concern: Stress - Stress Concern Present (12/10/2023)  Tobacco Use: Medium Risk (03/03/2024)  Health Literacy: Adequate Health Literacy (03/03/2024)   Depression Screen    03/03/2024    1:43 PM 12/10/2023    3:58 PM 06/11/2023    1:48 PM 02/25/2023    2:34 PM 12/07/2022    1:33 PM 09/04/2022    1:18 PM 06/14/2022   12:04 PM  PHQ 2/9 Scores  PHQ - 2 Score 0 0 0 1 1 0 0  PHQ- 9 Score  5           Data saved with a previous flowsheet row definition     Goals Addressed             This Visit's Progress    Patient Stated       Patient states she would like to get her A1c down to 6.1 %.       Visit info / Clinical Intake: Medicare Wellness Visit Type:: Subsequent Annual Wellness Visit Persons participating in visit:: patient Medicare Wellness Visit Mode:: In-person (required for WTM) Information given by:: patient Interpreter Needed?: No Pre-visit prep was completed: yes AWV questionnaire completed by patient prior to visit?: yes Date:: 03/01/24 Living arrangements:: (!) lives alone Patient's Overall Health Status Rating: good Typical amount of pain: some Does pain affect daily life?: (!) yes Are you currently prescribed opioids?: (!) yes  Dietary Habits and Nutritional Risks How many meals a day?: (!) 1 Eats fruit and vegetables daily?: (!) no Most meals are obtained by: preparing own meals In the last 2 weeks, have you had any of the following?: (!) nausea, vomiting, diarrhea Diabetic:: (!) yes Any non-healing wounds?: no How often do you check your BS?: continuous glucose monitor Would you like to be referred to a Nutritionist or for Diabetic Management? : no  Functional Status Activities of Daily Living (to include  ambulation/medication): Independent Ambulation: Independent Medication Administration: Needs assistance (comment) (daughter helps) Home Management: Independent Manage your own finances?: (!) no Primary transportation is: driving Concerns about vision?: no *vision screening is required for WTM* Concerns about hearing?: no  Fall Screening Falls in the past year?: 0 Number of falls in past year: 0 Was there an injury with Fall?: 0 Fall Risk Category Calculator: 0 Patient Fall Risk Level: Low Fall Risk  Fall Risk Patient at Risk for Falls Due to: No Fall Risks Fall risk Follow up: Falls evaluation completed  Home and Transportation Safety: All rugs have non-skid backing?: yes All stairs or steps have railings?: yes Grab bars in the bathtub or shower?: yes Have non-skid surface in bathtub or shower?: yes Good home lighting?: yes Regular seat belt use?: yes Hospital stays in the last year:: no  Cognitive Assessment Difficulty concentrating, remembering, or making decisions? : yes Will 6CIT or Mini Cog be Completed: yes What year is it?: 0 points What month is it?: 0 points Give patient an address phrase to remember (5 components): [address] [place], [place] [address] About what time is it?: 0 points  Count backwards from 20 to 1: 0 points Say the months of the year in reverse: 0 points Repeat the address phrase from earlier: 6 points 6 CIT Score: 6 points  Advance Directives (For Healthcare) Does Patient Have a Medical Advance Directive?: Yes Does patient want to make changes to medical advance directive?: No - Guardian declined Type of Advance Directive: Healthcare Power of Reading; Living will Copy of Healthcare Power of Attorney in Chart?: No - copy requested Copy of Living Will in Chart?: No - copy requested  Reviewed/Updated  Reviewed/Updated: Medical History; Family History; Surgical History; Medications; Allergies; Care Teams; Patient Goals        Objective:     Today's Vitals   03/03/24 1302 03/03/24 1350  BP: 130/64 125/62  Pulse: 87   SpO2: 98%   Weight: 140 lb (63.5 kg)   Height: 5' 4 (1.626 m)    Body mass index is 24.03 kg/m.  Current Medications (verified) Outpatient Encounter Medications as of 03/03/2024  Medication Sig   acetaminophen (TYLENOL) 325 MG tablet Take 650 mg by mouth as needed for mild pain or moderate pain.   albuterol  (PROAIR  HFA) 108 (90 Base) MCG/ACT inhaler Inhale 2 puffs into the lungs every 6 (six) hours as needed for wheezing or shortness of breath. USE 2 INHALATIONS EVERY 6 HOURS AS NEEDED   allopurinol  (ZYLOPRIM ) 300 MG tablet Take 1 tablet (300 mg total) by mouth daily.   amLODipine  (NORVASC ) 5 MG tablet TAKE 1 TABLET DAILY   azelastine  (ASTELIN ) 0.1 % nasal spray USE 2 SPRAYS IN EACH NOSTRIL TWICE A DAY AS NEEDED FOR RHINITIS AS DIRECTED   ciclopirox (PENLAC) 8 % solution Apply topically.   clonazePAM (KLONOPIN) 1 MG tablet Take 1 mg by mouth.   colchicine  0.6 MG tablet Take 0.6 mg by mouth.   diclofenac  Sodium (VOLTAREN ) 1 % GEL Apply 4 g topically 4 (four) times daily.   estradiol  (ESTRACE ) 0.1 MG/GM vaginal cream Place 1 Applicatorful vaginally at bedtime. X 2 weeks and then twice a week.   folic acid (FOLVITE) 1 MG tablet Take 1 mg by mouth daily.   gabapentin (NEURONTIN) 100 MG capsule Take 100 mg by mouth daily.   glucose blood (FREESTYLE LITE) test strip For testing up to 4 times daily. DX: E11.9   insulin  glargine (LANTUS  SOLOSTAR) 100 UNIT/ML Solostar Pen Inject 15-25 Units into the skin daily.   isosorbide  mononitrate (IMDUR ) 120 MG 24 hr tablet Take 1 tablet (120 mg total) by mouth daily.   metoprolol  succinate (TOPROL -XL) 50 MG 24 hr tablet TAKE ONE AND ONE-HALF TABLETS DAILY   nitroGLYCERIN  (NITROSTAT ) 0.4 MG SL tablet Place 1 tablet (0.4 mg total) under the tongue every 5 (five) minutes as needed for chest pain.   oxyCODONE  (ROXICODONE ) 15 MG immediate release tablet Take 1 tablet (15 mg total)  by mouth every 6 (six) hours as needed for pain.   [START ON 03/06/2024] oxyCODONE  (ROXICODONE ) 15 MG immediate release tablet Take 1 tablet (15 mg total) by mouth every 6 (six) hours as needed for pain.   oxyCODONE  (ROXICODONE ) 15 MG immediate release tablet Take 1 tablet (15 mg total) by mouth every 6 (six) hours as needed for pain.   oxyCODONE  (ROXICODONE ) 15 MG immediate release tablet Take 1 tablet (15 mg total) by mouth every 6 (six) hours as needed for pain.   oxyCODONE  ER (XTAMPZA  ER) 27 MG C12A Take 1 capsule by mouth in the morning and at bedtime.   oxyCODONE  ER (XTAMPZA   ER) 27 MG C12A Take 1 capsule by mouth in the morning and at bedtime.   oxyCODONE  ER (XTAMPZA  ER) 27 MG C12A Take 1 capsule by mouth in the morning and at bedtime.   Pegloticase (KRYSTEXXA IV) Inject into the vein. Iv infusion   pravastatin  (PRAVACHOL ) 10 MG tablet Take 10 mg by mouth at bedtime.   SURE COMFORT PEN NEEDLES 31G X 8 MM MISC USE WITH INSULIN  THREE TIMES A DAY   SYNTHROID  50 MCG tablet TAKE 1 TABLET TWO TIMES A WEEK. ON SUNDAY AND WEDNESDAY. ALTERNATING WITH 75 MCG THE OTHER 5 DAYS   tirzepatide (MOUNJARO) 5 MG/0.5ML Pen Inject 5 mg into the skin once a week.   tolterodine  (DETROL  LA) 4 MG 24 hr capsule Take 1 capsule (4 mg total) by mouth daily.   Triamcinolone  Acetonide (TRIAMCINOLONE  0.1 % CREAM : EUCERIN) CREA Apply 1 Application topically 2 (two) times daily as needed for itching. 1 jar. Mix 1:1   levothyroxine  (SYNTHROID ) 75 MCG tablet Take 1 tablet (75 mcg total) by mouth daily. (Patient not taking: Reported on 03/03/2024)   No facility-administered encounter medications on file as of 03/03/2024.   Hearing/Vision screen No results found. Immunizations and Health Maintenance Health Maintenance  Topic Date Due   OPHTHALMOLOGY EXAM  08/01/2023   COVID-19 Vaccine (8 - 2025-26 season) 12/23/2023   HEMOGLOBIN A1C  01/29/2024   Influenza Vaccine  07/21/2024 (Originally 11/22/2023)   FOOT EXAM   06/10/2024   Diabetic kidney evaluation - eGFR measurement  07/29/2024   Diabetic kidney evaluation - Urine ACR  12/09/2024   Medicare Annual Wellness (AWV)  03/03/2025   DTaP/Tdap/Td (2 - Td or Tdap) 04/30/2027   DEXA SCAN  02/20/2028   Pneumococcal Vaccine: 50+ Years  Completed   Zoster Vaccines- Shingrix   Completed   Meningococcal B Vaccine  Aged Out   Mammogram  Discontinued   Colonoscopy  Discontinued   Hepatitis C Screening  Discontinued        Assessment/Plan:  This is a routine wellness examination for Barbara Yu.  Patient Care Team: Alvan Dorothyann BIRCH, MD as PCP - General (Family Medicine) Bernie Lamar PARAS, MD as PCP - Cardiology (Cardiology) Erich Glendia Hacker, MD as Referring Physician (Nephrology) Comer Rexene BROCKS, DPM as Referring Physician (Podiatry)  I have personally reviewed and noted the following in the patient's chart:   Medical and social history Use of alcohol, tobacco or illicit drugs  Current medications and supplements including opioid prescriptions. Functional ability and status Nutritional status Physical activity Advanced directives List of other physicians Hospitalizations, surgeries, and ER visits in previous 12 months Vitals Screenings to include cognitive, depression, and falls Referrals and appointments  No orders of the defined types were placed in this encounter.  In addition, I have reviewed and discussed with patient certain preventive protocols, quality metrics, and best practice recommendations. A written personalized care plan for preventive services as well as general preventive health recommendations were provided to patient.   Barbara Yu, CMA   03/03/2024   Return in 1 year (on 03/03/2025).  After Visit Summary: (In Person-Printed) AVS printed and given to the patient  Nurse Notes:   Barbara Yu is a 78 y.o. female patient of Metheney, Dorothyann BIRCH, MD who had a Medicare Annual Wellness Visit today via  telephone. Barbara Yu is Retired and lives alone. She has 5 children. She reports that she is socially active and does interact with friends/family regularly. She is moderately physically active and enjoys watching television and going  to church.

## 2024-03-09 ENCOUNTER — Encounter: Payer: Self-pay | Admitting: Family Medicine

## 2024-03-10 ENCOUNTER — Ambulatory Visit (INDEPENDENT_AMBULATORY_CARE_PROVIDER_SITE_OTHER): Admitting: Family Medicine

## 2024-03-10 ENCOUNTER — Encounter: Payer: Self-pay | Admitting: Family Medicine

## 2024-03-10 VITALS — BP 121/74 | HR 87 | Ht 64.0 in | Wt 140.0 lb

## 2024-03-10 DIAGNOSIS — M961 Postlaminectomy syndrome, not elsewhere classified: Secondary | ICD-10-CM

## 2024-03-10 DIAGNOSIS — Z794 Long term (current) use of insulin: Secondary | ICD-10-CM

## 2024-03-10 DIAGNOSIS — N184 Chronic kidney disease, stage 4 (severe): Secondary | ICD-10-CM

## 2024-03-10 DIAGNOSIS — G8929 Other chronic pain: Secondary | ICD-10-CM | POA: Diagnosis not present

## 2024-03-10 DIAGNOSIS — E1122 Type 2 diabetes mellitus with diabetic chronic kidney disease: Secondary | ICD-10-CM

## 2024-03-10 DIAGNOSIS — K219 Gastro-esophageal reflux disease without esophagitis: Secondary | ICD-10-CM

## 2024-03-10 DIAGNOSIS — M1A0790 Idiopathic chronic gout, unspecified ankle and foot, without tophus (tophi): Secondary | ICD-10-CM

## 2024-03-10 MED ORDER — XTAMPZA ER 27 MG PO C12A: 1.0000 | EXTENDED_RELEASE_CAPSULE | Freq: Two times a day (BID) | ORAL | 0 refills | Status: AC

## 2024-03-10 MED ORDER — OXYCODONE HCL 15 MG PO TABS: 15.0000 mg | ORAL_TABLET | Freq: Four times a day (QID) | ORAL | 0 refills | Status: AC | PRN

## 2024-03-10 MED ORDER — OXYCODONE HCL 15 MG PO TABS
15.0000 mg | ORAL_TABLET | Freq: Four times a day (QID) | ORAL | 0 refills | Status: AC | PRN
Start: 2024-03-10 — End: ?

## 2024-03-10 MED ORDER — XTAMPZA ER 27 MG PO C12A
1.0000 | EXTENDED_RELEASE_CAPSULE | Freq: Two times a day (BID) | ORAL | 0 refills | Status: AC
Start: 2024-03-10 — End: ?

## 2024-03-10 NOTE — Assessment & Plan Note (Addendum)
 Last A1C looks great!!! Doing well on Mounjaro.  Follows with endocrine.  Type 2 diabetes is well-controlled with recent A1c levels of 6.5% in October and 6.4% today. - Continue current diabetes management plan.

## 2024-03-10 NOTE — Progress Notes (Signed)
 Established Patient Office Visit  Patient ID: Barbara Yu, female    DOB: [birth year]  Age: 78 y.o. MRN: [id number] PCP: Alvan Dorothyann BIRCH, MD  Chief Complaint  Patient presents with   Pain Management    Subjective:     HPI  Discussed the use of AI scribe software for clinical note transcription with the patient, who gave verbal consent to proceed.  History of Present Illness Barbara Yu is an 78 year old female with gout and diabetes who presents for a follow-up visit.  Gout management - Missed scheduled infusion due to late appointment time; infusion typically takes two hours - Plans to reschedule infusion - Infusion cost is $35,000 per visit  Diabetes mellitus control - Glycemic control is stable - Last hemoglobin A1c was 6.4, improved from 6.5 in October - Satisfied with current diabetes management  Chest pain - Occasional chest pain, particularly after missing one or two days of walking - Recent episode of chest pain occurred while walking uphill, resolved after stopping to catch breath  Knee pain, right  - Experiences knee pain - Considering gel injections, aware that insurance approval is required - Concerned about steroid injections causing hair loss  Bowel function - No constipation or bowel issues - Bowel movements are regular   Eye exam scheduled for Feb.     ROS    Objective:     BP 121/74   Pulse 87   Ht 5' 4 (1.626 m)   Wt 140 lb (63.5 kg)   SpO2 99%   BMI 24.03 kg/m    Physical Exam Vitals and nursing note reviewed.  Constitutional:      Appearance: Normal appearance.  HENT:     Head: Normocephalic and atraumatic.  Eyes:     Conjunctiva/sclera: Conjunctivae normal.  Cardiovascular:     Rate and Rhythm: Normal rate and regular rhythm.  Pulmonary:     Effort: Pulmonary effort is normal.     Breath sounds: Normal breath sounds.  Skin:    General: Skin is warm and dry.  Neurological:     Mental Status: She is  alert.  Psychiatric:        Mood and Affect: Mood normal.      No results found for any visits on 03/10/24.    The ASCVD Risk score (Arnett DK, et al., 2019) failed to calculate for the following reasons:   The 2019 ASCVD risk score is only valid for ages 61 to 18    Assessment & Plan:   Problem List Items Addressed This Visit       Digestive   Gastro-esophageal reflux disease without esophagitis - Primary     Endocrine   Type 2 diabetes mellitus with stage 4 chronic kidney disease (HCC)   Last A1C looks great!!! Doing well on Mounjaro.  Follows with endocrine.  Type 2 diabetes is well-controlled with recent A1c levels of 6.5% in October and 6.4% today. - Continue current diabetes management plan.        Other   Gout   Gout Missed scheduled infusion due to timing conflict. - Reschedule gout infusion appointment.      Failed back surgical syndrome   Relevant Medications   oxyCODONE  (ROXICODONE ) 15 MG immediate release tablet   oxyCODONE  (ROXICODONE ) 15 MG immediate release tablet (Start on 04/07/2024)   oxyCODONE  (ROXICODONE ) 15 MG immediate release tablet (Start on 05/05/2024)   oxyCODONE  ER (XTAMPZA  ER) 27 MG C12A   oxyCODONE  ER (XTAMPZA  ER) 27 MG C12A (Start on  04/07/2024)   oxyCODONE  ER (XTAMPZA  ER) 27 MG C12A (Start on 05/05/2024)   Encounter for chronic pain management   Indication for chronic opioid: Failed back surgical syndrome Medication and dose: Xtampza  ER 27 mg BID. Oxycodone  to 15 mg 4 times daily # pills per month: 60, 120 Last UDS date: 12/10/23 Opioid Treatment Agreement signed (Y/N): Y Opioid Treatment Agreement last reviewed with patient:  03/10/24 NCCSRS reviewed this encounter (include red flags):  Yes      Relevant Medications   oxyCODONE  (ROXICODONE ) 15 MG immediate release tablet   oxyCODONE  (ROXICODONE ) 15 MG immediate release tablet (Start on 04/07/2024)   oxyCODONE  (ROXICODONE ) 15 MG immediate release tablet (Start on 05/05/2024)    oxyCODONE  ER (XTAMPZA  ER) 27 MG C12A   oxyCODONE  ER (XTAMPZA  ER) 27 MG C12A (Start on 04/07/2024)   oxyCODONE  ER (XTAMPZA  ER) 27 MG C12A (Start on 05/05/2024)    Assessment and Plan Assessment & Plan Chronic pain Management is ongoing with current medication regimen. - Refilled pain medications.  Angina pectoris Intermittent chest pain likely due to angina, possibly related to blood vessel spasms rather than blockages. Symptoms improve after stopping and catching breath. - Carry nitroglycerin  tablets for use if chest pain persists after rest.  Knee pain, Right Management may involve steroid or gel injections, depending on insurance approval and previous treatment history. Gel injections can reduce pain and increase function, but are not permanent. - Will consider steroid injection if not recently administered. - Will pursue insurance approval for gel injections if needed. - Will consider x-ray of knee if indicated.   Return in about 3 months (around 06/10/2024) for pain management.    Dorothyann Byars, MD [facility] at [facility]

## 2024-03-10 NOTE — Assessment & Plan Note (Signed)
 Gout Missed scheduled infusion due to timing conflict. - Reschedule gout infusion appointment.

## 2024-03-10 NOTE — Assessment & Plan Note (Signed)
 Indication for chronic opioid: Failed back surgical syndrome Medication and dose: Xtampza  ER 27 mg BID. Oxycodone  to 15 mg 4 times daily # pills per month: 60, 120 Last UDS date: 12/10/23 Opioid Treatment Agreement signed (Y/N): Y Opioid Treatment Agreement last reviewed with patient:  03/10/24 NCCSRS reviewed this encounter (include red flags):  Yes

## 2024-03-10 NOTE — Addendum Note (Signed)
 Addended by: FREYA BASCOM CROME on: 03/10/2024 05:02 PM   Modules accepted: Orders

## 2024-03-13 LAB — DRUG SCREEN, UR (12+OXYCODONE+CRT)
Amphetamine Scrn, Ur: NEGATIVE ng/mL
BARBITURATE SCREEN URINE: NEGATIVE ng/mL
BENZODIAZEPINE SCREEN, URINE: NEGATIVE ng/mL
CANNABINOIDS UR QL SCN: NEGATIVE ng/mL
Cocaine (Metab) Scrn, Ur: NEGATIVE ng/mL
Creatinine(Crt), U: 235.4 mg/dL (ref 20.0–300.0)
Fentanyl, Urine: NEGATIVE pg/mL
Meperidine Screen, Urine: NEGATIVE ng/mL
Methadone Screen, Urine: NEGATIVE ng/mL
OXYCODONE+OXYMORPHONE UR QL SCN: POSITIVE — AB
Opiate Scrn, Ur: NEGATIVE ng/mL
Ph of Urine: 5 (ref 4.5–8.9)
Phencyclidine Qn, Ur: NEGATIVE ng/mL
Propoxyphene Scrn, Ur: NEGATIVE ng/mL
SPECIFIC GRAVITY: 1.015
Tramadol Screen, Urine: NEGATIVE ng/mL

## 2024-06-09 ENCOUNTER — Ambulatory Visit: Admitting: Family Medicine

## 2024-06-18 ENCOUNTER — Ambulatory Visit: Admitting: Cardiology

## 2025-03-04 ENCOUNTER — Ambulatory Visit
